# Patient Record
Sex: Male | Born: 1985 | Hispanic: Yes | Smoking: Former smoker
Health system: Southern US, Community
[De-identification: ages and names within clinical notes are randomized; demographics above are authoritative.]

## PROBLEM LIST (undated history)

## (undated) DIAGNOSIS — E119 Type 2 diabetes mellitus without complications: Secondary | ICD-10-CM

## (undated) HISTORY — PX: MOUTH SURGERY: SHX715

---

## 2012-04-02 ENCOUNTER — Emergency Department (HOSPITAL_COMMUNITY)
Admission: EM | Admit: 2012-04-02 | Discharge: 2012-04-03 | Disposition: A | Discharge status: Home / Self Care | Payer: Self-pay | Attending: Emergency Medicine | Admitting: Emergency Medicine

## 2012-04-02 DIAGNOSIS — R739 Hyperglycemia, unspecified: Secondary | ICD-10-CM

## 2012-04-02 DIAGNOSIS — K0889 Other specified disorders of teeth and supporting structures: Secondary | ICD-10-CM

## 2012-04-02 DIAGNOSIS — K089 Disorder of teeth and supporting structures, unspecified: Secondary | ICD-10-CM | POA: Insufficient documentation

## 2012-04-02 DIAGNOSIS — Z794 Long term (current) use of insulin: Secondary | ICD-10-CM | POA: Insufficient documentation

## 2012-04-02 DIAGNOSIS — R350 Frequency of micturition: Secondary | ICD-10-CM | POA: Insufficient documentation

## 2012-04-02 DIAGNOSIS — F17200 Nicotine dependence, unspecified, uncomplicated: Secondary | ICD-10-CM | POA: Insufficient documentation

## 2012-04-02 DIAGNOSIS — E1169 Type 2 diabetes mellitus with other specified complication: Secondary | ICD-10-CM | POA: Insufficient documentation

## 2012-04-02 HISTORY — DX: Type 2 diabetes mellitus without complications: E11.9

## 2012-04-03 ENCOUNTER — Encounter (HOSPITAL_COMMUNITY): Payer: Self-pay | Admitting: Emergency Medicine

## 2012-04-03 LAB — COMPREHENSIVE METABOLIC PANEL
ALT: 31 U/L (ref 0–53)
AST: 35 U/L (ref 0–37)
Albumin: 4.3 g/dL (ref 3.5–5.2)
Alkaline Phosphatase: 113 U/L (ref 39–117)
GFR calc Af Amer: >90 mL/min (ref >90)
Glucose, Bld: 505 mg/dL — ABNORMAL HIGH (ref 70–99)
Potassium: 4.3 mEq/L (ref 3.5–5.1)
Sodium: 133 mEq/L — ABNORMAL LOW (ref 135–145)
Total Protein: 7.6 g/dL (ref 6.0–8.3)

## 2012-04-03 LAB — CBC
Hemoglobin: 14.1 g/dL (ref 13.0–17.0)
MCHC: 35.5 g/dL (ref 30.0–36.0)

## 2012-04-03 LAB — URINALYSIS, ROUTINE W REFLEX MICROSCOPIC
Bilirubin Urine: NEGATIVE
Glucose, UA: >1000 mg/dL — AB
Hgb urine dipstick: NEGATIVE
Ketones, ur: 40 mg/dL — AB
pH: 6.5 (ref 5.0–8.0)

## 2012-04-03 LAB — GLUCOSE, CAPILLARY
Glucose-Capillary: 305 mg/dL — ABNORMAL HIGH (ref 70–99)
Glucose-Capillary: 481 mg/dL — ABNORMAL HIGH (ref 70–99)
Glucose-Capillary: 484 mg/dL — ABNORMAL HIGH (ref 70–99)

## 2012-04-03 MED ORDER — BUPIVACAINE HCL (PF) 0.5 % IJ SOLN
INTRAMUSCULAR | Status: AC
  Administered 2012-04-03: 50 mg
  Filled 2012-04-03: qty 30

## 2012-04-03 MED ORDER — PENICILLIN V POTASSIUM 500 MG PO TABS: 500.0000 mg | ORAL_TABLET | Freq: Four times a day (QID) | ORAL | Status: AC

## 2012-04-03 MED ORDER — IBUPROFEN 800 MG PO TABS: 800.0000 mg | ORAL_TABLET | Freq: Three times a day (TID) | ORAL | Status: DC

## 2012-04-03 MED ORDER — SODIUM CHLORIDE 0.9 % IV BOLUS (SEPSIS)
1000.0000 mL | Freq: Once | INTRAVENOUS | Status: AC
  Administered 2012-04-03: 1000 mL via INTRAVENOUS

## 2012-04-03 MED ORDER — INSULIN ASPART 100 UNIT/ML ~~LOC~~ SOLN
10.0000 [IU] | Freq: Once | SUBCUTANEOUS | Status: AC
  Administered 2012-04-03: 10 [IU] via SUBCUTANEOUS

## 2012-04-03 MED ORDER — HYDROCODONE-ACETAMINOPHEN 7.5-500 MG/15ML PO SOLN: 15.0000 mL | Freq: Four times a day (QID) | ORAL | Status: DC | PRN

## 2012-04-03 MED ORDER — METFORMIN HCL 500 MG PO TABS: 500.0000 mg | ORAL_TABLET | Freq: Every day | ORAL | Status: DC

## 2012-04-03 NOTE — ED Notes (Signed)
Pt states he has been having sxs of hyperglycemia for the past few days: dry mouth, light headedness increased urination

## 2012-04-03 NOTE — ED Provider Notes (Signed)
History     CSN: [id number]  Arrival date & time 04/02/12  2316   First MD Initiated Contact with Patient 04/03/12 0040      Chief Complaint  Patient presents with  . Hyperglycemia    (Consider location/radiation/quality/duration/timing/severity/associated sxs/prior treatment) HPI Hx per PT. is a diabetic and is out of metformin last month. He presents tonight with elevated blood sugar at home and increased urination. No fevers or chills. No nausea or vomiting. No abdominal pain. Symptoms moderate in severity. No known aggravating or alleviating factors. No local primary care physician. Is from [place]. Past Medical History  Diagnosis Date  . Diabetes mellitus without complication     History reviewed. No pertinent past surgical history.  Family History  Problem Relation Age of Onset  . Stroke Father   . CAD Other     History  Substance Use Topics  . Smoking status: Current Every Day Smoker    Types: Cigarettes  . Smokeless tobacco: Not on file  . Alcohol Use: Yes     Comment: social      Review of Systems  Constitutional: Negative for fever and chills.  HENT: Negative for neck pain and neck stiffness.   Eyes: Negative for pain.  Respiratory: Negative for shortness of breath.   Cardiovascular: Negative for chest pain.  Gastrointestinal: Negative for abdominal pain.  Genitourinary: Positive for frequency. Negative for dysuria and hematuria.  Musculoskeletal: Negative for back pain.  Skin: Negative for rash.  Neurological: Negative for headaches.  All other systems reviewed and are negative.    Allergies  Review of patient's allergies indicates no known allergies.  Home Medications   Current Outpatient Rx  Name  Route  Sig  Dispense  Refill  . INSULIN DETEMIR 100 UNIT/ML [place] SOLN   Subcutaneous   Inject 50 Units into the skin daily.         Marland Kitchen METFORMIN HCL 500 MG PO TABS   Oral   Take 500 mg by mouth daily with breakfast.           BP  123/70  Pulse 65  Temp 98.1 F (36.7 C) (Oral)  Resp 18  SpO2 100%  Physical Exam  Constitutional: He is oriented to person, place, and time. He appears well-developed and well-nourished.  HENT:  Head: Normocephalic and atraumatic.       Left lower first molar tenderness. No trismus. No gingival swelling. Uvula midline. Mildly dry mucous membranes  Eyes: Conjunctivae normal and EOM are normal. Pupils are equal, round, and reactive to light.  Neck: Trachea normal. Neck supple. No thyromegaly present.  Cardiovascular: Normal rate, regular rhythm, S1 normal, S2 normal and normal pulses.     No systolic murmur is present   No diastolic murmur is present  Pulses:      Radial pulses are 2+ on the right side, and 2+ on the left side.  Pulmonary/Chest: Effort normal and breath sounds normal. He has no wheezes. He has no rhonchi. He has no rales. He exhibits no tenderness.  Abdominal: Soft. Normal appearance and bowel sounds are normal. There is no tenderness. There is no CVA tenderness and negative Murphy's sign.  Musculoskeletal:       Moves all extremities x4 without clubbing cyanosis or edema  Neurological: He is alert and oriented to person, place, and time. He has normal strength. No cranial nerve deficit or sensory deficit. GCS eye subscore is 4. GCS verbal subscore is 5. GCS motor subscore is 6.  Skin: Skin is warm and dry. No rash noted. He is not diaphoretic.  Psychiatric: His speech is normal.       Cooperative and appropriate    ED Course  Dental Date/Time: 04/03/2012 2:36 AM Performed by: Sunnie Nielsen Authorized by: Sunnie Nielsen Consent: Verbal consent obtained. Risks and benefits: risks, benefits and alternatives were discussed Consent given by: patient Patient understanding: patient states understanding of the procedure being performed Patient consent: the patient's understanding of the procedure matches consent given Procedure consent: procedure consent matches procedure  scheduled Required items: required blood products, implants, devices, and special equipment available Patient identity confirmed: verbally with patient Time out: Immediately prior to procedure a "time out" was called to verify the correct patient, procedure, equipment, support staff and site/side marked as required. Preparation: Patient was prepped and draped in the usual sterile fashion. Local anesthesia used: yes Anesthesia: local infiltration Local anesthetic: bupivacaine 0.5% without epinephrine Anesthetic total: 1.8 ml Patient sedated: no Patient tolerance: Patient tolerated the procedure well with no immediate complications. Comments: Pain improved   (including critical care time)  Results for orders placed during the hospital encounter of 04/02/12  GLUCOSE, CAPILLARY      Component Value Range   Glucose-Capillary 484 (*) 70 - 99 mg/dL  CBC      Component Value Range   WBC 7.5  4.0 - 10.5 K/uL   RBC 4.80  4.22 - 5.81 MIL/uL   Hemoglobin 14.1  13.0 - 17.0 g/dL   HCT 40.9  81.1 - 91.4 %   MCV 82.7  78.0 - 100.0 fL   MCH 29.4  26.0 - 34.0 pg   MCHC 35.5  30.0 - 36.0 g/dL   RDW 78.2  95.6 - 21.3 %   Platelets 197  150 - 400 K/uL  COMPREHENSIVE METABOLIC PANEL      Component Value Range   Sodium 133 (*) 135 - 145 mEq/L   Potassium 4.3  3.5 - 5.1 mEq/L   Chloride 96  96 - 112 mEq/L   CO2 26  19 - 32 mEq/L   Glucose, Bld 505 (*) 70 - 99 mg/dL   BUN 14  6 - 23 mg/dL   Creatinine, Ser 0.86  0.50 - 1.35 mg/dL   Calcium 9.7  8.4 - 57.8 mg/dL   Total Protein 7.6  6.0 - 8.3 g/dL   Albumin 4.3  3.5 - 5.2 g/dL   AST 35  0 - 37 U/L   ALT 31  0 - 53 U/L   Alkaline Phosphatase 113  39 - 117 U/L   Total Bilirubin 0.5  0.3 - 1.2 mg/dL   GFR calc non Af Amer >90  >90 mL/min   GFR calc Af Amer >90  >90 mL/min  URINALYSIS, ROUTINE W REFLEX MICROSCOPIC      Component Value Range   Color, Urine YELLOW  YELLOW   APPearance CLEAR  CLEAR   Specific Gravity, Urine >1.046 (*) 1.005 -  1.030   pH 6.5  5.0 - 8.0   Glucose, UA >1000 (*) NEGATIVE mg/dL   Hgb urine dipstick NEGATIVE  NEGATIVE   Bilirubin Urine NEGATIVE  NEGATIVE   Ketones, ur 40 (*) NEGATIVE mg/dL   Protein, ur NEGATIVE  NEGATIVE mg/dL   Urobilinogen, UA 0.2  0.0 - 1.0 mg/dL   Nitrite NEGATIVE  NEGATIVE   Leukocytes, UA NEGATIVE  NEGATIVE  URINE MICROSCOPIC-ADD ON      Component Value Range   Squamous Epithelial / LPF RARE  RARE   WBC, UA 0-2  <3 WBC/hpf  GLUCOSE, CAPILLARY      Component Value Range   Glucose-Capillary 305 (*) 70 - 99 mg/dL   Comment 1 Documented in Chart     Comment 2 Notify RN     IVFs x 2L [place] Insulin  Recheck at 2:26 AM blood sugar improving, now has L lower dental pain and tenderness to first molar. Dental block performed. Antibiotics, prescription for metformin and referral to primary care physician.     MDM  Hyperglycemia in a diabetic with noncompliant with medications. Prescription for metformin provided with local primary care referrals. Precautions verbalizes understood.  Patient also with dental pain improved with dental block. Prescription for antibiotics and pain medications provided.DDS referral  Labs reviewed as above. IV fluids provided. Vital signs nursing notes reviewed.        Sunnie Nielsen, MD 04/03/12 [phone]

## 2012-04-03 NOTE — ED Notes (Signed)
Pt's CBG at this time = 484.

## 2012-04-21 ENCOUNTER — Encounter (HOSPITAL_COMMUNITY): Payer: Self-pay | Admitting: *Deleted

## 2012-04-21 ENCOUNTER — Emergency Department (HOSPITAL_COMMUNITY)
Admission: EM | Admit: 2012-04-21 | Discharge: 2012-04-22 | Disposition: A | Discharge status: Home / Self Care | Payer: Self-pay | Attending: Emergency Medicine | Admitting: Emergency Medicine

## 2012-04-21 DIAGNOSIS — R739 Hyperglycemia, unspecified: Secondary | ICD-10-CM

## 2012-04-21 DIAGNOSIS — R350 Frequency of micturition: Secondary | ICD-10-CM | POA: Insufficient documentation

## 2012-04-21 DIAGNOSIS — Z794 Long term (current) use of insulin: Secondary | ICD-10-CM | POA: Insufficient documentation

## 2012-04-21 DIAGNOSIS — F17200 Nicotine dependence, unspecified, uncomplicated: Secondary | ICD-10-CM | POA: Insufficient documentation

## 2012-04-21 DIAGNOSIS — F1210 Cannabis abuse, uncomplicated: Secondary | ICD-10-CM | POA: Insufficient documentation

## 2012-04-21 DIAGNOSIS — Z79899 Other long term (current) drug therapy: Secondary | ICD-10-CM | POA: Insufficient documentation

## 2012-04-21 DIAGNOSIS — E1169 Type 2 diabetes mellitus with other specified complication: Secondary | ICD-10-CM | POA: Insufficient documentation

## 2012-04-21 LAB — CBC WITH DIFFERENTIAL/PLATELET
Basophils Relative: 1 % (ref 0–1)
Eosinophils Absolute: 0 10*3/uL (ref 0.0–0.7)
Eosinophils Relative: 0 % (ref 0–5)
MCH: 30.6 pg (ref 26.0–34.0)
MCHC: 36.3 g/dL — ABNORMAL HIGH (ref 30.0–36.0)
Neutrophils Relative %: 59 % (ref 43–77)
Platelets: 165 10*3/uL (ref 150–400)

## 2012-04-21 LAB — URINALYSIS, ROUTINE W REFLEX MICROSCOPIC
Bilirubin Urine: NEGATIVE
Nitrite: NEGATIVE
Protein, ur: NEGATIVE mg/dL
Specific Gravity, Urine: 1.045 — ABNORMAL HIGH (ref 1.005–1.030)
Urobilinogen, UA: 0.2 mg/dL (ref 0.0–1.0)

## 2012-04-21 LAB — BASIC METABOLIC PANEL
BUN: 24 mg/dL — ABNORMAL HIGH (ref 6–23)
Calcium: 9.7 mg/dL (ref 8.4–10.5)
GFR calc Af Amer: >90 mL/min (ref >90)
GFR calc non Af Amer: >90 mL/min (ref >90)
Potassium: 4.2 mEq/L (ref 3.5–5.1)
Sodium: 128 mEq/L — ABNORMAL LOW (ref 135–145)

## 2012-04-21 LAB — GLUCOSE, CAPILLARY: Glucose-Capillary: 342 mg/dL — ABNORMAL HIGH (ref 70–99)

## 2012-04-21 MED ORDER — SODIUM CHLORIDE 0.9 % IV BOLUS (SEPSIS)
1000.0000 mL | Freq: Once | INTRAVENOUS | Status: AC
  Administered 2012-04-21: 1000 mL via INTRAVENOUS

## 2012-04-21 MED ORDER — INSULIN DETEMIR 100 UNIT/ML ~~LOC~~ SOLN
15.0000 [IU] | Freq: Once | SUBCUTANEOUS | Status: AC
  Administered 2012-04-21: 15 [IU] via SUBCUTANEOUS
  Filled 2012-04-21: qty 10

## 2012-04-21 NOTE — ED Notes (Signed)
Pt presents w/ c/o hyperglycemia. Pt has DM I and is out of normal insulin. Pt c/o recent significant weight loss. Pt c/o s/s consistent w/ past experience of developing complications from uncontrolled blood sugar.

## 2012-04-21 NOTE — ED Provider Notes (Signed)
History     CSN: [id number]  Arrival date & time 04/21/12  1946   First MD Initiated Contact with Patient 04/21/12 2012      Chief Complaint  Patient presents with  . Hyperglycemia   HPI  History provided by the patient. Patient is a 26 year old male with history of diabetes who presents with concerns for elevated blood sugar. Patient is originally from the [place] area he suddenly relocated to [place] has not established primary care followup. Patient reports having elevated blood sugars recently. He had an emergency room visit for which she had new prescriptions for his metformin given. Patient has been taking this and has normal dose of Levemir daily but states that he ran out of his last dose was May last night. Patient states blood sugar had reached 500 level. He used only 9 units of Levemir compared to his normal 15. Today blood sugar has continued to be elevated despite taking metformin. Patient also reports feeling slightly "off" today with no specific symptoms. He denies any fever, chills or sweats. Denies any nausea vomiting or diarrhea. Patient does state that he hopes to find a primary care provider tomorrow during the day.    Past Medical History  Diagnosis Date  . Diabetes mellitus without complication     History reviewed. No pertinent past surgical history.  Family History  Problem Relation Age of Onset  . Stroke Father   . CAD Other     History  Substance Use Topics  . Smoking status: Current Every Day Smoker    Types: Cigarettes  . Smokeless tobacco: Not on file  . Alcohol Use: Yes     Comment: social      Review of Systems  Constitutional: Negative for fever, chills and diaphoresis.  Respiratory: Negative for cough and shortness of breath.   Cardiovascular: Negative for chest pain.  Gastrointestinal: Negative for nausea, vomiting and diarrhea.  Genitourinary: Positive for frequency. Negative for dysuria.  All other systems reviewed and are  negative.    Allergies  Review of patient's allergies indicates no known allergies.  Home Medications   Current Outpatient Rx  Name  Route  Sig  Dispense  Refill  . HYDROCODONE-ACETAMINOPHEN 7.5-500 MG/15ML PO SOLN   Oral   Take 15 mLs by mouth every 6 (six) hours as needed for pain.   60 mL   0   . IBUPROFEN 800 MG PO TABS   Oral   Take 1 tablet (800 mg total) by mouth 3 (three) times daily.   21 tablet   0   . INSULIN DETEMIR 100 UNIT/ML [place] SOLN   Subcutaneous   Inject 15 Units into the skin daily.          Marland Kitchen METFORMIN HCL 500 MG PO TABS   Oral   Take 500 mg by mouth daily with breakfast.           BP 131/77  Pulse 103  Temp 98.3 F (36.8 C) (Oral)  Resp 20  SpO2 100%  Physical Exam  Nursing note and vitals reviewed. Constitutional: He is oriented to person, place, and time. He appears well-developed and well-nourished. No distress.  HENT:  Head: Normocephalic.  Cardiovascular: Normal rate and regular rhythm.   No murmur heard. Pulmonary/Chest: Effort normal and breath sounds normal. No respiratory distress. He has no wheezes. He has no rales.  Abdominal: Soft.  Neurological: He is alert and oriented to person, place, and time.  Skin: Skin is warm.  Psychiatric: He has a normal mood and affect. His behavior is normal.    ED Course  Procedures   Results for orders placed during the hospital encounter of 04/21/12  GLUCOSE, CAPILLARY      Component Value Range   Glucose-Capillary 342 (*) 70 - 99 mg/dL   Comment 1 Notify RN    CBC WITH DIFFERENTIAL      Component Value Range   WBC 9.6  4.0 - 10.5 K/uL   RBC 4.61  4.22 - 5.81 MIL/uL   Hemoglobin 14.1  13.0 - 17.0 g/dL   HCT 40.9 (*) 81.1 - 91.4 %   MCV 84.2  78.0 - 100.0 fL   MCH 30.6  26.0 - 34.0 pg   MCHC 36.3 (*) 30.0 - 36.0 g/dL   RDW 78.2  95.6 - 21.3 %   Platelets 165  150 - 400 K/uL   Neutrophils Relative 59  43 - 77 %   Neutro Abs 5.7  1.7 - 7.7 K/uL   Lymphocytes Relative 33  12 -  46 %   Lymphs Abs 3.2  0.7 - 4.0 K/uL   Monocytes Relative 7  3 - 12 %   Monocytes Absolute 0.7  0.1 - 1.0 K/uL   Eosinophils Relative 0  0 - 5 %   Eosinophils Absolute 0.0  0.0 - 0.7 K/uL   Basophils Relative 1  0 - 1 %   Basophils Absolute 0.1  0.0 - 0.1 K/uL  BASIC METABOLIC PANEL      Component Value Range   Sodium 128 (*) 135 - 145 mEq/L   Potassium 4.2  3.5 - 5.1 mEq/L   Chloride 89 (*) 96 - 112 mEq/L   CO2 23  19 - 32 mEq/L   Glucose, Bld 322 (*) 70 - 99 mg/dL   BUN 24 (*) 6 - 23 mg/dL   Creatinine, Ser 0.86  0.50 - 1.35 mg/dL   Calcium 9.7  8.4 - 57.8 mg/dL   GFR calc non Af Amer >90  >90 mL/min   GFR calc Af Amer >90  >90 mL/min  URINALYSIS, ROUTINE W REFLEX MICROSCOPIC      Component Value Range   Color, Urine YELLOW  YELLOW   APPearance CLEAR  CLEAR   Specific Gravity, Urine 1.045 (*) 1.005 - 1.030   pH 5.0  5.0 - 8.0   Glucose, UA >1000 (*) NEGATIVE mg/dL   Hgb urine dipstick NEGATIVE  NEGATIVE   Bilirubin Urine NEGATIVE  NEGATIVE   Ketones, ur >80 (*) NEGATIVE mg/dL   Protein, ur NEGATIVE  NEGATIVE mg/dL   Urobilinogen, UA 0.2  0.0 - 1.0 mg/dL   Nitrite NEGATIVE  NEGATIVE   Leukocytes, UA NEGATIVE  NEGATIVE  GLUCOSE, CAPILLARY      Component Value Range   Glucose-Capillary 267 (*) 70 - 99 mg/dL  URINE MICROSCOPIC-ADD ON      Component Value Range   WBC, UA 0-2  <3 WBC/hpf  GLUCOSE, CAPILLARY      Component Value Range   Glucose-Capillary 231 (*) 70 - 99 mg/dL   Comment 1 Documented in Chart     Comment 2 Notify RN    GLUCOSE, CAPILLARY      Component Value Range   Glucose-Capillary 210 (*) 70 - 99 mg/dL        1. Hyperglycemia       MDM  8:15 PM patient seen and evaluated. Patient sitting comfortably in no acute distress.  Sugars continue to improve. No  anion gap. Patient continues to look well clinically without signs concerning for DKA.  Sugar is now 210 patient continues to really well. Will give prescription for Levemir and instructed  patient to followup with PCP.      Angus Seller, [place] 04/22/12 [phone]

## 2012-04-22 LAB — GLUCOSE, CAPILLARY: Glucose-Capillary: 210 mg/dL — ABNORMAL HIGH (ref 70–99)

## 2012-04-22 MED ORDER — INSULIN DETEMIR 100 UNIT/ML ~~LOC~~ SOLN: 15.0000 [IU] | Freq: Every day | SUBCUTANEOUS | Status: DC

## 2012-04-24 NOTE — ED Provider Notes (Signed)
Medical screening examination/treatment/procedure(s) were performed by non-physician practitioner and as supervising physician I was immediately available for consultation/collaboration.   David H Yao, MD 04/24/12 0649 

## 2012-09-24 ENCOUNTER — Inpatient Hospital Stay (HOSPITAL_COMMUNITY)
Admission: EM | Admit: 2012-09-24 | Discharge: 2012-09-26 | DRG: 638 | Disposition: A | Discharge status: Home / Self Care | Payer: MEDICAID | Attending: Family Medicine | Admitting: Family Medicine

## 2012-09-24 ENCOUNTER — Encounter (HOSPITAL_COMMUNITY): Payer: Self-pay

## 2012-09-24 DIAGNOSIS — E119 Type 2 diabetes mellitus without complications: Secondary | ICD-10-CM

## 2012-09-24 DIAGNOSIS — Z91199 Patient's noncompliance with other medical treatment and regimen due to unspecified reason: Secondary | ICD-10-CM

## 2012-09-24 DIAGNOSIS — E1010 Type 1 diabetes mellitus with ketoacidosis without coma: Principal | ICD-10-CM | POA: Diagnosis present

## 2012-09-24 DIAGNOSIS — E876 Hypokalemia: Secondary | ICD-10-CM

## 2012-09-24 DIAGNOSIS — Z9119 Patient's noncompliance with other medical treatment and regimen: Secondary | ICD-10-CM

## 2012-09-24 DIAGNOSIS — R5383 Other fatigue: Secondary | ICD-10-CM | POA: Diagnosis present

## 2012-09-24 DIAGNOSIS — R5381 Other malaise: Secondary | ICD-10-CM | POA: Diagnosis present

## 2012-09-24 DIAGNOSIS — B370 Candidal stomatitis: Secondary | ICD-10-CM

## 2012-09-24 DIAGNOSIS — E1110 Type 2 diabetes mellitus with ketoacidosis without coma: Secondary | ICD-10-CM

## 2012-09-24 LAB — BASIC METABOLIC PANEL
BUN: 8 mg/dL (ref 6–23)
CO2: 19 mEq/L (ref 19–32)
Calcium: 8.8 mg/dL (ref 8.4–10.5)
Chloride: 104 mEq/L (ref 96–112)
Creatinine, Ser: 0.62 mg/dL (ref 0.50–1.35)
Creatinine, Ser: 0.61 mg/dL (ref 0.50–1.35)
GFR calc Af Amer: >90 mL/min (ref >90)
GFR calc Af Amer: >90 mL/min (ref >90)
GFR calc non Af Amer: >90 mL/min (ref >90)
GFR calc non Af Amer: >90 mL/min (ref >90)
Sodium: 138 mEq/L (ref 135–145)

## 2012-09-24 LAB — MAGNESIUM: Magnesium: 1.6 mg/dL (ref 1.5–2.5)

## 2012-09-24 LAB — CBC WITH DIFFERENTIAL/PLATELET
Basophils Absolute: 0 10*3/uL (ref 0.0–0.1)
Eosinophils Relative: 0 % (ref 0–5)
HCT: 42.7 % (ref 39.0–52.0)
Lymphocytes Relative: 19 % (ref 12–46)
Lymphs Abs: 1.5 10*3/uL (ref 0.7–4.0)
MCV: 85.2 fL (ref 78.0–100.0)
Neutro Abs: 5.6 10*3/uL (ref 1.7–7.7)
Platelets: 261 10*3/uL (ref 150–400)
RBC: 5.01 MIL/uL (ref 4.22–5.81)
WBC: 7.7 10*3/uL (ref 4.0–10.5)

## 2012-09-24 LAB — BLOOD GAS, ARTERIAL
FIO2: 0.21 %
Patient temperature: 98.6
pCO2 arterial: 18 mmHg — CL (ref 35.0–45.0)
pO2, Arterial: 123 mmHg — ABNORMAL HIGH (ref 80.0–100.0)

## 2012-09-24 LAB — URINE MICROSCOPIC-ADD ON

## 2012-09-24 LAB — COMPREHENSIVE METABOLIC PANEL
ALT: 12 U/L (ref 0–53)
AST: 13 U/L (ref 0–37)
Alkaline Phosphatase: 112 U/L (ref 39–117)
CO2: 7 mEq/L — CL (ref 19–32)
Calcium: 9.6 mg/dL (ref 8.4–10.5)
Chloride: 98 mEq/L (ref 96–112)
GFR calc Af Amer: >90 mL/min (ref >90)
GFR calc non Af Amer: >90 mL/min (ref >90)
Glucose, Bld: 339 mg/dL — ABNORMAL HIGH (ref 70–99)
Sodium: 133 mEq/L — ABNORMAL LOW (ref 135–145)
Total Bilirubin: 0.3 mg/dL (ref 0.3–1.2)

## 2012-09-24 LAB — GLUCOSE, CAPILLARY
Glucose-Capillary: 256 mg/dL — ABNORMAL HIGH (ref 70–99)
Glucose-Capillary: 227 mg/dL — ABNORMAL HIGH (ref 70–99)
Glucose-Capillary: 231 mg/dL — ABNORMAL HIGH (ref 70–99)
Glucose-Capillary: 245 mg/dL — ABNORMAL HIGH (ref 70–99)
Glucose-Capillary: 241 mg/dL — ABNORMAL HIGH (ref 70–99)
Glucose-Capillary: 161 mg/dL — ABNORMAL HIGH (ref 70–99)
Glucose-Capillary: 171 mg/dL — ABNORMAL HIGH (ref 70–99)
Glucose-Capillary: 188 mg/dL — ABNORMAL HIGH (ref 70–99)

## 2012-09-24 LAB — URINALYSIS, ROUTINE W REFLEX MICROSCOPIC
Bilirubin Urine: NEGATIVE
Glucose, UA: >1000 mg/dL — AB
Ketones, ur: >80 mg/dL — AB
Protein, ur: 30 mg/dL — AB
Urobilinogen, UA: 0.2 mg/dL (ref 0.0–1.0)

## 2012-09-24 LAB — TROPONIN I
Troponin I: <0.3 ng/mL (ref <0.30)
Troponin I: <0.3 ng/mL (ref <0.30)

## 2012-09-24 LAB — MRSA PCR SCREENING: MRSA by PCR: NEGATIVE

## 2012-09-24 MED ORDER — POTASSIUM CHLORIDE 10 MEQ/100ML IV SOLN
10.0000 meq | INTRAVENOUS | Status: AC
  Administered 2012-09-24 – 2012-09-25 (×4): 10 meq via INTRAVENOUS
  Filled 2012-09-24: qty 100
  Filled 2012-09-24: qty 500

## 2012-09-24 MED ORDER — ACETAMINOPHEN 325 MG PO TABS: 650.0000 mg | ORAL_TABLET | Freq: Four times a day (QID) | ORAL | Status: DC | PRN

## 2012-09-24 MED ORDER — INSULIN REGULAR BOLUS VIA INFUSION
0.0000 [IU] | Freq: Three times a day (TID) | INTRAVENOUS | Status: DC
  Administered 2012-09-24: 6.7 [IU] via INTRAVENOUS
  Filled 2012-09-24: qty 10

## 2012-09-24 MED ORDER — DEXTROSE-NACL 5-0.45 % IV SOLN
INTRAVENOUS | Status: DC
  Administered 2012-09-24 (×2): via INTRAVENOUS

## 2012-09-24 MED ORDER — SODIUM CHLORIDE 0.9 % IV SOLN
INTRAVENOUS | Status: DC
  Administered 2012-09-24: 5.1 [IU]/h via INTRAVENOUS
  Administered 2012-09-24: 7.2 [IU]/h via INTRAVENOUS
  Administered 2012-09-24: 7.3 [IU]/h via INTRAVENOUS
  Administered 2012-09-24: 2 [IU]/h via INTRAVENOUS
  Administered 2012-09-25: 01:00:00 via INTRAVENOUS
  Filled 2012-09-24 (×2): qty 1

## 2012-09-24 MED ORDER — SODIUM CHLORIDE 0.9 % IJ SOLN
3.0000 mL | Freq: Two times a day (BID) | INTRAMUSCULAR | Status: DC
  Administered 2012-09-24 – 2012-09-25 (×3): 3 mL via INTRAVENOUS

## 2012-09-24 MED ORDER — HEPARIN SODIUM (PORCINE) 5000 UNIT/ML IJ SOLN
5000.0000 [IU] | Freq: Three times a day (TID) | INTRAMUSCULAR | Status: DC
  Administered 2012-09-24 – 2012-09-25 (×3): 5000 [IU] via SUBCUTANEOUS
  Filled 2012-09-24 (×6): qty 1

## 2012-09-24 MED ORDER — SODIUM CHLORIDE 0.9 % IJ SOLN: 3.0000 mL | INTRAMUSCULAR | Status: DC | PRN

## 2012-09-24 MED ORDER — SODIUM CHLORIDE 0.9 % IV SOLN: 250.0000 mL | INTRAVENOUS | Status: DC | PRN

## 2012-09-24 MED ORDER — ACETAMINOPHEN 650 MG RE SUPP: 650.0000 mg | Freq: Four times a day (QID) | RECTAL | Status: DC | PRN

## 2012-09-24 MED ORDER — SODIUM CHLORIDE 0.9 % IV BOLUS (SEPSIS)
1000.0000 mL | Freq: Once | INTRAVENOUS | Status: AC
  Administered 2012-09-24: 1000 mL via INTRAVENOUS

## 2012-09-24 MED ORDER — ONDANSETRON HCL 4 MG PO TABS: 4.0000 mg | ORAL_TABLET | Freq: Four times a day (QID) | ORAL | Status: DC | PRN

## 2012-09-24 MED ORDER — NYSTATIN 100000 UNIT/ML MT SUSP
5.0000 mL | Freq: Four times a day (QID) | OROMUCOSAL | Status: DC
  Administered 2012-09-24 – 2012-09-26 (×7): 500000 [IU] via ORAL
  Filled 2012-09-24 (×11): qty 5

## 2012-09-24 MED ORDER — DEXTROSE 50 % IV SOLN: 25.0000 mL | INTRAVENOUS | Status: DC | PRN

## 2012-09-24 MED ORDER — OXYCODONE HCL 5 MG PO TABS: 5.0000 mg | ORAL_TABLET | ORAL | Status: DC | PRN

## 2012-09-24 MED ORDER — HYDROMORPHONE HCL PF 1 MG/ML IJ SOLN: 0.5000 mg | INTRAMUSCULAR | Status: DC | PRN

## 2012-09-24 MED ORDER — SODIUM CHLORIDE 0.9 % IV SOLN
INTRAVENOUS | Status: DC
  Administered 2012-09-24 (×2): via INTRAVENOUS

## 2012-09-24 MED ORDER — PNEUMOCOCCAL VAC POLYVALENT 25 MCG/0.5ML IJ INJ
0.5000 mL | INJECTION | INTRAMUSCULAR | Status: AC
  Administered 2012-09-25: 0.5 mL via INTRAMUSCULAR
  Filled 2012-09-24 (×2): qty 0.5

## 2012-09-24 MED ORDER — ONDANSETRON HCL 4 MG/2ML IJ SOLN: 4.0000 mg | Freq: Four times a day (QID) | INTRAMUSCULAR | Status: DC | PRN

## 2012-09-24 MED ORDER — SODIUM BICARBONATE 8.4 % IV SOLN
50.0000 meq | Freq: Once | INTRAVENOUS | Status: AC
  Administered 2012-09-24: 50 meq via INTRAVENOUS
  Filled 2012-09-24: qty 50

## 2012-09-24 NOTE — ED Notes (Signed)
MD made aware CO2:7

## 2012-09-24 NOTE — Progress Notes (Signed)
[facility] has seen patient and provided him with a OC application. ° °

## 2012-09-24 NOTE — ED Notes (Signed)
Patient has a hx of DM. Last checked sugar about a weak ago-540. Patient takes Levimir on a sliding scale. Last took insulin  2weeks ago. C/O dizziness and nauseous and HA. HA is a 4/10.

## 2012-09-24 NOTE — ED Provider Notes (Signed)
History     CSN: [id number]  Arrival date & time 09/24/12  0910   First MD Initiated Contact with Patient 09/24/12 0919      No chief complaint on file.   (Consider location/radiation/quality/duration/timing/severity/associated sxs/prior treatment) HPI Comments: Patient comes to the emergency department for evaluation of weakness and dizziness. Patient reports that his symptoms have been present for several days. He is an insulin-dependent diabetic. He has been out of his insulin for approximately one week. He also has run out of his Glucophage. Patient denies abdominal pain, nausea and vomiting. He has not had any diarrhea. There has been no fever. Patient denies upper respiratory infection symptoms. In addition to the dizziness he has a mild to moderate headache.   Past Medical History  Diagnosis Date  . Diabetes mellitus without complication     No past surgical history on file.  Family History  Problem Relation Age of Onset  . Stroke Father   . CAD Other     History  Substance Use Topics  . Smoking status: Current Every Day Smoker    Types: Cigarettes  . Smokeless tobacco: Not on file  . Alcohol Use: Yes     Comment: social      Review of Systems  Constitutional: Positive for fatigue. Negative for fever.  Respiratory: Negative for shortness of breath.   Cardiovascular: Negative for chest pain.  Neurological: Positive for dizziness.  All other systems reviewed and are negative.    Allergies  Review of patient's allergies indicates no known allergies.  Home Medications   Current Outpatient Rx  Name  Route  Sig  Dispense  Refill  . insulin detemir (LEVEMIR) 100 UNIT/ML injection   Subcutaneous   Inject 30 Units into the skin daily.         . metFORMIN (GLUCOPHAGE) 500 MG tablet   Oral   Take 500 mg by mouth daily with breakfast.           BP 128/77  Pulse 97  Temp(Src) 98.1 F (36.7 C) (Oral)  Resp 16  SpO2 100%  Physical Exam   Constitutional: He is oriented to person, place, and time. He appears well-developed and well-nourished. No distress.  HENT:  Head: Normocephalic and atraumatic.  Right Ear: Hearing normal.  Left Ear: Hearing normal.  Nose: Nose normal.  Mouth/Throat: Oropharynx is clear and moist and mucous membranes are normal.  Eyes: Conjunctivae and EOM are normal. Pupils are equal, round, and reactive to light.  Neck: Normal range of motion. Neck supple.  Cardiovascular: Regular rhythm, S1 normal and S2 normal.  Exam reveals no gallop and no friction rub.   No murmur heard. Pulmonary/Chest: Effort normal and breath sounds normal. No respiratory distress. He exhibits no tenderness.  Abdominal: Soft. Normal appearance and bowel sounds are normal. There is no hepatosplenomegaly. There is no tenderness. There is no rebound, no guarding, no tenderness at McBurney's point and negative Murphy's sign. No hernia.  Musculoskeletal: Normal range of motion.  Neurological: He is alert and oriented to person, place, and time. He has normal strength. No cranial nerve deficit or sensory deficit. Coordination normal. GCS eye subscore is 4. GCS verbal subscore is 5. GCS motor subscore is 6.  Skin: Skin is warm, dry and intact. No rash noted. No cyanosis.  Psychiatric: He has a normal mood and affect. His speech is normal and behavior is normal. Thought content normal.    ED Course  Procedures (including critical care time)  Labs Reviewed  CBC WITH DIFFERENTIAL - Abnormal; Notable for the following:    MCHC 36.3 (*)    All other components within normal limits  COMPREHENSIVE METABOLIC PANEL - Abnormal; Notable for the following:    Sodium 133 (*)    CO2 7 (*)    Glucose, Bld 339 (*)    All other components within normal limits  KETONES, QUALITATIVE - Abnormal; Notable for the following:    Acetone, Bld LARGE (*)    All other components within normal limits  URINALYSIS, ROUTINE W REFLEX MICROSCOPIC - Abnormal;  Notable for the following:    Specific Gravity, Urine 1.032 (*)    Glucose, UA >1000 (*)    Hgb urine dipstick TRACE (*)    Ketones, ur >80 (*)    Protein, ur 30 (*)    All other components within normal limits  BLOOD GAS, ARTERIAL - Abnormal; Notable for the following:    pH, Arterial 7.108 (*)    pCO2 arterial 18.0 (*)    pO2, Arterial 123.0 (*)    Bicarbonate 5.4 (*)    Acid-base deficit 23.7 (*)    All other components within normal limits  URINE MICROSCOPIC-ADD ON - Abnormal; Notable for the following:    Casts GRANULAR CAST (*)    All other components within normal limits  GLUCOSE, CAPILLARY - Abnormal; Notable for the following:    Glucose-Capillary 256 (*)    All other components within normal limits   No results found.   Diagnosis: Diabetic ketoacidosis    MDM  Patient presents to the ER for evaluation of generalized weakness and dizziness. Patient has diabetes, has been out of his medications for about a week. He has also been out of history, has not been able to sugar, last tested it one week ago it was 540. Patient appears weak, but has a nonfocal exam otherwise. Abdominal exam is benign. No signs of infection. Patient workup consistent with diabetic ketoacidosis with significant acidosis, pH 7.1. DKA secondary to medication noncompliance, does not appear to have a precipitating illness or infection. Patient will require hospitalization for asthma. He has been administered IV fluids here in the ER as well as IV insulin.        Gilda Crease, MD 09/24/12 1141

## 2012-09-24 NOTE — Progress Notes (Signed)
Utilization Review completed.  Ares Cardozo RN CM  

## 2012-09-24 NOTE — ED Notes (Signed)
Denies CP.

## 2012-09-24 NOTE — Progress Notes (Addendum)
Pt was seen by [facility] community liaison and given an application to assist with local self pay pcps Pt reports recent move from [place] to [place] Pt reports still having a pcp in [place] but none in [place] at this time

## 2012-09-24 NOTE — H&P (Signed)
PCP:   No primary provider on file.   Chief Complaint:  Generalized weakness, high blood glucose.   HPI: This is a 27 year old male, with known history of diabetes mellitus, on Metformin and Detemir insulin, and no other significant medical problems, who presents to the ED with progressive generalized weakness. According to patient, he was diagnosed with D<M about 1 year ago, while living in [place], where he had a PMD. He moved to [place] in 03/2012, and has not had a PMD since. He ran out of his medication about 2 weeks ago, and although he had refills that were still active, he could not obtain them, due to cost. About a week ago, he checked his CBG, and found his blood glucose to be as high as 540. Over the past 3-4 days, he has becnme progressively weak, with increased thirst and polyuria, and had been trying to drink a lot of fluids, to no avail. He has felt nauseated, but has had no vomiting, no abdominal pain, diarrhea, cough or chest pain. He came to the ED as he was feeling worse, and was found to have random blood glucose 339. ABG revealed PH 7.108, PCO2 18.0, Bicarb 5.4. He also had ketonuria.   Allergies:  No Known Allergies    Past Medical History  Diagnosis Date  . Diabetes mellitus without complication     History reviewed. No pertinent past surgical history.  Prior to Admission medications   Medication Sig Start Date End Date Taking? Authorizing Provider  insulin detemir (LEVEMIR) 100 UNIT/ML injection Inject 30 Units into the skin daily. 04/22/12  Yes Peter S Dammen, PA-C  metFORMIN (GLUCOPHAGE) 500 MG tablet Take 500 mg by mouth daily with breakfast.   Yes Historical Provider, MD    Social History: He smoked 0.00 packs per day. He has never used smokeless tobacco. He reports that  drinks alcohol. He reports that he uses illicit drugs (Marijuana).  Family History  Problem Relation Age of Onset  . Stroke Father   . CAD Other     Review of Systems:  As per HPI  and chief complaint. Patent has fatigue, diminished appetite, but denies weight loss, fever, chills. He has a headache, but no blurred vision, difficulty in speaking, dysphagia, chest pain, cough, shortness of breath, orthopnea, paroxysmal nocturnal dyspnea, diaphoresis, abdominal pain, vomiting, diarrhea, belching, heartburn, hematemesis, melena, dysuria, hematochezia, lower extremity swelling, pain, or redness. The rest of the systems review is negative.  Physical Exam:  General:  Patient does not appear to be in obvious acute distress. Alert, communicative, fully oriented, talking in complete sentences, not short of breath at rest. Has smell of Acetone on breath. HEENT:  No clinical pallor, no jaundice, no conjunctival injection or discharge. Buccal mucosa appears "dry" and oral thrush is noted.  NECK:  Supple, JVP not seen, no carotid bruits, no palpable lymphadenopathy, no palpable goiter. CHEST:  Clinically clear to auscultation, no wheezes, no crackles. HEART:  Sounds 1 and 2 heard, normal, regular, no murmurs. ABDOMEN:  Flat, soft, non-tender, no palpable organomegaly, no palpable masses, normal bowel sounds. GENITALIA:  Not examined. LOWER EXTREMITIES:  No pitting edema, palpable peripheral pulses. MUSCULOSKELETAL SYSTEM:  Unremarkable. CENTRAL NERVOUS SYSTEM:  No focal neurologic deficit on gross examination.  Labs on Admission:  Results for orders placed during the hospital encounter of 09/24/12 (from the past 48 hour(s))  CBC WITH DIFFERENTIAL     Status: Abnormal   Collection Time    09/24/12  9:45 AM  Result Value Range   WBC 7.7  4.0 - 10.5 K/uL   RBC 5.01  4.22 - 5.81 MIL/uL   Hemoglobin 15.5  13.0 - 17.0 g/dL   HCT 16.1  09.6 - 04.5 %   MCV 85.2  78.0 - 100.0 fL   MCH 30.9  26.0 - 34.0 pg   MCHC 36.3 (*) 30.0 - 36.0 g/dL   RDW 40.9  81.1 - 91.4 %   Platelets 261  150 - 400 K/uL   Neutrophils Relative % 72  43 - 77 %   Neutro Abs 5.6  1.7 - 7.7 K/uL   Lymphocytes  Relative 19  12 - 46 %   Lymphs Abs 1.5  0.7 - 4.0 K/uL   Monocytes Relative 8  3 - 12 %   Monocytes Absolute 0.6  0.1 - 1.0 K/uL   Eosinophils Relative 0  0 - 5 %   Eosinophils Absolute 0.0  0.0 - 0.7 K/uL   Basophils Relative 0  0 - 1 %   Basophils Absolute 0.0  0.0 - 0.1 K/uL  COMPREHENSIVE METABOLIC PANEL     Status: Abnormal   Collection Time    09/24/12  9:45 AM      Result Value Range   Sodium 133 (*) 135 - 145 mEq/L   Comment: REPEATED TO VERIFY   Potassium 3.7  3.5 - 5.1 mEq/L   Chloride 98  96 - 112 mEq/L   Comment: REPEATED TO VERIFY   CO2 7 (*) 19 - 32 mEq/L   Comment: REPEATED TO VERIFY     CRITICAL RESULT CALLED TO, READ BACK BY AND VERIFIED WITH:     T. JACKSON RN AT 1040 ON 05.27.14 BY SHUEA   Glucose, Bld 339 (*) 70 - 99 mg/dL   BUN 11  6 - 23 mg/dL   Creatinine, Ser 7.82  0.50 - 1.35 mg/dL   Calcium 9.6  8.4 - 95.6 mg/dL   Total Protein 8.3  6.0 - 8.3 g/dL   Albumin 4.1  3.5 - 5.2 g/dL   AST 13  0 - 37 U/L   ALT 12  0 - 53 U/L   Alkaline Phosphatase 112  39 - 117 U/L   Total Bilirubin 0.3  0.3 - 1.2 mg/dL   GFR calc non Af Amer >90  >90 mL/min   GFR calc Af Amer >90  >90 mL/min   Comment:            The eGFR has been calculated     using the CKD EPI equation.     This calculation has not been     validated in all clinical     situations.     eGFR's persistently     <90 mL/min signify     possible Chronic Kidney Disease.  KETONES, QUALITATIVE     Status: Abnormal   Collection Time    09/24/12  9:45 AM      Result Value Range   Acetone, Bld LARGE (*) NEGATIVE  URINALYSIS, ROUTINE W REFLEX MICROSCOPIC     Status: Abnormal   Collection Time    09/24/12 10:37 AM      Result Value Range   Color, Urine YELLOW  YELLOW   APPearance CLEAR  CLEAR   Specific Gravity, Urine 1.032 (*) 1.005 - 1.030   pH 5.0  5.0 - 8.0   Glucose, UA >1000 (*) NEGATIVE mg/dL   Hgb urine dipstick TRACE (*) NEGATIVE   Bilirubin Urine NEGATIVE  NEGATIVE   Ketones, ur >80 (*)  NEGATIVE mg/dL   Protein, ur 30 (*) NEGATIVE mg/dL   Urobilinogen, UA 0.2  0.0 - 1.0 mg/dL   Nitrite NEGATIVE  NEGATIVE   Leukocytes, UA NEGATIVE  NEGATIVE  URINE MICROSCOPIC-ADD ON     Status: Abnormal   Collection Time    09/24/12 10:37 AM      Result Value Range   Squamous Epithelial / LPF RARE  RARE   WBC, UA 0-2  <3 WBC/hpf   RBC / HPF 0-2  <3 RBC/hpf   Bacteria, UA RARE  RARE   Casts GRANULAR CAST (*) NEGATIVE  BLOOD GAS, ARTERIAL     Status: Abnormal   Collection Time    09/24/12 11:10 AM      Result Value Range   FIO2 0.21     Delivery systems ROOM AIR     pH, Arterial 7.108 (*) 7.350 - 7.450   Comment: CRITICAL RESULT CALLED TO, READ BACK BY AND VERIFIED WITH:     DR POLLINA,MD AT 1127 BY KENNAN DEPUE,RRT,RCP ON 09/24/12   pCO2 arterial 18.0 (*) 35.0 - 45.0 mmHg   Comment: CRITICAL RESULT CALLED TO, READ BACK BY AND VERIFIED WITH:     DR Orson Eva AT 1127 BY KENNEN DEPUE,RRT,RCP ON 09/24/12   pO2, Arterial 123.0 (*) 80.0 - 100.0 mmHg   Bicarbonate 5.4 (*) 20.0 - 24.0 mEq/L   TCO2 5.2  0 - 100 mmol/L   Acid-base deficit 23.7 (*) 0.0 - 2.0 mmol/L   O2 Saturation 97.2     Patient temperature 98.6     Collection site RIGHT RADIAL     Drawn by [phone]     Sample type ARTERIAL DRAW     Allens test (pass/fail) PASS  PASS  GLUCOSE, CAPILLARY     Status: Abnormal   Collection Time    09/24/12 11:16 AM      Result Value Range   Glucose-Capillary 256 (*) 70 - 99 mg/dL   Comment 1 Notify RN    GLUCOSE, CAPILLARY     Status: Abnormal   Collection Time    09/24/12 12:30 PM      Result Value Range   Glucose-Capillary 227 (*) 70 - 99 mg/dL    Radiological Exams on Admission: No results found.  Assessment/Plan Active Problems:    1. Diabetes mellitus/DKA (diabetic ketoacidoses): Patient is a known diabetic, and given body habitus, as well as age, appears to be a type 1 diabetic, who has presented in obvious DKA, with ABG showing PH 7.108, PCO2 18.0, Bicarb 5.4,  ketonuria as well as a calculated anion gap of 30, with random blood glucose of 339. We shall admit to SDU and manage with aggressive iv fluids, ivi insulin administration per Gluco-stabilizer protocol, which has already been commenced in the ED. A single dose of 1 amp Sodium Bicarbonate will be administered, given profound metabolic acidosis. We shall also check UDS. He has no clinically discernible evidence of infection.  2. Oral thrush: This was an incidental finding on physical examination. We shall manage with Nystatin oral suspension,and do HIV test.  3. Medication Non-Compliance: Patient appears unable to afford his medication, resulting in #1 above. Care manager will be consulted, to assist in establishing PMD and obtaining medication assistance.   Further management will depend on clinical course.   Comment: Patient is FULL CODE.    Time Spent on Admission: 45 mins.   Liahna Brickner,CHRISTOPHER 09/24/2012, 12:36 PM

## 2012-09-25 ENCOUNTER — Encounter (HOSPITAL_COMMUNITY): Payer: Self-pay | Admitting: *Deleted

## 2012-09-25 DIAGNOSIS — E876 Hypokalemia: Secondary | ICD-10-CM

## 2012-09-25 LAB — BASIC METABOLIC PANEL
BUN: 6 mg/dL (ref 6–23)
BUN: 6 mg/dL (ref 6–23)
CO2: 19 mEq/L (ref 19–32)
CO2: 27 mEq/L (ref 19–32)
Calcium: 8.9 mg/dL (ref 8.4–10.5)
Calcium: 8.7 mg/dL (ref 8.4–10.5)
Calcium: 8.7 mg/dL (ref 8.4–10.5)
Chloride: 104 mEq/L (ref 96–112)
GFR calc Af Amer: >90 mL/min (ref >90)
GFR calc Af Amer: >90 mL/min (ref >90)
GFR calc non Af Amer: >90 mL/min (ref >90)
GFR calc non Af Amer: >90 mL/min (ref >90)
GFR calc non Af Amer: >90 mL/min (ref >90)
GFR calc non Af Amer: >90 mL/min (ref >90)
Glucose, Bld: 138 mg/dL — ABNORMAL HIGH (ref 70–99)
Glucose, Bld: 391 mg/dL — ABNORMAL HIGH (ref 70–99)
Glucose, Bld: 274 mg/dL — ABNORMAL HIGH (ref 70–99)
Glucose, Bld: 309 mg/dL — ABNORMAL HIGH (ref 70–99)
Potassium: 2.6 mEq/L — CL (ref 3.5–5.1)
Potassium: 3.2 mEq/L — ABNORMAL LOW (ref 3.5–5.1)
Potassium: 3.7 mEq/L (ref 3.5–5.1)
Potassium: 3.5 mEq/L (ref 3.5–5.1)
Potassium: 3.1 mEq/L — ABNORMAL LOW (ref 3.5–5.1)
Sodium: 134 mEq/L — ABNORMAL LOW (ref 135–145)
Sodium: 136 mEq/L (ref 135–145)
Sodium: 132 mEq/L — ABNORMAL LOW (ref 135–145)
Sodium: 132 mEq/L — ABNORMAL LOW (ref 135–145)

## 2012-09-25 LAB — GLUCOSE, CAPILLARY
Glucose-Capillary: 106 mg/dL — ABNORMAL HIGH (ref 70–99)
Glucose-Capillary: 147 mg/dL — ABNORMAL HIGH (ref 70–99)
Glucose-Capillary: 192 mg/dL — ABNORMAL HIGH (ref 70–99)
Glucose-Capillary: 250 mg/dL — ABNORMAL HIGH (ref 70–99)
Glucose-Capillary: 254 mg/dL — ABNORMAL HIGH (ref 70–99)
Glucose-Capillary: 300 mg/dL — ABNORMAL HIGH (ref 70–99)
Glucose-Capillary: 324 mg/dL — ABNORMAL HIGH (ref 70–99)
Glucose-Capillary: 97 mg/dL (ref 70–99)

## 2012-09-25 LAB — CBC
HCT: 34.3 % — ABNORMAL LOW (ref 39.0–52.0)
Hemoglobin: 12.2 g/dL — ABNORMAL LOW (ref 13.0–17.0)
WBC: 7.4 10*3/uL (ref 4.0–10.5)

## 2012-09-25 MED ORDER — INSULIN GLARGINE 100 UNIT/ML ~~LOC~~ SOLN
10.0000 [IU] | Freq: Every day | SUBCUTANEOUS | Status: DC
  Administered 2012-09-25: 10 [IU] via SUBCUTANEOUS
  Filled 2012-09-25 (×2): qty 0.1

## 2012-09-25 MED ORDER — INSULIN ASPART 100 UNIT/ML ~~LOC~~ SOLN
9.0000 [IU] | Freq: Once | SUBCUTANEOUS | Status: AC
  Administered 2012-09-25: 9 [IU] via SUBCUTANEOUS

## 2012-09-25 MED ORDER — POTASSIUM CHLORIDE CRYS ER 20 MEQ PO TBCR
40.0000 meq | EXTENDED_RELEASE_TABLET | ORAL | Status: AC
  Administered 2012-09-25 (×2): 40 meq via ORAL
  Filled 2012-09-25 (×2): qty 2

## 2012-09-25 MED ORDER — INSULIN ASPART 100 UNIT/ML ~~LOC~~ SOLN
0.0000 [IU] | Freq: Three times a day (TID) | SUBCUTANEOUS | Status: DC
  Administered 2012-09-25: 7 [IU] via SUBCUTANEOUS
  Administered 2012-09-25 (×2): 3 [IU] via SUBCUTANEOUS
  Administered 2012-09-26: 2 [IU] via SUBCUTANEOUS

## 2012-09-25 MED ORDER — SODIUM CHLORIDE 0.9 % IV SOLN
INTRAVENOUS | Status: DC
  Administered 2012-09-25 (×2): via INTRAVENOUS

## 2012-09-25 MED ORDER — INSULIN ASPART 100 UNIT/ML ~~LOC~~ SOLN
0.0000 [IU] | Freq: Every day | SUBCUTANEOUS | Status: DC
  Administered 2012-09-25: 4 [IU] via SUBCUTANEOUS

## 2012-09-25 MED ORDER — POTASSIUM CHLORIDE CRYS ER 20 MEQ PO TBCR
40.0000 meq | EXTENDED_RELEASE_TABLET | Freq: Once | ORAL | Status: AC
  Administered 2012-09-25: 40 meq via ORAL
  Filled 2012-09-25: qty 2

## 2012-09-25 MED ORDER — LIVING WELL WITH DIABETES BOOK
Freq: Once | Status: DC
  Filled 2012-09-25 (×2): qty 1

## 2012-09-25 MED ORDER — INSULIN ASPART PROT & ASPART (70-30 MIX) 100 UNIT/ML ~~LOC~~ SUSP
20.0000 [IU] | Freq: Two times a day (BID) | SUBCUTANEOUS | Status: DC
  Administered 2012-09-25 – 2012-09-26 (×2): 20 [IU] via SUBCUTANEOUS
  Filled 2012-09-25: qty 10

## 2012-09-25 NOTE — Progress Notes (Signed)
Inpatient Diabetes Program Recommendations  AACE/ADA: New Consensus Statement on Inpatient Glycemic Control (2013)  Target Ranges:  Prepandial:   less than 140 mg/dL      Peak postprandial:   less than 180 mg/dL (1-2 hours)      Critically ill patients:  140 - 180 mg/dL   Reason for Visit: Patient admitted with DKA.  Diagnosed with diabetes last year in [place].  He has lost considerable amount of weight and admits to not taking insulin for the past 2 weeks due to running out.  He has tried to get Meidcaid but has had difficulty getting through process. He currently works at [employer] that does not [employer].  Discussed DKA and cause.  Also discussed action of insulins including basal versus bolus and Novolin 70/30 (must take with a meal).  He was taking Levemir 3 times a day previously.  Explained why taking only basal insulin is not effective.  He needs lots of education and seems motivated.  States that most of what he has learned has been through reading.  He has meter (generic/[facility]).  Will order outpatient diabetes education (hopefully patient will obtain orange card to assist with payment).   [facility]ed [facility] to obtain appointment for patient on Thursday June 5 at 4pm with Dr. Laural Benes.  Gave patient appointment date, time and address.  Discussed with Dr. Cena Benton.  Will switch patient to 70/30 insulin regimen due to cost (Novolin 70/30 is $24.88 per vial at [facility]).      Note: Will see patient on 09/26/12.

## 2012-09-25 NOTE — Progress Notes (Signed)
[facility] PROGRESS NOTE  Shawn Maldonado ZOX:[id number] DOB: 05-06-1985 DOA: 09/24/2012 PCP: No primary provider on file.  Assessment/Plan: 1. DKA - At this point has resolved. Patient is on SQ insulin - Will place order for case manager to assist with medications - space out BMP's to q 8 hours  2. Diabetes Mellitus - Diabetic diet - Titrate insulin regimen pending blood sugars - continue routine cbg checks  3. Hypokalemia - Replaced orally and has resolved  4. Oral thrush - Patient is on Nystatin. Continue this regimen and reassess   Code Status: Full Family Communication: Discussed with patient at bedside. No family at bedside Disposition Plan: Pending continued improvement in condition.  Consult diabetic coordinator. Will consider using more affordable medication like Novolog 70/30   Consultants:  Diabetic coordinator  Procedures:  none  Antibiotics:  None  HPI/Subjective: Pt has no new complaints.  States that he feels considerably better currently.  No acute issues reported overnight.  Objective: Filed Vitals:   09/25/12 0915 09/25/12 1100 09/25/12 1325 09/25/12 1425  BP: 126/70  122/70 118/86  Pulse: 86  82 104  Temp:    98.6 F (37 C)  TempSrc:    Oral  Resp: 13 16 20 19   Height:    5\' 10"  (1.778 m)  Weight:    56.2 kg (123 lb 14.4 oz)  SpO2: 100%  100% 100%    Intake/Output Summary (Last 24 hours) at 09/25/12 1429 Last data filed at 09/25/12 1300  Gross per 24 hour  Intake 2966.21 ml  Output   2701 ml  Net 265.21 ml   Filed Weights   09/24/12 1330 09/25/12 1425  Weight: 55.2 kg (121 lb 11.1 oz) 56.2 kg (123 lb 14.4 oz)    Exam:   General:  Pt in NAD, Alert and Awake  Cardiovascular: RRR, No MRG  Respiratory: CTA BL, no wheezes  Abdomen: soft, NT, ND  Musculoskeletal: no cyanosis or clubbing   Data Reviewed: Basic Metabolic Panel:  Recent Labs Lab 09/24/12 1636 09/24/12 1855 09/24/12 1856 09/25/12 0050 09/25/12 0535  09/25/12 0900 09/25/12 1245  NA 135  --  138 134* 136 131* 132*  K 2.7*  --  2.7* 2.6* 3.2* 3.7 3.5  CL 104  --  107 105 104 99 101  CO2 15*  --  19 19 21 19 22   GLUCOSE 221*  --  131* 123* 138* 391* 274*  BUN 8  --  7 7 6 6 7   CREATININE 0.62  --  0.61 0.59 0.68 0.57 0.53  CALCIUM 8.4  --  8.8 8.9 9.0 8.7 8.7  MG  --  1.6  --   --  1.7  --   --    Liver Function Tests:  Recent Labs Lab 09/24/12 0945  AST 13  ALT 12  ALKPHOS 112  BILITOT 0.3  PROT 8.3  ALBUMIN 4.1   No results found for this basename: LIPASE, AMYLASE,  in the last 168 hours No results found for this basename: AMMONIA,  in the last 168 hours CBC:  Recent Labs Lab 09/24/12 0945 09/25/12 0535  WBC 7.7 7.4  NEUTROABS 5.6  --   HGB 15.5 12.2*  HCT 42.7 34.3*  MCV 85.2 81.9  PLT 261 199   Cardiac Enzymes:  Recent Labs Lab 09/24/12 1352 09/24/12 1856 09/25/12 0050  TROPONINI <0.30 <0.30 <0.30   BNP (last 3 results) No results found for this basename: PROBNP,  in the last 8760 hours CBG:  Recent Labs Lab 09/25/12 0122 09/25/12 0218 09/25/12 0325 09/25/12 0719 09/25/12 1156  GLUCAP 106* 107* 97 206* 324*    Recent Results (from the past 240 hour(s))  MRSA PCR SCREENING     Status: None   Collection Time    09/24/12  1:20 PM      Result Value Range Status   MRSA by PCR NEGATIVE  NEGATIVE Final   Comment:            The GeneXpert MRSA Assay (FDA     approved for NASAL specimens     only), is one component of a     comprehensive MRSA colonization     surveillance program. It is not     intended to diagnose MRSA     infection nor to guide or     monitor treatment for     MRSA infections.     Studies: No results found.  Scheduled Meds: . insulin aspart  0-5 Units Subcutaneous QHS  . insulin aspart  0-9 Units Subcutaneous TID WC  . insulin glargine  10 Units Subcutaneous QHS  . nystatin  5 mL Oral QID  . sodium chloride  3 mL Intravenous Q12H   Continuous Infusions: .  sodium chloride 100 mL/hr at 09/25/12 1116  . insulin (NOVOLIN-R) infusion Stopped (09/25/12 0400)    Active Problems:   Diabetes mellitus   DKA (diabetic ketoacidoses)   Oral thrush    Time spent: > 35 minutes    [name]  [facility] Pager [phone]. If 7PM-7AM, please contact night-coverage at [email or web address], password [facility] 09/25/2012, 2:29 PM  LOS: 1 day

## 2012-09-26 DIAGNOSIS — E1110 Type 2 diabetes mellitus with ketoacidosis without coma: Secondary | ICD-10-CM

## 2012-09-26 DIAGNOSIS — B370 Candidal stomatitis: Secondary | ICD-10-CM

## 2012-09-26 DIAGNOSIS — E876 Hypokalemia: Secondary | ICD-10-CM

## 2012-09-26 DIAGNOSIS — E119 Type 2 diabetes mellitus without complications: Secondary | ICD-10-CM

## 2012-09-26 LAB — BASIC METABOLIC PANEL
BUN: 8 mg/dL (ref 6–23)
Chloride: 104 mEq/L (ref 96–112)
Creatinine, Ser: 0.68 mg/dL (ref 0.50–1.35)
GFR calc Af Amer: >90 mL/min (ref >90)
GFR calc non Af Amer: >90 mL/min (ref >90)

## 2012-09-26 LAB — GLUCOSE, CAPILLARY

## 2012-09-26 MED ORDER — INSULIN ASPART PROT & ASPART (70-30 MIX) 100 UNIT/ML ~~LOC~~ SUSP: 20.0000 [IU] | Freq: Two times a day (BID) | SUBCUTANEOUS | Status: DC

## 2012-09-26 MED ORDER — POTASSIUM CHLORIDE CRYS ER 20 MEQ PO TBCR
40.0000 meq | EXTENDED_RELEASE_TABLET | Freq: Once | ORAL | Status: AC
  Administered 2012-09-26: 40 meq via ORAL
  Filled 2012-09-26: qty 2

## 2012-09-26 NOTE — Discharge Summary (Signed)
Physician Discharge Summary  Ansen Sayegh AOZ:[id number] DOB: 26-Nov-1985 DOA: 09/24/2012  PCP: No primary provider on file.  Admit date: 09/24/2012 Discharge date: 09/26/2012  Time spent: > 35 minutes  Recommendations for Outpatient Follow-up:  1) Please check patient's blood sugars (will ask patient to log his blood sugars) and adjust anti hypoglycemic agent accordingly 2) continue to encourage Diabetic diet and reinforce good practices for compliance. 3) Please be sure to follow up on patient's potassium levels.  Discharge Diagnoses:  Active Problems:   Diabetes mellitus   DKA (diabetic ketoacidoses)   Oral thrush   Hypokalemia   Discharge Condition: Stable  Diet recommendation: Diabetic diet  Filed Weights   09/24/12 1330 09/25/12 1425  Weight: 55.2 kg (121 lb 11.1 oz) 56.2 kg (123 lb 14.4 oz)    History of present illness:  27 y/o with recent diagnosis of diabetes ~ 1 year ago who presented to the hospital in DKA. Patient reported having difficulty affording his anti hypoglycemic agents.  Hospital Course:   1. DKA - At this point has resolved. Patient is on SQ insulin   2. Diabetes Mellitus  - Diabetic diet  - I will recommend patient check his blood sugars at least 2 times per day fasting and qhs. - After discussion with diabetic coordinator we have coordinated a plan for patient to be able to obtain further care in the future.  He will be discharged on Novolog 70/30 which is more affordable and diabetic coordinator has provided information for the [facility] to obtain appointment for patient on Thursday June 5 at 4pm with Dr. Laural Benes. Patient was given appointment date, time and address  3. Hypokalemia  - Replaced orally prior to discharge  4. Oral thrush  - Patient is on Nystatin. Continue this regimen and reassess  Procedures:  Initially was on insulin drip  Consultations:  Diabetic coordinator  Discharge Exam: Filed Vitals:    09/25/12 1325 09/25/12 1425 09/25/12 1937 09/26/12 0529  BP: 122/70 118/86 111/61 107/52  Pulse: 82 104 92 70  Temp:  98.6 F (37 C) 98.5 F (36.9 C) 97.4 F (36.3 C)  TempSrc:  Oral Oral Oral  Resp: 20 19 18 18   Height:  5\' 10"  (1.778 m)    Weight:  56.2 kg (123 lb 14.4 oz)    SpO2: 100% 100% 100% 100%    General: Pt in NAD, Alert and Awake Cardiovascular: RRR, No MRG Respiratory: CTA BL, no wheezes  Discharge Instructions  Discharge Orders   Future Appointments Provider Department Dept Phone   10/03/2012 4:00 PM [facility] Covering Provider [facility] Joan Flores [phone]   Future Orders Complete By Expires     Ambulatory referral to Nutrition and Diabetic Education  As directed     Scheduling Instructions:      Type 1 diabetes-Needs 1:1 with CDE.  In process of applying for orange card    Call MD for:  persistant dizziness or light-headedness  As directed     Call MD for:  temperature >100.4  As directed     Diet - low sodium heart healthy  As directed     Discharge instructions  As directed     Comments:      You are to follow up with a primary care physician in 1-2 weeks or sooner should any new concerns arise.  Also you are to to check your blood sugars at the least fasting and qhs    Increase activity slowly  As directed         Medication List    STOP taking these medications       insulin detemir 100 UNIT/ML injection  Commonly known as:  LEVEMIR     metFORMIN 500 MG tablet  Commonly known as:  GLUCOPHAGE      TAKE these medications       insulin aspart protamine- aspart (70-30) 100 UNIT/ML injection  Commonly known as:  NOVOLOG 70/30  Inject 0.2 mLs (20 Units total) into the skin 2 (two) times daily with a meal.       No Known Allergies    The results of significant diagnostics from this hospitalization (including imaging, microbiology, ancillary and laboratory) are listed below for reference.    Significant Diagnostic  Studies: No results found.  Microbiology: Recent Results (from the past 240 hour(s))  MRSA PCR SCREENING     Status: None   Collection Time    09/24/12  1:20 PM      Result Value Range Status   MRSA by PCR NEGATIVE  NEGATIVE Final   Comment:            The GeneXpert MRSA Assay (FDA     approved for NASAL specimens     only), is one component of a     comprehensive MRSA colonization     surveillance program. It is not     intended to diagnose MRSA     infection nor to guide or     monitor treatment for     MRSA infections.     Labs: Basic Metabolic Panel:  Recent Labs Lab 09/24/12 1636 09/24/12 1855  09/25/12 0535 09/25/12 0900 09/25/12 1245 09/25/12 2100 09/26/12 0420  NA 135  --   < > 136 131* 132* 132* 139  K 2.7*  --   < > 3.2* 3.7 3.5 3.1* 3.0*  CL 104  --   < > 104 99 101 98 104  CO2 15*  --   < > 21 19 22 27 30   GLUCOSE 221*  --   < > 138* 391* 274* 309* 92  BUN 8  --   < > 6 6 7 9 8   CREATININE 0.62  --   < > 0.68 0.57 0.53 0.57 0.68  CALCIUM 8.4  --   < > 9.0 8.7 8.7 8.9 9.0  MG  --  1.6  --  1.7  --   --   --   --   < > = values in this interval not displayed. Liver Function Tests:  Recent Labs Lab 09/24/12 0945  AST 13  ALT 12  ALKPHOS 112  BILITOT 0.3  PROT 8.3  ALBUMIN 4.1   No results found for this basename: LIPASE, AMYLASE,  in the last 168 hours No results found for this basename: AMMONIA,  in the last 168 hours CBC:  Recent Labs Lab 09/24/12 0945 09/25/12 0535  WBC 7.7 7.4  NEUTROABS 5.6  --   HGB 15.5 12.2*  HCT 42.7 34.3*  MCV 85.2 81.9  PLT 261 199   Cardiac Enzymes:  Recent Labs Lab 09/24/12 1352 09/24/12 1856 09/25/12 0050  TROPONINI <0.30 <0.30 <0.30   BNP: BNP (last 3 results) No results found for this basename: PROBNP,  in the last 8760 hours CBG:  Recent Labs Lab 09/25/12 1402 09/25/12 1422 09/25/12 1636 09/25/12 2106 09/26/12 0734  GLUCAP 254* 250* 192* 300* 163*       Signed:  Dianara Smullen,  Mayank Teuscher  [facility] 09/26/2012, 8:26 AM

## 2012-09-26 NOTE — Care Management Note (Signed)
    Page 1 of 1   09/26/2012     3:29:55 PM   CARE MANAGEMENT NOTE 09/26/2012  Patient:  Shawn Maldonado, Shawn Maldonado   Account Number:  [id number]  Date Initiated:  09/26/2012  Documentation initiated by:  Lanier Clam  Subjective/Objective Assessment:   ADMITTED W/DKA.     Action/Plan:   FROM HOME   Anticipated DC Date:  09/26/2012   Anticipated DC Plan:  HOME/SELF CARE      DC Planning Services  CM consult      Choice offered to / List presented to:             Status of service:  Completed, signed off Medicare Important Message given?   (If response is "NO", the following Medicare IM given date fields will be blank) Date Medicare IM given:   Date Additional Medicare IM given:    Discharge Disposition:  HOME/SELF CARE  Per UR Regulation:  Reviewed for med. necessity/level of care/duration of stay  If discussed at Long Length of Stay Meetings, dates discussed:    Comments:  09/26/12 Jocelin Schuelke RN,BSN NCM [phone] NOTED ED CM NOTES-GIVEN PCP LOCAL RESOURCES.APPT MADE FOR THE ADULT CLINIC,PATIENT ABLE TO AFFORD THE SCRIPTS WRITTEN @ D/C.NO FURTHER D/C NEEDS OR ORDERS.

## 2012-10-03 ENCOUNTER — Inpatient Hospital Stay: Payer: Self-pay

## 2012-10-30 ENCOUNTER — Ambulatory Visit: Payer: Self-pay | Admitting: *Deleted

## 2015-04-30 ENCOUNTER — Encounter (HOSPITAL_COMMUNITY): Payer: Self-pay

## 2015-04-30 ENCOUNTER — Inpatient Hospital Stay (HOSPITAL_COMMUNITY)
Admission: EM | Admit: 2015-04-30 | Discharge: 2015-05-02 | DRG: 639 | Disposition: A | Discharge status: Home / Self Care | Payer: Medicaid Other | Attending: Internal Medicine | Admitting: Internal Medicine

## 2015-04-30 DIAGNOSIS — Z87891 Personal history of nicotine dependence: Secondary | ICD-10-CM

## 2015-04-30 DIAGNOSIS — E108 Type 1 diabetes mellitus with unspecified complications: Secondary | ICD-10-CM | POA: Diagnosis not present

## 2015-04-30 DIAGNOSIS — Z9119 Patient's noncompliance with other medical treatment and regimen: Secondary | ICD-10-CM

## 2015-04-30 DIAGNOSIS — E876 Hypokalemia: Secondary | ICD-10-CM | POA: Diagnosis present

## 2015-04-30 DIAGNOSIS — Z8249 Family history of ischemic heart disease and other diseases of the circulatory system: Secondary | ICD-10-CM | POA: Diagnosis not present

## 2015-04-30 DIAGNOSIS — J069 Acute upper respiratory infection, unspecified: Secondary | ICD-10-CM | POA: Diagnosis present

## 2015-04-30 DIAGNOSIS — R000 Tachycardia, unspecified: Secondary | ICD-10-CM | POA: Diagnosis not present

## 2015-04-30 DIAGNOSIS — E1110 Type 2 diabetes mellitus with ketoacidosis without coma: Secondary | ICD-10-CM | POA: Diagnosis present

## 2015-04-30 DIAGNOSIS — Z794 Long term (current) use of insulin: Secondary | ICD-10-CM | POA: Diagnosis not present

## 2015-04-30 DIAGNOSIS — E0810 Diabetes mellitus due to underlying condition with ketoacidosis without coma: Secondary | ICD-10-CM | POA: Diagnosis not present

## 2015-04-30 DIAGNOSIS — E1010 Type 1 diabetes mellitus with ketoacidosis without coma: Secondary | ICD-10-CM | POA: Diagnosis not present

## 2015-04-30 DIAGNOSIS — E1310 Other specified diabetes mellitus with ketoacidosis without coma: Secondary | ICD-10-CM | POA: Diagnosis present

## 2015-04-30 DIAGNOSIS — E119 Type 2 diabetes mellitus without complications: Secondary | ICD-10-CM

## 2015-04-30 LAB — BASIC METABOLIC PANEL
ANION GAP: 13 (ref 5–15)
ANION GAP: 11 (ref 5–15)
BUN: 13 mg/dL (ref 6–20)
BUN: 8 mg/dL (ref 6–20)
BUN: 7 mg/dL (ref 6–20)
CHLORIDE: 111 mmol/L (ref 101–111)
CHLORIDE: 109 mmol/L (ref 101–111)
CO2: <7 mmol/L — ABNORMAL LOW (ref 22–32)
CO2: 13 mmol/L — AB (ref 22–32)
CO2: 15 mmol/L — ABNORMAL LOW (ref 22–32)
Calcium: 9.3 mg/dL (ref 8.9–10.3)
Calcium: 9.1 mg/dL (ref 8.9–10.3)
Calcium: 9 mg/dL (ref 8.9–10.3)
Chloride: 102 mmol/L (ref 101–111)
Creatinine, Ser: 1.13 mg/dL (ref 0.61–1.24)
Creatinine, Ser: 0.96 mg/dL (ref 0.61–1.24)
Creatinine, Ser: 0.76 mg/dL (ref 0.61–1.24)
GFR calc Af Amer: >60 mL/min (ref >60)
GFR calc non Af Amer: >60 mL/min (ref >60)
GFR calc non Af Amer: >60 mL/min (ref >60)
GFR calc non Af Amer: >60 mL/min (ref >60)
Glucose, Bld: 476 mg/dL — ABNORMAL HIGH (ref 65–99)
Glucose, Bld: 145 mg/dL — ABNORMAL HIGH (ref 65–99)
Glucose, Bld: 203 mg/dL — ABNORMAL HIGH (ref 65–99)
POTASSIUM: 3.8 mmol/L (ref 3.5–5.1)
Potassium: 5.6 mmol/L — ABNORMAL HIGH (ref 3.5–5.1)
Potassium: 4.5 mmol/L (ref 3.5–5.1)
SODIUM: 137 mmol/L (ref 135–145)
SODIUM: 135 mmol/L (ref 135–145)
Sodium: 135 mmol/L (ref 135–145)

## 2015-04-30 LAB — GLUCOSE, CAPILLARY
GLUCOSE-CAPILLARY: 224 mg/dL — AB (ref 65–99)
GLUCOSE-CAPILLARY: 153 mg/dL — AB (ref 65–99)
GLUCOSE-CAPILLARY: 192 mg/dL — AB (ref 65–99)
Glucose-Capillary: 126 mg/dL — ABNORMAL HIGH (ref 65–99)
Glucose-Capillary: 153 mg/dL — ABNORMAL HIGH (ref 65–99)
Glucose-Capillary: 156 mg/dL — ABNORMAL HIGH (ref 65–99)
Glucose-Capillary: 173 mg/dL — ABNORMAL HIGH (ref 65–99)
Glucose-Capillary: 173 mg/dL — ABNORMAL HIGH (ref 65–99)

## 2015-04-30 LAB — CBG MONITORING, ED
Glucose-Capillary: 409 mg/dL — ABNORMAL HIGH (ref 65–99)
Glucose-Capillary: 385 mg/dL — ABNORMAL HIGH (ref 65–99)
Glucose-Capillary: 284 mg/dL — ABNORMAL HIGH (ref 65–99)

## 2015-04-30 LAB — URINALYSIS, ROUTINE W REFLEX MICROSCOPIC
Bilirubin Urine: NEGATIVE
Glucose, UA: >1000 mg/dL — AB
Hgb urine dipstick: NEGATIVE
Ketones, ur: >80 mg/dL — AB
Leukocytes, UA: NEGATIVE
Nitrite: NEGATIVE
Protein, ur: 30 mg/dL — AB
Specific Gravity, Urine: 1.026 (ref 1.005–1.030)
pH: 5 (ref 5.0–8.0)

## 2015-04-30 LAB — CBC
HCT: 43.4 % (ref 39.0–52.0)
Hemoglobin: 15 g/dL (ref 13.0–17.0)
MCH: 31.6 pg (ref 26.0–34.0)
MCHC: 34.6 g/dL (ref 30.0–36.0)
MCV: 91.6 fL (ref 78.0–100.0)
Platelets: 289 10*3/uL (ref 150–400)
RBC: 4.74 MIL/uL (ref 4.22–5.81)
RDW: 12.6 % (ref 11.5–15.5)
WBC: 13.1 10*3/uL — ABNORMAL HIGH (ref 4.0–10.5)

## 2015-04-30 LAB — URINE MICROSCOPIC-ADD ON
Bacteria, UA: NONE SEEN
RBC / HPF: NONE SEEN RBC/hpf (ref 0–5)

## 2015-04-30 LAB — MRSA PCR SCREENING: MRSA by PCR: INVALID — AB

## 2015-04-30 LAB — MAGNESIUM: MAGNESIUM: 2.1 mg/dL (ref 1.7–2.4)

## 2015-04-30 LAB — PHOSPHORUS: PHOSPHORUS: 5.7 mg/dL — AB (ref 2.5–4.6)

## 2015-04-30 MED ORDER — ONDANSETRON HCL 4 MG/2ML IJ SOLN
4.0000 mg | Freq: Once | INTRAMUSCULAR | Status: AC
  Administered 2015-04-30: 4 mg via INTRAVENOUS
  Filled 2015-04-30: qty 2

## 2015-04-30 MED ORDER — SODIUM CHLORIDE 0.9 % IV SOLN: INTRAVENOUS | Status: DC

## 2015-04-30 MED ORDER — OXYMETAZOLINE HCL 0.05 % NA SOLN
1.0000 | Freq: Two times a day (BID) | NASAL | Status: DC
Start: 2015-04-30 — End: 2015-04-30

## 2015-04-30 MED ORDER — SODIUM CHLORIDE 0.9 % IV SOLN
INTRAVENOUS | Status: DC
  Administered 2015-04-30: 3.3 [IU]/h via INTRAVENOUS
  Filled 2015-04-30: qty 2.5

## 2015-04-30 MED ORDER — INSULIN ASPART 100 UNIT/ML ~~LOC~~ SOLN
0.0000 [IU] | Freq: Every day | SUBCUTANEOUS | Status: DC
  Administered 2015-05-01: 2 [IU] via SUBCUTANEOUS

## 2015-04-30 MED ORDER — INSULIN ASPART 100 UNIT/ML ~~LOC~~ SOLN
0.0000 [IU] | Freq: Three times a day (TID) | SUBCUTANEOUS | Status: DC
  Administered 2015-05-01 (×2): 3 [IU] via SUBCUTANEOUS
  Administered 2015-05-01: 1 [IU] via SUBCUTANEOUS
  Administered 2015-05-02: 3 [IU] via SUBCUTANEOUS

## 2015-04-30 MED ORDER — ONDANSETRON HCL 4 MG/2ML IJ SOLN: 4.0000 mg | Freq: Four times a day (QID) | INTRAMUSCULAR | Status: DC | PRN

## 2015-04-30 MED ORDER — ALUM & MAG HYDROXIDE-SIMETH 200-200-20 MG/5ML PO SUSP: 30.0000 mL | ORAL | Status: DC | PRN

## 2015-04-30 MED ORDER — SODIUM CHLORIDE 0.9 % IV BOLUS (SEPSIS)
1000.0000 mL | Freq: Once | INTRAVENOUS | Status: AC
  Administered 2015-04-30: 1000 mL via INTRAVENOUS

## 2015-04-30 MED ORDER — ENOXAPARIN SODIUM 40 MG/0.4ML ~~LOC~~ SOLN
40.0000 mg | SUBCUTANEOUS | Status: DC
  Administered 2015-04-30 – 2015-05-01 (×2): 40 mg via SUBCUTANEOUS
  Filled 2015-04-30 (×3): qty 0.4

## 2015-04-30 MED ORDER — INSULIN REGULAR HUMAN 100 UNIT/ML IJ SOLN
INTRAMUSCULAR | Status: DC
  Filled 2015-04-30: qty 2.5

## 2015-04-30 MED ORDER — INSULIN DETEMIR 100 UNIT/ML ~~LOC~~ SOLN
25.0000 [IU] | Freq: Every day | SUBCUTANEOUS | Status: DC
  Administered 2015-04-30 – 2015-05-01 (×2): 25 [IU] via SUBCUTANEOUS
  Filled 2015-04-30 (×4): qty 0.25

## 2015-04-30 MED ORDER — SODIUM CHLORIDE 0.9 % IV BOLUS (SEPSIS)
2000.0000 mL | Freq: Once | INTRAVENOUS | Status: AC
  Administered 2015-04-30: 2000 mL via INTRAVENOUS

## 2015-04-30 MED ORDER — ACETAMINOPHEN 325 MG PO TABS
650.0000 mg | ORAL_TABLET | ORAL | Status: DC | PRN
  Administered 2015-05-01: 650 mg via ORAL
  Filled 2015-04-30: qty 2

## 2015-04-30 MED ORDER — SODIUM CHLORIDE 0.9 % IV SOLN
INTRAVENOUS | Status: DC
  Administered 2015-04-30: 14:00:00 via INTRAVENOUS

## 2015-04-30 MED ORDER — DEXTROSE-NACL 5-0.45 % IV SOLN
INTRAVENOUS | Status: DC
  Administered 2015-04-30: 100 mL via INTRAVENOUS
  Administered 2015-05-01: 02:00:00 via INTRAVENOUS

## 2015-04-30 MED ORDER — INSULIN ASPART 100 UNIT/ML ~~LOC~~ SOLN
4.0000 [IU] | Freq: Three times a day (TID) | SUBCUTANEOUS | Status: DC
  Administered 2015-05-01 – 2015-05-02 (×6): 4 [IU] via SUBCUTANEOUS

## 2015-04-30 MED ORDER — OXYMETAZOLINE HCL 0.05 % NA SOLN
1.0000 | Freq: Two times a day (BID) | NASAL | Status: DC
  Administered 2015-04-30 – 2015-05-02 (×4): 1 via NASAL
  Filled 2015-04-30 (×2): qty 15

## 2015-04-30 NOTE — ED Notes (Signed)
Bed: [id number] Expected date:  Expected time:  Means of arrival:  Comments: EMS- 29yo M, hyperglycemia/Hx of DM1

## 2015-04-30 NOTE — Progress Notes (Signed)
Medicaid [place] access indicates pcp as  [facility]  [address] [place], [place] [id number] [phone] EPIC updated

## 2015-04-30 NOTE — ED Notes (Signed)
ICU/Stepdown timer started.

## 2015-04-30 NOTE — H&P (Addendum)
History and Physical:    Shawn Maldonado   WGN:[id number] DOB: 01/19/1986 DOA: 04/30/2015  Referring MD/provider: Dr. Raeford Razor PCP: [facility] No current PCP  Chief Complaint: Uncontrolled blood glucoses with nausea and vomiting.  History of Present Illness:   Shawn Maldonado is an 29 y.o. male with a PMH of type 1 diabetes diagnosed in his 33s, managed with a combination of Levemir and NovoLog, who states he ran out of his Levemir approximately 2 months ago. He has been managing his diabetes with NovoLog using increased doses. Patient reports that he developed sinus congestion 24 hours ago. He took NyQuil with no relief and then awoke at 4 AM with nausea and vomiting. He became very thirsty, felt worse and "felt out of place "with a racing heart. He says he has not taken any insulin in the past 24 hours because he left it at work. Upon initial evaluation in the ED, the patient was found to be in DKA with a bicarbonate of less than 7 and an anion gap of greater than 20.  ROS:   Review of Systems  Constitutional: Positive for fever, chills and malaise/fatigue.  HENT: Positive for congestion and sore throat. Negative for ear pain.   Eyes: Negative for blurred vision.  Respiratory: Positive for shortness of breath. Negative for cough and sputum production.   Cardiovascular: Positive for palpitations. Negative for chest pain.  Gastrointestinal: Positive for nausea and vomiting. Negative for heartburn, abdominal pain and diarrhea.  Genitourinary: Negative.   Musculoskeletal: Negative.   Skin: Negative.   Neurological: Positive for weakness and headaches.  Endo/Heme/Allergies: Negative.   Psychiatric/Behavioral: Negative.      Past Medical History:   Past Medical History  Diagnosis Date  . Diabetes mellitus without complication [facility])     Past Surgical History:   Past Surgical History  Procedure Laterality Date  . Mouth surgery      Social History:   Social  History   Social History  . Marital Status: Single    Spouse Name: N/A  . Number of Children: 2  . Years of Education: N/A   Occupational History  . Works in a Science writer.    Social History Main Topics  . Smoking status: Former Smoker    Types: Cigarettes  . Smokeless tobacco: Never Used  . Alcohol Use: Yes     Comment: social  . Drug Use: Yes    Special: Marijuana  . Sexual Activity: No   Other Topics Concern  . Not on file   Social History Narrative   Lives with girlfriend.      Family history:   Family History  Problem Relation Age of Onset  . Stroke Father   . CAD Other     Allergies   Review of patient's allergies indicates no known allergies.  Current Medications:   Prior to Admission medications   Medication Sig Start Date End Date Taking? Authorizing Provider  insulin aspart protamine- aspart (NOVOLOG 70/30) (70-30) 100 UNIT/ML injection Inject 0.2 mLs (20 Units total) into the skin 2 (two) times daily with a meal. 09/26/12  Yes [name], MD  Pseudoeph-Doxylamine-DM-APAP (NYQUIL PO) Take 2 tablets by mouth at bedtime as needed (sinus pressure).   Yes Historical Provider, MD  Pseudoephedrine-APAP-DM (DAYQUIL PO) Take 2 tablets by mouth daily as needed (sinus pressure).   Yes Historical Provider, MD  Pseudoephedrine-DM-GG-APAP (THERAFLU MAX-D COLD & FLU) [id number] MG PACK Take 1 each by mouth daily as needed (sinus pressure).  Yes Historical Provider, MD    Physical Exam:   Filed Vitals:   04/30/15 1236 04/30/15 1300 04/30/15 1330 04/30/15 1400  BP: 132/67 125/69 125/69 140/78  Pulse: 125 123 123 119  Temp: 98.2 F (36.8 C)     TempSrc: Oral     Resp: 17 19 19 21   SpO2: 100% 100% 100% 100%     Physical Exam: Blood pressure 140/78, pulse 119, temperature 98.2 F (36.8 C), temperature source Oral, resp. rate 21, SpO2 100 %. Gen: No acute distress. Head: Normocephalic, atraumatic. Eyes: PERRL, EOMI, sclerae nonicteric. Mouth:  Oropharynx clear with dry mucous membranes. Neck: Supple, no thyromegaly, no lymphadenopathy, no jugular venous distention. Chest: Lungs clear to auscultation bilaterally. CV: Heart sounds are tachycardic, regular. No murmurs, rubs, or gallops. Abdomen: Soft, nontender, nondistended with normal active bowel sounds. Extremities: Extremities are without clubbing, edema, or cyanosis. Skin: Warm and dry. Neuro: Alert and oriented times 3, nonfocal. Psych: Mood and affect normal.   Data Review:    Labs: Basic Metabolic Panel:  Recent Labs Lab 04/30/15 1108  NA 135  K 5.6*  CL 102  CO2 <7*  GLUCOSE 476*  BUN 13  CREATININE 1.13  CALCIUM 9.3   CBC:  Recent Labs Lab 04/30/15 1108  WBC 13.1*  HGB 15.0  HCT 43.4  MCV 91.6  PLT 289   CBG:  Recent Labs Lab 04/30/15 1009 04/30/15 1249 04/30/15 1354  GLUCAP 409* 385* 284*    Radiographic Studies: No results found.  EKG: None performed.   Assessment/Plan:   Principal Problem:   DKA (diabetic ketoacidoses) (HCC) in a type I diabetic - Status post IV fluid bolus in the ER consisting of normal saline x2 liters, continue to vigorously hydrate with normal saline. - Start on insulin drip per glucose stabilizer protocol with every hour CBG checks and every 4 hour BMET checks until stable. - Supplement potassium if needed. Currently no supplementation required. - Add dextrose to IV fluids when serum glucose less than 250. - Transition to basal/bolus insulin when the ketoacidosis has resolved. Continue IV insulin infusion for one to two hours after initiating the SQ insulin, to avoid recurrent hyperglycemia. - DM coordinator consultation requested.  Active problems:    Upper respiratory infection - Treat symptomatically with Afrin for now.    DVT prophylaxis - Lovenox ordered.  Code Status / Family Communication / Disposition Plan:   Code Status: Full. Family Communication: Bonnielee HaffValerie Nunez (girlfriend) [phone]  is his emergency contact. Disposition Plan: Home when stable.  Attestation regarding necessity of inpatient status:   The appropriate admission status for this patient is INPATIENT. Inpatient status is judged to be reasonable and necessary in order to provide the required intensity of service to ensure the patient's safety. The patient's presenting symptoms, physical exam findings, and initial radiographic and laboratory data in the context of their chronic comorbidities is felt to place them at high risk for further clinical deterioration. Furthermore, it is not anticipated that the patient will be medically stable for discharge from the hospital within 2 midnights of admission. The following factors support the admission status of inpatient.   -The patient's presenting symptoms include nausea, vomiting, malaise. - The worrisome physical exam findings include tachycardia. - The initial radiographic and laboratory data are worrisome because of metabolic acidosis, hyperglycemia. - The chronic co-morbidities include type I DKA. - Patient requires inpatient status due to high intensity of service, high risk for further deterioration and high frequency of surveillance required. - I  certify that at the point of admission it is my clinical judgment that the patient will require inpatient hospital care spanning beyond 2 midnights from the point of admission.   Time spent: One hour.  Shaquille Murdy [facility] Pager [phone] Cell: [phone]   If 7PM-7AM, please contact night-coverage [email or web address] Password [email or web address] 04/30/2015, 2:46 PM

## 2015-04-30 NOTE — Progress Notes (Signed)
Inpatient Diabetes Program Recommendations  AACE/ADA: New Consensus Statement on Inpatient Glycemic Control (2015)  Target Ranges:  Prepandial:   less than 140 mg/dL      Peak postprandial:   less than 180 mg/dL (1-2 hours)      Critically ill patients:  140 - 180 mg/dL   Results for Shawn Maldonado, Shawn Maldonado (MRN [id number]) as of 04/30/2015 15:52  Ref. Range 04/30/2015 11:08  Sodium Latest Ref Range: 135-145 mmol/L 135  Potassium Latest Ref Range: 3.5-5.1 mmol/L 5.6 (H)  Chloride Latest Ref Range: 101-111 mmol/L 102  CO2 Latest Ref Range: 22-32 mmol/L <7 (L)  BUN Latest Ref Range: 6-20 mg/dL 13  Creatinine Latest Ref Range: 0.61-1.24 mg/dL 1.13  Calcium Latest Ref Range: 8.9-10.3 mg/dL 9.3  EGFR (Non-African Amer.) Latest Ref Range: >60 mL/min >60  EGFR (African American) Latest Ref Range: >60 mL/min >60  Glucose Latest Ref Range: 65-99 mg/dL 476 (H)  Anion gap Latest Ref Range: 5-15  NOT CALCULATED    "29 y.o. male with a PMH of type 1 diabetes diagnosed in his 76s, managed with a combination of Levemir and NovoLog, who states he ran out of his Levemir approximately 2 months ago. He has been managing his diabetes with NovoLog using increased doses. Patient reports that he developed sinus congestion 24 hours ago. He took NyQuil with no relief and then awoke at 4 AM with nausea and vomiting. He became very thirsty, felt worse and "felt out of place "with a racing heart. He says he has not taken any insulin in the past 24 hours because he left it at work. Upon initial evaluation in the ED, the patient was found to be in DKA with a bicarbonate of less than 7 and an anion gap of greater than 20."  Admit with: DKA  History: Type 1 DM  Home DM Meds: Levemir (ran out 2 months ago)       Novolog  Current Insulin Orders: IV Insulin drip started at 1pm    -Discussed with patient all the treatments we are giving him for DKA.  Explained what DKA is, how we treat, labs, and transition plan.  -Patient  told me he ran out of Levemir and has just been taking Novolog.  Patient attempted to explain to me how he has been taking his Novolog, however, I did not fully understand how he has been taking it.  It sounds as if patient has been taking large doses of Novolog at different times throughout the day.  -Explained to patient that he needs basal insulin (Levemir) and that b/c he has Type 1 DM that he cannot manage his blood sugars properly without Levemir.  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  Not sure if patient understood all that I was saying.  Patient was fixated on getting his significant other to send him a picture of his Medicaid card so he could show me.  -Explained to patient we will likely discharge him home with Rxs for Levemir and Novolog and that he will need to follow up with his PCP or Endocrinologist soon after d/c.  Not sure who patient has been seeing.  Patient would not give me an exact name of his PCP or ENDO.  Does have CBG meter at home, but not sure how often he has been checking.      MD- Please consider checking A1c while patient in hospital to assess current glucose control at home.  Expect A1c results to be high.  When patient  ready to transition off IV insulin drip once labs have improved, acidosis reversed, Recommend the following based on weight-based insulin dosing:  1. Start Levemir 25 units daily- give 1st dose 1-2 hours before IV insulin drip stopped (this dose based on total daily dose of 45 units based on calculation of 56 kg X 0.8 units/kg total daily dosing- 50% basal, 50% bolus)  2. Start Novolog Sensitive SSI TID AC + HS  3. Start Novolog 4 units tidwc (meal coverage)    --Will follow patient during hospitalization--  Wyn Quaker RN, MSN, CDE Diabetes Coordinator Inpatient Glycemic Control Team Team Pager: [phone] (8a-5p)

## 2015-04-30 NOTE — Progress Notes (Signed)
Utilization review completed.  

## 2015-04-30 NOTE — ED Notes (Signed)
Per EMS, Pt, from girlfriend's home, c/o hyperglycemia and emesis starting this morning.  Pt reports that he has not had his insulin today.  Sts [place]  Hx of DM1.  CBG  430 en route.  200ML NS given en route.

## 2015-04-30 NOTE — Progress Notes (Signed)
Entered in d/c instructions [facility] Schedule an appointment as soon as possible for a visit on 04/30/2015 This is your assigned Medicaid [place] access doctor If you prefer another contact DSS [phone] DSS assigned your doctor *You may receive a bill if you go to any family Dr not assigned to you [address] [place] [address] [phone]  Medicaid [place] Access Covered Patient USE THIS WEBSITE TO ASSIST WITH UNDERSTANDING YOUR COVERAGE, [place] Transportation to Dr appts if you are have full Medicaid: [phone], [phone] or [phone] Transportation Supervisor [phone] As a Medicaid client you MUST contact DSS/SSI each time you change address, move to another [place] county or another state to keep your address updated  [place]: [phone] [address] [place], [place] [address] [email or web address]

## 2015-04-30 NOTE — ED Notes (Signed)
Insulin rate per Glucostabilizer.

## 2015-04-30 NOTE — ED Provider Notes (Signed)
CSN: [id number]     Arrival date & time 04/30/15  1001 History   First MD Initiated Contact with Patient 04/30/15 1023     Chief Complaint  Patient presents with  . Hyperglycemia  . Emesis     (Consider location/radiation/quality/duration/timing/severity/associated sxs/prior Treatment) HPI    29 year old male with hyperglycemia and nausea/vomiting. Hx of diabetes on insulin. Patient reports he gets his medications from a clinic in [facility]. He was previously taking Levemir and NovoLog as a sliding scale up until about 2 months ago when he ran out of the Levemir. Since that time he has been taking higher doses of the NovoLog and having a more difficut time in controlling his blood sugar. Visiting girlfriend and didn't bring his medications. Hasn't had any insulin in the past day. Increasing thirst. Nausea. No vomiting. No specific pain complaints, just feels generally achy. No fever.  Past Medical History  Diagnosis Date  . Diabetes mellitus without complication (HCC)    History reviewed. No pertinent past surgical history. Family History  Problem Relation Age of Onset  . Stroke Father   . CAD Other    Social History  Substance Use Topics  . Smoking status: Former Smoker    Types: Cigarettes  . Smokeless tobacco: Never Used  . Alcohol Use: Yes     Comment: social    Review of Systems  All systems reviewed and negative, other than as noted in HPI.   Allergies  Review of patient's allergies indicates no known allergies.  Home Medications   Prior to Admission medications   Medication Sig Start Date End Date Taking? Authorizing Provider  insulin aspart protamine- aspart (NOVOLOG 70/30) (70-30) 100 UNIT/ML injection Inject 0.2 mLs (20 Units total) into the skin 2 (two) times daily with a meal. 09/26/12  Yes [name], MD  Pseudoeph-Doxylamine-DM-APAP (NYQUIL PO) Take 2 tablets by mouth at bedtime as needed (sinus pressure).   Yes Historical Provider, MD   Pseudoephedrine-APAP-DM (DAYQUIL PO) Take 2 tablets by mouth daily as needed (sinus pressure).   Yes Historical Provider, MD  Pseudoephedrine-DM-GG-APAP (THERAFLU MAX-D COLD & FLU) [phone] MG PACK Take 1 each by mouth daily as needed (sinus pressure).   Yes Historical Provider, MD   BP 132/67 mmHg  Pulse 125  Temp(Src) 98.2 F (36.8 C) (Oral)  Resp 17  SpO2 100% Physical Exam  Constitutional: He appears well-developed and well-nourished. No distress.  HENT:  Head: Normocephalic and atraumatic.  Dry mucus membranes  Eyes: Conjunctivae are normal. Right eye exhibits no discharge. Left eye exhibits no discharge.  Neck: Neck supple.  Cardiovascular: Regular rhythm and normal heart sounds.  Exam reveals no gallop and no friction rub.   No murmur heard. tachycardic  Pulmonary/Chest:  [place]ul respirations  Abdominal: Soft. He exhibits no distension. There is no tenderness.  Musculoskeletal: He exhibits no edema or tenderness.  Neurological: He is alert.  Skin: Skin is warm and dry.  Psychiatric: He has a normal mood and affect. His behavior is normal. Thought content normal.  Nursing note and vitals reviewed.   ED Course  Procedures (including critical care time)  CRITICAL CARE Performed by: Raeford Razor Total critical care time: 35 minutes Critical care time was exclusive of separately billable procedures and treating other patients. Critical care was necessary to treat or prevent imminent or life-threatening deterioration. Critical care was time spent personally by me on the following activities: development of treatment plan with patient and/or surrogate as well as nursing, discussions with consultants, evaluation  of patient's response to treatment, examination of patient, obtaining history from patient or surrogate, ordering and performing treatments and interventions, ordering and review of laboratory studies, ordering and review of radiographic studies, pulse oximetry  and re-evaluation of patient's condition.  Labs Review Labs Reviewed  BASIC METABOLIC PANEL - Abnormal; Notable for the following:    Potassium 5.6 (*)    CO2 <7 (*)    Glucose, Bld 476 (*)    All other components within normal limits  CBC - Abnormal; Notable for the following:    WBC 13.1 (*)    All other components within normal limits  URINALYSIS, ROUTINE W REFLEX MICROSCOPIC (NOT AT [facility]) - Abnormal; Notable for the following:    Glucose, UA >1000 (*)    Ketones, ur >80 (*)    Protein, ur 30 (*)    All other components within normal limits  URINE MICROSCOPIC-ADD ON - Abnormal; Notable for the following:    Squamous Epithelial / LPF 0-5 (*)    Casts GRANULAR CAST (*)    All other components within normal limits  CBG MONITORING, ED - Abnormal; Notable for the following:    Glucose-Capillary 409 (*)    All other components within normal limits  CBG MONITORING, ED - Abnormal; Notable for the following:    Glucose-Capillary 385 (*)    All other components within normal limits    Imaging Review No results found. I have personally reviewed and evaluated these images and lab results as part of my medical decision-making.   EKG Interpretation None      MDM   Final diagnoses:  Diabetic ketoacidosis without coma associated with other specified diabetes mellitus (HCC)   29yM with DKA. Secondary to noncompliance and likely suboptimal insulin regimen. No obvious percipitant otherwise. Started with boluses of IV fluids. Insulin drip. Mild hyperkalemia and supplementation deferred at this time.  He is alert and oriented and answers questions appropriately. I do not feel he currently needs critical care consultation. Needs admission for ongoing treatment.     Raeford RazorStephen Shunte Senseney, MD 05/02/15 [phone]

## 2015-05-01 DIAGNOSIS — Z794 Long term (current) use of insulin: Secondary | ICD-10-CM

## 2015-05-01 DIAGNOSIS — E0810 Diabetes mellitus due to underlying condition with ketoacidosis without coma: Secondary | ICD-10-CM

## 2015-05-01 DIAGNOSIS — R000 Tachycardia, unspecified: Secondary | ICD-10-CM

## 2015-05-01 DIAGNOSIS — J069 Acute upper respiratory infection, unspecified: Secondary | ICD-10-CM

## 2015-05-01 DIAGNOSIS — E119 Type 2 diabetes mellitus without complications: Secondary | ICD-10-CM

## 2015-05-01 LAB — TSH: TSH: 0.874 u[IU]/mL (ref 0.350–4.500)

## 2015-05-01 LAB — BASIC METABOLIC PANEL
Anion gap: 9 (ref 5–15)
Anion gap: 8 (ref 5–15)
BUN: 6 mg/dL (ref 6–20)
BUN: 7 mg/dL (ref 6–20)
CALCIUM: 9 mg/dL (ref 8.9–10.3)
CALCIUM: 9.1 mg/dL (ref 8.9–10.3)
CO2: 16 mmol/L — AB (ref 22–32)
CO2: 17 mmol/L — AB (ref 22–32)
Chloride: 108 mmol/L (ref 101–111)
Chloride: 109 mmol/L (ref 101–111)
Creatinine, Ser: 0.66 mg/dL (ref 0.61–1.24)
Creatinine, Ser: 0.58 mg/dL — ABNORMAL LOW (ref 0.61–1.24)
GFR calc Af Amer: >60 mL/min (ref >60)
GFR calc Af Amer: >60 mL/min (ref >60)
GLUCOSE: 187 mg/dL — AB (ref 65–99)
GLUCOSE: 142 mg/dL — AB (ref 65–99)
Potassium: 3.6 mmol/L (ref 3.5–5.1)
Potassium: 3.5 mmol/L (ref 3.5–5.1)
Sodium: 133 mmol/L — ABNORMAL LOW (ref 135–145)
Sodium: 134 mmol/L — ABNORMAL LOW (ref 135–145)

## 2015-05-01 LAB — CBC
HCT: 36.3 % — ABNORMAL LOW (ref 39.0–52.0)
HEMOGLOBIN: 12.6 g/dL — AB (ref 13.0–17.0)
MCH: 30.8 pg (ref 26.0–34.0)
MCHC: 34.7 g/dL (ref 30.0–36.0)
MCV: 88.8 fL (ref 78.0–100.0)
PLATELETS: 223 10*3/uL (ref 150–400)
RBC: 4.09 MIL/uL — AB (ref 4.22–5.81)
RDW: 12.9 % (ref 11.5–15.5)
WBC: 6.8 10*3/uL (ref 4.0–10.5)

## 2015-05-01 LAB — GLUCOSE, CAPILLARY
GLUCOSE-CAPILLARY: 164 mg/dL — AB (ref 65–99)
GLUCOSE-CAPILLARY: 131 mg/dL — AB (ref 65–99)
Glucose-Capillary: 228 mg/dL — ABNORMAL HIGH (ref 65–99)
Glucose-Capillary: 228 mg/dL — ABNORMAL HIGH (ref 65–99)
Glucose-Capillary: 224 mg/dL — ABNORMAL HIGH (ref 65–99)

## 2015-05-01 LAB — INFLUENZA PANEL BY PCR (TYPE A & B)
H1N1 flu by pcr: NOT DETECTED
INFLBPCR: NEGATIVE
Influenza A By PCR: NEGATIVE

## 2015-05-01 MED ORDER — SODIUM CHLORIDE 0.9 % IV SOLN
INTRAVENOUS | Status: DC
  Administered 2015-05-01 – 2015-05-02 (×2): via INTRAVENOUS

## 2015-05-01 MED ORDER — FLUTICASONE PROPIONATE 50 MCG/ACT NA SUSP
2.0000 | Freq: Every day | NASAL | Status: DC
  Administered 2015-05-01 – 2015-05-02 (×2): 2 via NASAL
  Filled 2015-05-01 (×2): qty 16

## 2015-05-01 NOTE — Progress Notes (Signed)
PROGRESS NOTE  Shawn Maldonado ZOX:[id number] DOB: March 18, 1986 DOA: 04/30/2015 PCP: [facility]  HPI/Recap of past 24 hours: Feeling better, still some stuffy nose, no cough, no sore throat, no n/v, slight tachycardia  Assessment/Plan: Principal Problem:   DKA (diabetic ketoacidoses) (HCC) Active Problems:   Diabetes mellitus (HCC)   Acute upper respiratory infection  DKA (diabetic ketoacidoses) (HCC) in a type I diabetic - Status post IV fluid bolus in the ER consisting of normal saline x2 liters, continue to vigorously hydrate with normal saline. - Start on insulin drip per glucose stabilizer protocol with every hour CBG checks and every 4 hour BMET checks until stable. - Supplement potassium if needed. Currently no supplementation required. - Add dextrose to IV fluids when serum glucose less than 250. - Transition to basal/bolus insulin when the ketoacidosis has resolved. Continue IV insulin infusion for one to two hours after initiating the SQ insulin, to avoid recurrent hyperglycemia. - DM coordinator consultation requested. 12/31: off insulin drip, transitioned to subQ insulin, better continue ivf, transfer to floor.  Active problems:   Upper respiratory infection - Treat symptomatically with Afrin for now. -flu swab negative  Code Status: Full. Family Communication: Shawn Maldonado (girlfriend) [phone] is his emergency contact. Disposition Plan: Home, likely 12/31    Consultants:  none  Procedures:  none  Antibiotics:  none   Objective: BP 117/72 mmHg  Pulse 92  Temp(Src) 98.4 F (36.9 C) (Oral)  Resp 18  Ht  (1.753 m)  Wt 120 lb (54.432 kg)  BMI 17.71 kg/m2  SpO2 100%  Intake/Output Summary (Last 24 hours) at 05/01/15 1839 Last data filed at 05/01/15 1600  Gross per 24 hour  Intake 2554.27 ml  Output   1900 ml  Net 654.27 ml   Filed Weights   05/01/15 1835  Weight: 120 lb (54.432 kg)    Exam:   General:   NAD  Cardiovascular: remain slightly sinus tachycardia, but much improved   Respiratory: CTABL  Abdomen: Soft/ND/NT, positive BS  Musculoskeletal: No Edema  Neuro: aaox3  Data Reviewed: Basic Metabolic Panel:  Recent Labs Lab 04/30/15 1108 04/30/15 1911 04/30/15 2231 05/01/15 0232 05/01/15 0640  NA 135 137 135 133* 134*  K 5.6* 4.5 3.8 3.6 3.5  CL 102 111 109 108 109  CO2 <7* 13* 15* 16* 17*  GLUCOSE 476* 145* 203* 187* 142*  BUN CREATININE 1.13 0.96 0.76 0.66 0.58*  CALCIUM 9.3 9.1 9.0 9.0 9.1  MG 2.1  --   --   --   --   PHOS 5.7*  --   --   --   --    Liver Function Tests: No results for input(s): AST, ALT, ALKPHOS, BILITOT, PROT, ALBUMIN in the last 168 hours. No results for input(s): LIPASE, AMYLASE in the last 168 hours. No results for input(s): AMMONIA in the last 168 hours. CBC:  Recent Labs Lab 04/30/15 1108 05/01/15 1024  WBC 13.1* 6.8  HGB 15.0 12.6*  HCT 43.4 36.3*  MCV 91.6 88.8  PLT 289 223   Cardiac Enzymes:   No results for input(s): CKTOTAL, CKMB, CKMBINDEX, TROPONINI in the last 168 hours. BNP (last 3 results) No results for input(s): BNP in the last 8760 hours.  ProBNP (last 3 results) No results for input(s): PROBNP in the last 8760 hours.  CBG:  Recent Labs Lab 04/30/15 2253 04/30/15 2356 05/01/15 0816 05/01/15 1202 05/01/15 1645  GLUCAP 164* 173* 131* 228* 228*  Recent Results (from the past 240 hour(s))  MRSA PCR Screening     Status: Abnormal   Collection Time: 04/30/15  2:42 PM  Result Value Ref Range Status   MRSA by PCR INVALID RESULTS, SPECIMEN SENT FOR CULTURE (A) NEGATIVE Final    Comment:        The GeneXpert MRSA Assay (FDA approved for NASAL specimens only), is one component of a comprehensive MRSA colonization surveillance program. It is not intended to diagnose MRSA infection nor to guide or monitor treatment for MRSA infections.      Studies: No results found.  Scheduled  Meds: . enoxaparin (LOVENOX) injection  40 mg Subcutaneous Q24H  . fluticasone  2 spray Each Nare Daily  . insulin aspart  0-5 Units Subcutaneous QHS  . insulin aspart  0-9 Units Subcutaneous TID WC  . insulin aspart  4 Units Subcutaneous TID WC  . insulin detemir  25 Units Subcutaneous QHS  . oxymetazoline  1 spray Each Nare BID    Continuous Infusions: . sodium chloride 75 mL/hr at 05/01/15 0215     Time spent: 25mins  [name] MD, PhD  [facility] Pager [phone]. If 7PM-7AM, please contact night-coverage at [email or web address], password [email or web address] 05/01/2015, 6:39 PM  LOS: 1 day

## 2015-05-02 DIAGNOSIS — E876 Hypokalemia: Secondary | ICD-10-CM

## 2015-05-02 LAB — CBC
HEMATOCRIT: 33.8 % — AB (ref 39.0–52.0)
HEMOGLOBIN: 11.9 g/dL — AB (ref 13.0–17.0)
MCH: 31.5 pg (ref 26.0–34.0)
MCHC: 35.2 g/dL (ref 30.0–36.0)
MCV: 89.4 fL (ref 78.0–100.0)
Platelets: 207 10*3/uL (ref 150–400)
RBC: 3.78 MIL/uL — AB (ref 4.22–5.81)
RDW: 12.9 % (ref 11.5–15.5)
WBC: 6.4 10*3/uL (ref 4.0–10.5)

## 2015-05-02 LAB — COMPREHENSIVE METABOLIC PANEL
ALBUMIN: 3.3 g/dL — AB (ref 3.5–5.0)
ALK PHOS: 71 U/L (ref 38–126)
ALT: 11 U/L — AB (ref 17–63)
AST: 16 U/L (ref 15–41)
Anion gap: 8 (ref 5–15)
BILIRUBIN TOTAL: 0.8 mg/dL (ref 0.3–1.2)
BUN: 6 mg/dL (ref 6–20)
CALCIUM: 8.7 mg/dL — AB (ref 8.9–10.3)
CO2: 23 mmol/L (ref 22–32)
CREATININE: 0.57 mg/dL — AB (ref 0.61–1.24)
Chloride: 108 mmol/L (ref 101–111)
GFR calc Af Amer: >60 mL/min (ref >60)
GLUCOSE: 127 mg/dL — AB (ref 65–99)
Potassium: 2.7 mmol/L — CL (ref 3.5–5.1)
Sodium: 139 mmol/L (ref 135–145)
TOTAL PROTEIN: 6.5 g/dL (ref 6.5–8.1)

## 2015-05-02 LAB — BASIC METABOLIC PANEL
Anion gap: 8 (ref 5–15)
BUN: 6 mg/dL (ref 6–20)
CALCIUM: 8.4 mg/dL — AB (ref 8.9–10.3)
CHLORIDE: 105 mmol/L (ref 101–111)
CO2: 27 mmol/L (ref 22–32)
CREATININE: 0.42 mg/dL — AB (ref 0.61–1.24)
GFR calc non Af Amer: >60 mL/min (ref >60)
Glucose, Bld: 114 mg/dL — ABNORMAL HIGH (ref 65–99)
Potassium: 3.4 mmol/L — ABNORMAL LOW (ref 3.5–5.1)
SODIUM: 140 mmol/L (ref 135–145)

## 2015-05-02 LAB — MRSA CULTURE

## 2015-05-02 LAB — MAGNESIUM: MAGNESIUM: 1.5 mg/dL — AB (ref 1.7–2.4)

## 2015-05-02 LAB — GLUCOSE, CAPILLARY
GLUCOSE-CAPILLARY: 210 mg/dL — AB (ref 65–99)
GLUCOSE-CAPILLARY: 117 mg/dL — AB (ref 65–99)
Glucose-Capillary: 98 mg/dL (ref 65–99)

## 2015-05-02 MED ORDER — INSULIN ASPART 100 UNIT/ML ~~LOC~~ SOLN: 4.0000 [IU] | Freq: Three times a day (TID) | SUBCUTANEOUS | Status: DC

## 2015-05-02 MED ORDER — POTASSIUM CHLORIDE 10 MEQ/100ML IV SOLN
10.0000 meq | INTRAVENOUS | Status: AC
  Administered 2015-05-02 (×4): 10 meq via INTRAVENOUS
  Filled 2015-05-02 (×4): qty 100

## 2015-05-02 MED ORDER — MAGNESIUM SULFATE 2 GM/50ML IV SOLN
2.0000 g | Freq: Once | INTRAVENOUS | Status: AC
  Administered 2015-05-02: 2 g via INTRAVENOUS
  Filled 2015-05-02: qty 50

## 2015-05-02 MED ORDER — CIPROFLOXACIN HCL 500 MG PO TABS: 500.0000 mg | ORAL_TABLET | Freq: Two times a day (BID) | ORAL | Status: DC

## 2015-05-02 MED ORDER — POTASSIUM CHLORIDE CRYS ER 20 MEQ PO TBCR
40.0000 meq | EXTENDED_RELEASE_TABLET | Freq: Once | ORAL | Status: AC
  Administered 2015-05-02: 40 meq via ORAL
  Filled 2015-05-02: qty 2

## 2015-05-02 MED ORDER — INSULIN DETEMIR 100 UNIT/ML ~~LOC~~ SOLN: 15.0000 [IU] | Freq: Two times a day (BID) | SUBCUTANEOUS | Status: DC

## 2015-05-02 NOTE — Progress Notes (Signed)
CRITICAL VALUE ALERT  Critical value received:  6:18 AM   Date of notification:  05/02/2015   Time of notification:  6:18 AM   Critical value read back:Yes.    Nurse who received alert:  Verdene Rioachel Corcoran, RN  MD notified (1st page):  L.[email or web address]  Time of first page:  6:19 AM   MD notified (2nd page):  Time of second page:  Responding MD:  L.Harduk  Time MD responded:  6:23 AM

## 2015-05-02 NOTE — Discharge Summary (Signed)
Discharge Summary  Shawn Maldonado ZOX:[id number] DOB: [birth year]  PCP: [facility]  Admit date: 04/30/2015 Discharge date: 05/02/2015  Time spent: >29mins  Recommendations for Outpatient Follow-up:  1. F/u with PMD within a week for hospital discharge follow up, repeat cbc/bmp at follow up, pmd to continue monitor blood sugar control and meds  Compliance.  Discharge Diagnoses:  Active Hospital Problems   Diagnosis Date Noted  . DKA (diabetic ketoacidoses) (HCC) 09/24/2012  . Acute upper respiratory infection 04/30/2015  . Diabetes mellitus (HCC) 09/24/2012    Resolved Hospital Problems   Diagnosis Date Noted Date Resolved  No resolved problems to display.    Discharge Condition: stable  Diet recommendation: heart healthy/carb modified  Filed Weights   05/01/15 1835  Weight: 120 lb (54.432 kg)    History of present illness:  Shawn Maldonado is an 30 y.o. male with a PMH of type 1 diabetes diagnosed in his 67s, managed with a combination of Levemir and NovoLog, who states he ran out of his Levemir approximately 2 months ago. He has been managing his diabetes with NovoLog using increased doses. Patient reports that he developed sinus congestion 24 hours ago. He took NyQuil with no relief and then awoke at 4 AM with nausea and vomiting. He became very thirsty, felt worse and "felt out of place "with a racing heart. He says he has not taken any insulin in the past 24 hours because he left it at work. Upon initial evaluation in the ED, the patient was found to be in DKA with a bicarbonate of less than 7 and an anion gap of greater than 20.  Hospital Course:  Principal Problem:   DKA (diabetic ketoacidoses) (HCC) Active Problems:   Diabetes mellitus (HCC)   Acute upper respiratory infection  DKA (diabetic ketoacidoses) (HCC) in a type I diabetic - Status post IV fluid bolus in the ER consisting of normal saline x2 liters, continue to vigorously hydrate with normal saline. -  Start on insulin drip per glucose stabilizer protocol with every hour CBG checks and every 4 hour BMET checks until stable. - Supplement potassium if needed. Currently no supplementation required. - Add dextrose to IV fluids when serum glucose less than 250. - Transition to basal/bolus insulin when the ketoacidosis has resolved. Continue IV insulin infusion for one to two hours after initiating the SQ insulin, to avoid recurrent hyperglycemia. - DM coordinator consultation requested. 12/31: off insulin drip, transitioned to subQ insulin, better continue ivf, transfer to floor.  Insulin dependent type II diabetes: a1c pending, patient has not been compliant with meds, reported run out all insulin for the last two months, he reported he takes levemir 15unit bid and novolog tid, prescription with refills provided.     Upper respiratory infection - Treat symptomatically with Afrin for now. -flu swab negative  Penile foreskin infection: cipro provided.   Hypokalemia/hypomagnesemia: k/mag replaced, repeat bmp at 4pm, better, can be discharged home  Medication and medical follow up noncompliance, education provided, follow up information provided.  Code Status: Full. Family Communication: Bonnielee Haff (girlfriend) in room, [phone] is his emergency contact. Disposition Plan: Home12/31    Consultants:  none  Procedures:  none  Antibiotics:  none  Discharge Exam: BP 122/64 mmHg  Pulse 85  Temp(Src) 98.2 F (36.8 C) (Oral)  Resp 18  Ht 5\' 9"  (1.753 m)  Wt 120 lb (54.432 kg)  BMI 17.71 kg/m2  SpO2 100%   General: NAD  Cardiovascular: sinus tachycardia resolved, no NSR  Respiratory:  CTABL  Abdomen: Soft/ND/NT, positive BS  Musculoskeletal: No Edema  Neuro: aaox3   Discharge Instructions You were cared for by a hospitalist during your hospital stay. If you have any questions about your discharge medications or the care you received while you were in the  hospital after you are discharged, you can call the unit and asked to speak with the hospitalist on call if the hospitalist that took care of you is not available. Once you are discharged, your primary care physician will handle any further medical issues. Please note that NO REFILLS for any discharge medications will be authorized once you are discharged, as it is imperative that you return to your primary care physician (or establish a relationship with a primary care physician if you do not have one) for your aftercare needs so that they can reassess your need for medications and monitor your lab values.     Medication List    TAKE these medications        ciprofloxacin 500 MG tablet  Commonly known as:  CIPRO  Take 1 tablet (500 mg total) by mouth 2 (two) times daily.     insulin aspart 100 UNIT/ML injection  Commonly known as:  novoLOG  Inject 4 Units into the skin 3 (three) times daily with meals.     insulin detemir 100 UNIT/ML injection  Commonly known as:  LEVEMIR  Inject 0.15 mLs (15 Units total) into the skin 2 (two) times daily.      ASK your doctor about these medications        DAYQUIL PO  Take 2 tablets by mouth daily as needed (sinus pressure).     insulin aspart protamine- aspart (70-30) 100 UNIT/ML injection  Commonly known as:  NOVOLOG MIX 70/30  Inject 0.2 mLs (20 Units total) into the skin 2 (two) times daily with a meal.     NYQUIL PO  Take 2 tablets by mouth at bedtime as needed (sinus pressure).     THERAFLU MAX-D COLD & FLU [id number] MG Pack  Generic drug:  Pseudoephedrine-DM-GG-APAP  Take 1 each by mouth daily as needed (sinus pressure).       No Known Allergies     Follow-up Information    Follow up with [facility]. Schedule an appointment as soon as possible for a visit on 04/30/2015.   Specialty:  Pediatrics   Why:  This is your assigned Medicaid [place] access doctor If you prefer another contact DSS [phone] DSS assigned  your doctor *You may receive a bill if you go to any family Dr not assigned to you   Contact information:   [address] [place] [address] [phone]       Follow up with Medicaid [place] Access Covered Patient .   Why:  USE THIS WEBSITE TO ASSIST WITH UNDERSTANDING YOUR COVERAGE, [place] Transportation to Dr appts if you are have full Medicaid: [phone], [phone] or [phone] Transportation Supervisor [phone]    Contact information:   As a Medicaid client you MUST contact DSS/SSI each time you change address, move to another [place] county or another state to keep your address updated  [place]: [phone] [address] [place], [place] [address] [email or web address]        The results of significant diagnostics from this hospitalization (including imaging, microbiology, ancillary and laboratory) are listed below for reference.    Significant Diagnostic Studies: No results found.  Microbiology: Recent Results (from the past 240  hour(s))  MRSA PCR Screening     Status: Abnormal   Collection Time: 04/30/15  2:42 PM  Result Value Ref Range Status   MRSA by PCR INVALID RESULTS, SPECIMEN SENT FOR CULTURE (A) NEGATIVE Final    Comment:        The GeneXpert MRSA Assay (FDA approved for NASAL specimens only), is one component of a comprehensive MRSA colonization surveillance program. It is not intended to diagnose MRSA infection nor to guide or monitor treatment for MRSA infections.      Labs: Basic Metabolic Panel:  Recent Labs Lab 04/30/15 1108 04/30/15 1911 04/30/15 2231 05/01/15 0232 05/01/15 0640 05/02/15 0511  NA 135 137 135 133* 134* 139  K 5.6* 4.5 3.8 3.6 3.5 2.7*  CL 102 111 109 108 109 108  CO2 <7* 13* 15* 16* 17* 23  GLUCOSE 476* 145* 203* 187* 142* 127*  BUN 13 8 7 6 7 6   CREATININE 1.13 0.96 0.76 0.66 0.58* 0.57*  CALCIUM 9.3 9.1 9.0 9.0 9.1 8.7*  MG 2.1  --   --   --   --  1.5*  PHOS 5.7*  --   --    --   --   --    Liver Function Tests:  Recent Labs Lab 05/02/15 0511  AST 16  ALT 11*  ALKPHOS 71  BILITOT 0.8  PROT 6.5  ALBUMIN 3.3*   No results for input(s): LIPASE, AMYLASE in the last 168 hours. No results for input(s): AMMONIA in the last 168 hours. CBC:  Recent Labs Lab 04/30/15 1108 05/01/15 1024 05/02/15 0511  WBC 13.1* 6.8 6.4  HGB 15.0 12.6* 11.9*  HCT 43.4 36.3* 33.8*  MCV 91.6 88.8 89.4  PLT 289 223 207   Cardiac Enzymes: No results for input(s): CKTOTAL, CKMB, CKMBINDEX, TROPONINI in the last 168 hours. BNP: BNP (last 3 results) No results for input(s): BNP in the last 8760 hours.  ProBNP (last 3 results) No results for input(s): PROBNP in the last 8760 hours.  CBG:  Recent Labs Lab 05/01/15 0816 05/01/15 1202 05/01/15 1645 05/01/15 2123 05/02/15 0747  GLUCAP 131* 228* 228* 224* 98       Signed:  Analiz Tvedt MD, PhD  [facility] 05/02/2015, 12:04 PM

## 2015-05-04 LAB — HEMOGLOBIN A1C
Hgb A1c MFr Bld: 14 % — ABNORMAL HIGH (ref 4.8–5.6)
Mean Plasma Glucose: 355 mg/dL

## 2015-05-04 LAB — RESPIRATORY VIRUS PANEL
Adenovirus: NEGATIVE
INFLUENZA A: NEGATIVE
INFLUENZA B 1: NEGATIVE
METAPNEUMOVIRUS: NEGATIVE
PARAINFLUENZA 1 A: NEGATIVE
Parainfluenza 2: NEGATIVE
Parainfluenza 3: NEGATIVE
RESPIRATORY SYNCYTIAL VIRUS A: NEGATIVE
Respiratory Syncytial Virus B: NEGATIVE
Rhinovirus: NEGATIVE

## 2015-06-21 ENCOUNTER — Emergency Department (HOSPITAL_COMMUNITY)
Admission: EM | Admit: 2015-06-21 | Discharge: 2015-06-21 | Disposition: A | Discharge status: Home / Self Care | Payer: Medicaid Other | Attending: Emergency Medicine | Admitting: Emergency Medicine

## 2015-06-21 ENCOUNTER — Encounter (HOSPITAL_COMMUNITY): Payer: Self-pay | Admitting: Emergency Medicine

## 2015-06-21 DIAGNOSIS — Z760 Encounter for issue of repeat prescription: Secondary | ICD-10-CM | POA: Insufficient documentation

## 2015-06-21 DIAGNOSIS — Z87891 Personal history of nicotine dependence: Secondary | ICD-10-CM | POA: Diagnosis not present

## 2015-06-21 DIAGNOSIS — E119 Type 2 diabetes mellitus without complications: Secondary | ICD-10-CM | POA: Insufficient documentation

## 2015-06-21 DIAGNOSIS — Z794 Long term (current) use of insulin: Secondary | ICD-10-CM | POA: Diagnosis not present

## 2015-06-21 DIAGNOSIS — G478 Other sleep disorders: Secondary | ICD-10-CM | POA: Diagnosis not present

## 2015-06-21 DIAGNOSIS — Z792 Long term (current) use of antibiotics: Secondary | ICD-10-CM | POA: Diagnosis not present

## 2015-06-21 DIAGNOSIS — G629 Polyneuropathy, unspecified: Secondary | ICD-10-CM | POA: Diagnosis not present

## 2015-06-21 DIAGNOSIS — Z79899 Other long term (current) drug therapy: Secondary | ICD-10-CM | POA: Insufficient documentation

## 2015-06-21 LAB — CBG MONITORING, ED: Glucose-Capillary: 90 mg/dL (ref 65–99)

## 2015-06-21 MED ORDER — PREGABALIN 25 MG PO CAPS: 25.0000 mg | ORAL_CAPSULE | Freq: Three times a day (TID) | ORAL | Status: DC

## 2015-06-21 NOTE — Progress Notes (Addendum)
EDCM received phone call from patient reporting he does not have any Lyrica tablets.  Patient was just seen in the ED today and discharged with Lyrica  60 tablets.  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 Will not fill his prescription because it is too soon to fill.  EDCM spoke to [facility] the pharmacist at [facility] who reports patient has just filled a prescription for Lyrica  on 02/06.  She is unable to provide patient with a few tablets to last him until Thursday when he sees his doctor.  Judeth Cornfield reports if it is written for a different dosage, she would be able to fill it.  Judeth Cornfield reports patient stated his pcp instructed him to increase his dosage if he was still having pain.  Pharmacist states the patient reports he has been taking  three times a day.  EDCM asked the pharmacist to tell the patient to contact his pcp at [facility] tomorrow to ask them to call Medicaid to perform an overide for this prescription and then call it into the pharmacy, or prescribe him something else and call into the pharmacy.  Judeth Cornfield the pharmacist reports she will inform the patient to do this.  No further EDCM needs at this time.

## 2015-06-21 NOTE — ED Provider Notes (Signed)
CSN: [id number]     Arrival date & time 06/21/15  1534 History  By signing my name below, I, Shawn Maldonado, attest that this documentation has been prepared under the direction and in the presence of Barrett Henle, [place]. Electronically Signed: Placido Maldonado, ED Scribe. 06/21/2015. 5:27 PM.    Chief Complaint  Patient presents with  . Peripheral Neuropathy  . Medication Refill   The history is provided by the patient. No language interpreter was used.    HPI Comments: Shawn Maldonado is a 30 y.o. male with a PMHx of DM and neuropathy who presents to the Emergency Department complaining of worsening, moderate, bilateral, burning/stabbing, leg pain consistent with prior exacerbations of neuropathy onset 2 days ago. Pt was prescribed Lyrica by internal medicine 1 month ago and could not get a follow up appointment for 3 days noting that he ran out of his medication 2 days ago. He was taking 25 mg, 3x per day. He notes associated difficulty sleeping due to pain. He confirms monitoring his glucose levels at home. Pt denies any swelling, warmth or any other new or associated symptoms at this time. Patient reports he has an appointment scheduled with his PCP in 3 days (06/24/15).  PCP: [facility] Avanell Shackleton GNP-BC)  Past Medical History  Diagnosis Date  . Diabetes mellitus without complication [facility])    Past Surgical History  Procedure Laterality Date  . Mouth surgery     Family History  Problem Relation Age of Onset  . Stroke Father   . CAD Other    Social History  Substance Use Topics  . Smoking status: Former Smoker    Types: Cigarettes  . Smokeless tobacco: Never Used  . Alcohol Use: Yes     Comment: social    Review of Systems  Constitutional: Negative for fever.  Musculoskeletal: Positive for myalgias.  Skin: Negative for color change and wound.  Neurological: Positive for numbness. Negative for weakness.  Psychiatric/Behavioral: Positive for sleep  disturbance.    Allergies  Review of patient's allergies indicates no known allergies.  Home Medications   Prior to Admission medications   Medication Sig Start Date End Date Taking? Authorizing Provider  ciprofloxacin (CIPRO) 500 MG tablet Take 1 tablet (500 mg total) by mouth 2 (two) times daily. 05/02/15   Albertine Grates, MD  insulin aspart (NOVOLOG) 100 UNIT/ML injection Inject 4 Units into the skin 3 (three) times daily with meals. 05/02/15   Albertine Grates, MD  insulin detemir (LEVEMIR) 100 UNIT/ML injection Inject 0.15 mLs (15 Units total) into the skin 2 (two) times daily. 05/02/15   Albertine Grates, MD  pregabalin (LYRICA) 25 MG capsule Take 1 capsule (25 mg total) by mouth 3 (three) times daily. 06/21/15   Satira Sark Zakery Normington, PA-C   BP 131/85 mmHg  Pulse 103  Temp(Src) 98.4 F (36.9 C) (Oral)  Resp 18  SpO2 100%    Physical Exam  Constitutional: He is oriented to person, place, and time. He appears well-developed and well-nourished.  HENT:  Head: Normocephalic and atraumatic.  Eyes: Conjunctivae and EOM are normal. Right eye exhibits no discharge. Left eye exhibits no discharge. No scleral icterus.  Neck: Normal range of motion. Neck supple.  Cardiovascular: Normal rate, regular rhythm, normal heart sounds and intact distal pulses.   Pulmonary/Chest: Effort normal and breath sounds normal. No respiratory distress. He has no wheezes. He has no rales. He exhibits no tenderness.  Abdominal: Soft.  Musculoskeletal: Normal range of motion.  FROM  of bilateral LEs; 5/5 strength; sensation grossly intact; 2+ DP pulses; cap refill <2 seconds; pt able to stand and ambulate without assistance; no ataxia noted; no swelling, erythema, warmth or wounds/lesions noted  Neurological: He is alert and oriented to person, place, and time.  Skin: Skin is warm and dry.  Psychiatric: He has a normal mood and affect.  Nursing note and vitals reviewed.   ED Course  Procedures  DIAGNOSTIC STUDIES: Oxygen  Saturation is 100% on RA, normal by my interpretation.    COORDINATION OF CARE: 5:06 PM Discussed next steps with pt. He verbalized understanding and is agreeable with the plan.   Labs Review Labs Reviewed  CBG MONITORING, ED    Imaging Review No results found. I have personally reviewed and evaluated these lab results as part of my medical decision-making.    MDM   Final diagnoses:  Medication refill    Patient presents for refill of his Lyrica. History of diabetes and peripheral neuropathy. He notes he ran out of his medication 2 days ago and has began having worsening neuropathy. Patient's next scheduled appointment with his PCP is in 3 days. VSS. Exam unremarkable, no neuro deficits, bilateral lower extremities neurovascularly intact. Plan to discharge patient with 1 month supply of Lyrica medication. Advised patient that he still needs to follow-up with his PCP at his scheduled appointment this week.  Evaluation does not show pathology requring ongoing emergent intervention or admission. Pt is hemodynamically stable and mentating appropriately. Discussed findings/results and plan with patient/guardian, who agrees with plan. All questions answered. Return precautions discussed and outpatient follow up given.    I personally performed the services described in this documentation, which was scribed in my presence. The recorded information has been reviewed and is accurate.    Satira Sark [place], [place] 06/21/15 1828  Bethann Berkshire, MD 06/21/15 [phone]

## 2015-06-21 NOTE — ED Notes (Signed)
CBG 90 in triage 

## 2015-06-21 NOTE — Discharge Instructions (Signed)
Continue taking your medication as prescribed. Follow-up with your primary care provider at your scheduled appointment on Thursday (06/24/15). Please return to the Emergency Department if symptoms worsen or new onset of fever, redness, swelling, numbness, tingling, weakness.

## 2015-06-21 NOTE — ED Notes (Signed)
Pt complaint of worsening bilateral lower leg neuropathy related to our of lyrica; PCP unable to see pt today.

## 2015-08-13 ENCOUNTER — Inpatient Hospital Stay (HOSPITAL_COMMUNITY)
Admission: EM | Admit: 2015-08-13 | Discharge: 2015-08-19 | DRG: 638 | Disposition: A | Discharge status: Home / Self Care | Payer: Medicaid Other | Attending: Internal Medicine | Admitting: Internal Medicine

## 2015-08-13 ENCOUNTER — Encounter (HOSPITAL_COMMUNITY): Payer: Self-pay

## 2015-08-13 DIAGNOSIS — G629 Polyneuropathy, unspecified: Secondary | ICD-10-CM

## 2015-08-13 DIAGNOSIS — R112 Nausea with vomiting, unspecified: Secondary | ICD-10-CM | POA: Diagnosis present

## 2015-08-13 DIAGNOSIS — K529 Noninfective gastroenteritis and colitis, unspecified: Secondary | ICD-10-CM | POA: Diagnosis present

## 2015-08-13 DIAGNOSIS — F1193 Opioid use, unspecified with withdrawal: Secondary | ICD-10-CM | POA: Insufficient documentation

## 2015-08-13 DIAGNOSIS — K3184 Gastroparesis: Secondary | ICD-10-CM | POA: Diagnosis present

## 2015-08-13 DIAGNOSIS — R17 Unspecified jaundice: Secondary | ICD-10-CM | POA: Diagnosis present

## 2015-08-13 DIAGNOSIS — E1010 Type 1 diabetes mellitus with ketoacidosis without coma: Principal | ICD-10-CM | POA: Insufficient documentation

## 2015-08-13 DIAGNOSIS — F1123 Opioid dependence with withdrawal: Secondary | ICD-10-CM | POA: Diagnosis present

## 2015-08-13 DIAGNOSIS — E1043 Type 1 diabetes mellitus with diabetic autonomic (poly)neuropathy: Secondary | ICD-10-CM | POA: Diagnosis present

## 2015-08-13 DIAGNOSIS — Z794 Long term (current) use of insulin: Secondary | ICD-10-CM

## 2015-08-13 DIAGNOSIS — E1042 Type 1 diabetes mellitus with diabetic polyneuropathy: Secondary | ICD-10-CM | POA: Diagnosis present

## 2015-08-13 DIAGNOSIS — R1110 Vomiting, unspecified: Secondary | ICD-10-CM | POA: Insufficient documentation

## 2015-08-13 DIAGNOSIS — E1110 Type 2 diabetes mellitus with ketoacidosis without coma: Secondary | ICD-10-CM | POA: Diagnosis present

## 2015-08-13 DIAGNOSIS — E861 Hypovolemia: Secondary | ICD-10-CM | POA: Diagnosis present

## 2015-08-13 DIAGNOSIS — E109 Type 1 diabetes mellitus without complications: Secondary | ICD-10-CM | POA: Diagnosis present

## 2015-08-13 DIAGNOSIS — Z87891 Personal history of nicotine dependence: Secondary | ICD-10-CM

## 2015-08-13 LAB — CBC
HCT: 42.4 % (ref 39.0–52.0)
Hemoglobin: 15.3 g/dL (ref 13.0–17.0)
MCH: 29.7 pg (ref 26.0–34.0)
MCHC: 36.1 g/dL — ABNORMAL HIGH (ref 30.0–36.0)
MCV: 82.2 fL (ref 78.0–100.0)
PLATELETS: 256 10*3/uL (ref 150–400)
RBC: 5.16 MIL/uL (ref 4.22–5.81)
RDW: 11.7 % (ref 11.5–15.5)
WBC: 6.4 10*3/uL (ref 4.0–10.5)

## 2015-08-13 LAB — BLOOD GAS, VENOUS
Acid-base deficit: 8 mmol/L — ABNORMAL HIGH (ref 0.0–2.0)
BICARBONATE: 17.3 meq/L — AB (ref 20.0–24.0)
Drawn by: 409391
O2 Content: 2 L/min
O2 Saturation: 97.3 %
Patient temperature: 98.6
TCO2: 15.4 mmol/L (ref 0–100)
pCO2, Ven: 36.3 mmHg — ABNORMAL LOW (ref 45.0–50.0)
pH, Ven: 7.3 (ref 7.250–7.300)
pO2, Ven: 113 mmHg — ABNORMAL HIGH (ref 31.0–45.0)

## 2015-08-13 LAB — URINALYSIS, ROUTINE W REFLEX MICROSCOPIC
Bilirubin Urine: NEGATIVE
HGB URINE DIPSTICK: NEGATIVE
Ketones, ur: >80 mg/dL — AB
Leukocytes, UA: NEGATIVE
Nitrite: NEGATIVE
PROTEIN: NEGATIVE mg/dL
Specific Gravity, Urine: 1.04 — ABNORMAL HIGH (ref 1.005–1.030)
pH: 5 (ref 5.0–8.0)

## 2015-08-13 LAB — BASIC METABOLIC PANEL
Anion gap: 19 — ABNORMAL HIGH (ref 5–15)
BUN: 15 mg/dL (ref 6–20)
CALCIUM: 10 mg/dL (ref 8.9–10.3)
CHLORIDE: 103 mmol/L (ref 101–111)
CO2: 19 mmol/L — AB (ref 22–32)
CREATININE: 0.88 mg/dL (ref 0.61–1.24)
GFR calc Af Amer: >60 mL/min (ref >60)
GFR calc non Af Amer: >60 mL/min (ref >60)
GLUCOSE: 372 mg/dL — AB (ref 65–99)
Potassium: 4.3 mmol/L (ref 3.5–5.1)
Sodium: 141 mmol/L (ref 135–145)

## 2015-08-13 LAB — LIPASE, BLOOD: Lipase: 17 U/L (ref 11–51)

## 2015-08-13 LAB — CBG MONITORING, ED: GLUCOSE-CAPILLARY: 330 mg/dL — AB (ref 65–99)

## 2015-08-13 LAB — URINE MICROSCOPIC-ADD ON
BACTERIA UA: NONE SEEN
RBC / HPF: NONE SEEN RBC/hpf (ref 0–5)
WBC UA: NONE SEEN WBC/hpf (ref 0–5)

## 2015-08-13 MED ORDER — HYDROMORPHONE HCL 1 MG/ML IJ SOLN
1.0000 mg | Freq: Once | INTRAMUSCULAR | Status: AC
  Administered 2015-08-13: 1 mg via INTRAVENOUS
  Filled 2015-08-13: qty 1

## 2015-08-13 MED ORDER — ONDANSETRON 4 MG PO TBDP
4.0000 mg | ORAL_TABLET | Freq: Once | ORAL | Status: AC | PRN
  Administered 2015-08-13: 4 mg via ORAL
  Filled 2015-08-13: qty 1

## 2015-08-13 MED ORDER — SODIUM CHLORIDE 0.9 % IV BOLUS (SEPSIS)
1000.0000 mL | Freq: Once | INTRAVENOUS | Status: AC
  Administered 2015-08-13: 1000 mL via INTRAVENOUS

## 2015-08-13 MED ORDER — ONDANSETRON HCL 4 MG/2ML IJ SOLN
4.0000 mg | Freq: Once | INTRAMUSCULAR | Status: AC
  Administered 2015-08-13: 4 mg via INTRAVENOUS
  Filled 2015-08-13: qty 2

## 2015-08-13 NOTE — ED Provider Notes (Addendum)
CSN: [id number]     Arrival date & time 08/13/15  2040 History   First MD Initiated Contact with Patient 08/13/15 2134     Chief Complaint  Patient presents with  . Hyperglycemia     (Consider location/radiation/quality/duration/timing/severity/associated sxs/prior Treatment) Patient is a 30 y.o. male presenting with hyperglycemia. The history is provided by the patient.  Hyperglycemia Blood sugar level PTA:  372 Severity:  Moderate Onset quality:  Gradual Duration:  1 day Timing:  Constant Progression:  Worsening Chronicity:  New Diabetes status:  Controlled with insulin Current diabetic therapy:  Insulin Time since last antidiabetic medication: today. Context: not change in medication and not noncompliance   Context comment:  States had been on tramadol for the last 30 days and then ran out 2 days ago and can get it filled on monday Associated symptoms: abdominal pain, dehydration, nausea and vomiting   Associated symptoms comment:  Diarrhea  Risk factors: hx of DKA     Past Medical History  Diagnosis Date  . Diabetes mellitus without complication [facility])    Past Surgical History  Procedure Laterality Date  . Mouth surgery     Family History  Problem Relation Age of Onset  . Stroke Father   . CAD Other    Social History  Substance Use Topics  . Smoking status: Former Smoker    Types: Cigarettes  . Smokeless tobacco: Never Used  . Alcohol Use: Yes     Comment: social    Review of Systems  Gastrointestinal: Positive for nausea, vomiting and abdominal pain.  All other systems reviewed and are negative.     Allergies  Review of patient's allergies indicates no known allergies.  Home Medications   Prior to Admission medications   Medication Sig Start Date End Date Taking? Authorizing Provider  DULoxetine (CYMBALTA) 60 MG capsule Take 60 mg by mouth daily.   Yes Historical Provider, MD  insulin aspart (NOVOLOG) 100 UNIT/ML injection Inject 4 Units into the  skin 3 (three) times daily with meals. 05/02/15  Yes Albertine Grates, MD  insulin detemir (LEVEMIR) 100 UNIT/ML injection Inject 0.15 mLs (15 Units total) into the skin 2 (two) times daily. Patient taking differently: Inject 15-20 Units into the skin 2 (two) times daily.  05/02/15  Yes Albertine Grates, MD  metFORMIN (GLUCOPHAGE-XR) 500 MG 24 hr tablet Take 500 mg by mouth daily with breakfast.   Yes Historical Provider, MD  pregabalin (LYRICA) 25 MG capsule Take 1 capsule (25 mg total) by mouth 3 (three) times daily. 06/21/15  Yes Barrett Henle, PA-C  traMADol (ULTRAM) 50 MG tablet Take 50 mg by mouth 3 (three) times daily.    Yes Historical Provider, MD  ciprofloxacin (CIPRO) 500 MG tablet Take 1 tablet (500 mg total) by mouth 2 (two) times daily. Patient not taking: Reported on 08/13/2015 05/02/15   Albertine Grates, MD   BP 120/76 mmHg  Pulse 118  Resp 19  SpO2 100% Physical Exam  Constitutional: He is oriented to person, place, and time. He appears well-developed and well-nourished. No distress.  Smells of ketones  HENT:  Head: Normocephalic and atraumatic.  Mouth/Throat: Oropharynx is clear and moist. Mucous membranes are dry.  Eyes: Conjunctivae and EOM are normal. Pupils are equal, round, and reactive to light.  Neck: Normal range of motion. Neck supple.  Cardiovascular: Regular rhythm and intact distal pulses.  Tachycardia present.   No murmur heard. Pulmonary/Chest: Effort normal and breath sounds normal. No respiratory distress. He has no  wheezes. He has no rales.  Abdominal: Soft. He exhibits no distension. There is tenderness in the epigastric area. There is no rebound and no guarding.  Musculoskeletal: Normal range of motion. He exhibits no edema or tenderness.  Neurological: He is alert and oriented to person, place, and time.  Skin: Skin is warm and dry. No rash noted. No erythema.  Psychiatric: He has a normal mood and affect. His behavior is normal.  Nursing note and vitals reviewed.   ED  Course  Procedures (including critical care time) Labs Review Labs Reviewed  BASIC METABOLIC PANEL - Abnormal; Notable for the following:    CO2 19 (*)    Glucose, Bld 372 (*)    Anion gap 19 (*)    All other components within normal limits  CBC - Abnormal; Notable for the following:    MCHC 36.1 (*)    All other components within normal limits  URINALYSIS, ROUTINE W REFLEX MICROSCOPIC (NOT AT [facility]) - Abnormal; Notable for the following:    Specific Gravity, Urine 1.040 (*)    Glucose, UA >1000 (*)    Ketones, ur >80 (*)    All other components within normal limits  BLOOD GAS, VENOUS - Abnormal; Notable for the following:    pCO2, Ven 36.3 (*)    pO2, Ven 113.0 (*)    Bicarbonate 17.3 (*)    Acid-base deficit 8.0 (*)    All other components within normal limits  URINE MICROSCOPIC-ADD ON - Abnormal; Notable for the following:    Squamous Epithelial / LPF 0-5 (*)    All other components within normal limits  CBG MONITORING, ED - Abnormal; Notable for the following:    Glucose-Capillary 330 (*)    All other components within normal limits  LIPASE, BLOOD    Imaging Review No results found. I have personally reviewed and evaluated these images and lab results as part of my medical decision-making.   EKG Interpretation None      MDM   Final diagnoses:  Diabetic ketoacidosis without coma associated with type 1 diabetes mellitus (HCC)  Opiate withdrawal (HCC)    Patient is a 30 year old male with type 1 diabetes who presents today with nausea, vomiting, abdominal pain that started today. Patient's blood sugars been running high. He is unable to tolerate any fluids. Here patient has diffuse abdominal tenderness but no focal symptoms concerning for appendicitis, diverticulitis or cholecystitis. Patient's symptoms may be caused by narcotic withdrawal. He has been on tramadol for the last 30 days and ran out 2 days ago and has a prescription that he will be filling on Monday.  Patient is in DKA today with an anion gap of 19 with a compensated metabolic acidosis on VBG.   Rest of labs without significant findings. Patient started on IV fluids. Pain and nausea medication. Start on insulin drip.  Pt feelng much better after meds.  Will admit for DKA  CRITICAL CARE Performed by: Gwyneth SproutPLUNKETT,[place]an [place] Total critical care time: 30 minutes Critical care time was exclusive of separately billable procedures and treating other patients. Critical care was necessary to treat or prevent imminent or life-threatening deterioration. Critical care was time spent personally by me on the following activities: development of treatment plan with patient and/or surrogate as well as nursing, discussions with consultants, evaluation of patient's response to treatment, examination of patient, obtaining history from patient or surrogate, ordering and performing treatments and interventions, ordering and review of laboratory studies, ordering and review of radiographic studies, pulse oximetry  and re-evaluation of patient's condition.  Gwyneth Sprout, MD 08/14/15 1610  Gwyneth Sprout, MD 08/14/15 9604  Gwyneth Sprout, MD 08/14/15 5409

## 2015-08-13 NOTE — ED Notes (Signed)
Pt c/o n/v, abd pain, diarrhea, and hyperglycemia starting this AM. Pt Type 1 Diabetic. Pt [facility]. Pt A+OX4, rating abd pain 7/10.

## 2015-08-14 ENCOUNTER — Encounter (HOSPITAL_COMMUNITY): Payer: Self-pay | Admitting: Family Medicine

## 2015-08-14 ENCOUNTER — Observation Stay (HOSPITAL_COMMUNITY): Payer: Medicaid Other

## 2015-08-14 DIAGNOSIS — E1010 Type 1 diabetes mellitus with ketoacidosis without coma: Secondary | ICD-10-CM | POA: Diagnosis not present

## 2015-08-14 DIAGNOSIS — G629 Polyneuropathy, unspecified: Secondary | ICD-10-CM

## 2015-08-14 DIAGNOSIS — G63 Polyneuropathy in diseases classified elsewhere: Secondary | ICD-10-CM

## 2015-08-14 DIAGNOSIS — K529 Noninfective gastroenteritis and colitis, unspecified: Secondary | ICD-10-CM | POA: Diagnosis present

## 2015-08-14 DIAGNOSIS — R112 Nausea with vomiting, unspecified: Secondary | ICD-10-CM | POA: Diagnosis present

## 2015-08-14 DIAGNOSIS — Z794 Long term (current) use of insulin: Secondary | ICD-10-CM | POA: Diagnosis not present

## 2015-08-14 DIAGNOSIS — G609 Hereditary and idiopathic neuropathy, unspecified: Secondary | ICD-10-CM | POA: Diagnosis not present

## 2015-08-14 DIAGNOSIS — E1042 Type 1 diabetes mellitus with diabetic polyneuropathy: Secondary | ICD-10-CM | POA: Diagnosis not present

## 2015-08-14 DIAGNOSIS — F1193 Opioid use, unspecified with withdrawal: Secondary | ICD-10-CM | POA: Insufficient documentation

## 2015-08-14 DIAGNOSIS — F1123 Opioid dependence with withdrawal: Secondary | ICD-10-CM | POA: Diagnosis not present

## 2015-08-14 DIAGNOSIS — Z87891 Personal history of nicotine dependence: Secondary | ICD-10-CM | POA: Diagnosis not present

## 2015-08-14 DIAGNOSIS — R17 Unspecified jaundice: Secondary | ICD-10-CM | POA: Diagnosis present

## 2015-08-14 DIAGNOSIS — E1043 Type 1 diabetes mellitus with diabetic autonomic (poly)neuropathy: Secondary | ICD-10-CM | POA: Diagnosis present

## 2015-08-14 DIAGNOSIS — K3184 Gastroparesis: Secondary | ICD-10-CM | POA: Diagnosis not present

## 2015-08-14 DIAGNOSIS — E861 Hypovolemia: Secondary | ICD-10-CM | POA: Diagnosis present

## 2015-08-14 DIAGNOSIS — R1110 Vomiting, unspecified: Secondary | ICD-10-CM | POA: Diagnosis not present

## 2015-08-14 DIAGNOSIS — E109 Type 1 diabetes mellitus without complications: Secondary | ICD-10-CM | POA: Diagnosis present

## 2015-08-14 LAB — BASIC METABOLIC PANEL
Anion gap: 16 — ABNORMAL HIGH (ref 5–15)
Anion gap: 10 (ref 5–15)
BUN: 15 mg/dL (ref 6–20)
BUN: 13 mg/dL (ref 6–20)
CHLORIDE: 107 mmol/L (ref 101–111)
CHLORIDE: 110 mmol/L (ref 101–111)
CO2: 16 mmol/L — AB (ref 22–32)
CO2: 21 mmol/L — AB (ref 22–32)
CREATININE: 0.89 mg/dL (ref 0.61–1.24)
CREATININE: 0.65 mg/dL (ref 0.61–1.24)
Calcium: 9.2 mg/dL (ref 8.9–10.3)
Calcium: 9.2 mg/dL (ref 8.9–10.3)
GFR calc Af Amer: >60 mL/min (ref >60)
GFR calc Af Amer: >60 mL/min (ref >60)
GFR calc non Af Amer: >60 mL/min (ref >60)
GFR calc non Af Amer: >60 mL/min (ref >60)
GLUCOSE: 386 mg/dL — AB (ref 65–99)
Glucose, Bld: 152 mg/dL — ABNORMAL HIGH (ref 65–99)
Potassium: 4.7 mmol/L (ref 3.5–5.1)
Potassium: 4.1 mmol/L (ref 3.5–5.1)
Sodium: 139 mmol/L (ref 135–145)
Sodium: 141 mmol/L (ref 135–145)

## 2015-08-14 LAB — URINALYSIS, ROUTINE W REFLEX MICROSCOPIC
BILIRUBIN URINE: NEGATIVE
Glucose, UA: 500 mg/dL — AB
Hgb urine dipstick: NEGATIVE
Ketones, ur: >80 mg/dL — AB
Leukocytes, UA: NEGATIVE
NITRITE: NEGATIVE
PH: 5.5 (ref 5.0–8.0)
Protein, ur: NEGATIVE mg/dL
SPECIFIC GRAVITY, URINE: 1.03 (ref 1.005–1.030)

## 2015-08-14 LAB — RAPID URINE DRUG SCREEN, HOSP PERFORMED
AMPHETAMINES: NOT DETECTED
Barbiturates: NOT DETECTED
Benzodiazepines: NOT DETECTED
COCAINE: NOT DETECTED
OPIATES: NOT DETECTED
TETRAHYDROCANNABINOL: NOT DETECTED

## 2015-08-14 LAB — CBG MONITORING, ED
Glucose-Capillary: 339 mg/dL — ABNORMAL HIGH (ref 65–99)
Glucose-Capillary: 347 mg/dL — ABNORMAL HIGH (ref 65–99)

## 2015-08-14 LAB — GLUCOSE, CAPILLARY
GLUCOSE-CAPILLARY: 125 mg/dL — AB (ref 65–99)
GLUCOSE-CAPILLARY: 140 mg/dL — AB (ref 65–99)
GLUCOSE-CAPILLARY: 184 mg/dL — AB (ref 65–99)
GLUCOSE-CAPILLARY: 221 mg/dL — AB (ref 65–99)
GLUCOSE-CAPILLARY: 195 mg/dL — AB (ref 65–99)
Glucose-Capillary: 318 mg/dL — ABNORMAL HIGH (ref 65–99)
Glucose-Capillary: 251 mg/dL — ABNORMAL HIGH (ref 65–99)
Glucose-Capillary: 162 mg/dL — ABNORMAL HIGH (ref 65–99)
Glucose-Capillary: 133 mg/dL — ABNORMAL HIGH (ref 65–99)
Glucose-Capillary: 205 mg/dL — ABNORMAL HIGH (ref 65–99)
Glucose-Capillary: 207 mg/dL — ABNORMAL HIGH (ref 65–99)
Glucose-Capillary: 205 mg/dL — ABNORMAL HIGH (ref 65–99)

## 2015-08-14 LAB — MRSA PCR SCREENING: MRSA by PCR: NEGATIVE

## 2015-08-14 LAB — MAGNESIUM: MAGNESIUM: 1.8 mg/dL (ref 1.7–2.4)

## 2015-08-14 LAB — PHOSPHORUS: PHOSPHORUS: 2.9 mg/dL (ref 2.5–4.6)

## 2015-08-14 MED ORDER — PREGABALIN 25 MG PO CAPS
25.0000 mg | ORAL_CAPSULE | Freq: Three times a day (TID) | ORAL | Status: DC
  Administered 2015-08-14 – 2015-08-19 (×14): 25 mg via ORAL
  Filled 2015-08-14 (×14): qty 1

## 2015-08-14 MED ORDER — SODIUM CHLORIDE 0.9 % IV SOLN
INTRAVENOUS | Status: DC
  Administered 2015-08-14 – 2015-08-16 (×4): via INTRAVENOUS
  Filled 2015-08-14 (×9): qty 1000

## 2015-08-14 MED ORDER — SODIUM CHLORIDE 0.9 % IV BOLUS (SEPSIS): 1000.0000 mL | Freq: Once | INTRAVENOUS | Status: DC

## 2015-08-14 MED ORDER — INSULIN ASPART 100 UNIT/ML ~~LOC~~ SOLN: 0.0000 [IU] | Freq: Every day | SUBCUTANEOUS | Status: DC

## 2015-08-14 MED ORDER — PROCHLORPERAZINE EDISYLATE 5 MG/ML IJ SOLN
10.0000 mg | Freq: Four times a day (QID) | INTRAMUSCULAR | Status: DC | PRN
  Administered 2015-08-14 – 2015-08-18 (×6): 10 mg via INTRAVENOUS
  Filled 2015-08-14 (×6): qty 2

## 2015-08-14 MED ORDER — POTASSIUM CHLORIDE 10 MEQ/100ML IV SOLN
10.0000 meq | INTRAVENOUS | Status: AC
  Administered 2015-08-14 (×2): 10 meq via INTRAVENOUS
  Filled 2015-08-14 (×2): qty 100

## 2015-08-14 MED ORDER — HYDROMORPHONE HCL 1 MG/ML IJ SOLN
1.0000 mg | INTRAMUSCULAR | Status: DC | PRN
  Administered 2015-08-14: 1 mg via INTRAVENOUS
  Filled 2015-08-14: qty 1

## 2015-08-14 MED ORDER — TRAMADOL HCL 50 MG PO TABS
50.0000 mg | ORAL_TABLET | Freq: Three times a day (TID) | ORAL | Status: DC
Start: 2015-08-14 — End: 2015-08-19
  Administered 2015-08-16 – 2015-08-19 (×10): 50 mg via ORAL
  Filled 2015-08-14 (×11): qty 1

## 2015-08-14 MED ORDER — METOCLOPRAMIDE HCL 5 MG/ML IJ SOLN
5.0000 mg | Freq: Four times a day (QID) | INTRAMUSCULAR | Status: DC
  Administered 2015-08-14 – 2015-08-19 (×19): 5 mg via INTRAVENOUS
  Filled 2015-08-14: qty 1
  Filled 2015-08-14: qty 2
  Filled 2015-08-14 (×2): qty 1
  Filled 2015-08-14: qty 2
  Filled 2015-08-14: qty 1
  Filled 2015-08-14: qty 2
  Filled 2015-08-14: qty 1
  Filled 2015-08-14: qty 2
  Filled 2015-08-14: qty 1
  Filled 2015-08-14 (×2): qty 2
  Filled 2015-08-14: qty 1
  Filled 2015-08-14 (×3): qty 2
  Filled 2015-08-14 (×6): qty 1
  Filled 2015-08-14 (×2): qty 2
  Filled 2015-08-14 (×7): qty 1

## 2015-08-14 MED ORDER — INSULIN REGULAR HUMAN 100 UNIT/ML IJ SOLN
INTRAMUSCULAR | Status: DC
  Filled 2015-08-14: qty 2.5

## 2015-08-14 MED ORDER — INSULIN ASPART 100 UNIT/ML ~~LOC~~ SOLN
0.0000 [IU] | Freq: Three times a day (TID) | SUBCUTANEOUS | Status: DC
  Administered 2015-08-14: 2 [IU] via SUBCUTANEOUS

## 2015-08-14 MED ORDER — INSULIN ASPART 100 UNIT/ML ~~LOC~~ SOLN
0.0000 [IU] | SUBCUTANEOUS | Status: DC
  Administered 2015-08-14: 2 [IU] via SUBCUTANEOUS
  Administered 2015-08-14: 3 [IU] via SUBCUTANEOUS
  Administered 2015-08-15 (×2): 2 [IU] via SUBCUTANEOUS
  Administered 2015-08-15 (×2): 1 [IU] via SUBCUTANEOUS
  Administered 2015-08-15: 2 [IU] via SUBCUTANEOUS

## 2015-08-14 MED ORDER — SODIUM CHLORIDE 0.9 % IV SOLN
INTRAVENOUS | Status: DC
  Administered 2015-08-14: 2.8 [IU]/h via INTRAVENOUS
  Filled 2015-08-14: qty 2.5

## 2015-08-14 MED ORDER — DEXTROSE-NACL 5-0.45 % IV SOLN: INTRAVENOUS | Status: DC

## 2015-08-14 MED ORDER — PROCHLORPERAZINE EDISYLATE 5 MG/ML IJ SOLN
5.0000 mg | Freq: Once | INTRAMUSCULAR | Status: AC
  Administered 2015-08-14: 5 mg via INTRAVENOUS
  Filled 2015-08-14: qty 2

## 2015-08-14 MED ORDER — INSULIN DETEMIR 100 UNIT/ML ~~LOC~~ SOLN
15.0000 [IU] | Freq: Every day | SUBCUTANEOUS | Status: DC
  Administered 2015-08-14 – 2015-08-15 (×2): 15 [IU] via SUBCUTANEOUS
  Filled 2015-08-14 (×2): qty 0.15

## 2015-08-14 MED ORDER — METOCLOPRAMIDE HCL 5 MG/ML IJ SOLN
5.0000 mg | Freq: Three times a day (TID) | INTRAMUSCULAR | Status: DC
  Administered 2015-08-14: 5 mg via INTRAVENOUS
  Filled 2015-08-14: qty 2

## 2015-08-14 MED ORDER — CETYLPYRIDINIUM CHLORIDE 0.05 % MT LIQD
7.0000 mL | Freq: Two times a day (BID) | OROMUCOSAL | Status: DC
  Administered 2015-08-14 – 2015-08-19 (×10): 7 mL via OROMUCOSAL

## 2015-08-14 MED ORDER — DEXTROSE-NACL 5-0.45 % IV SOLN
INTRAVENOUS | Status: DC
  Administered 2015-08-14: 05:00:00 via INTRAVENOUS

## 2015-08-14 MED ORDER — SODIUM CHLORIDE 0.9 % IV SOLN
INTRAVENOUS | Status: DC
  Administered 2015-08-14: 03:00:00 via INTRAVENOUS

## 2015-08-14 MED ORDER — INSULIN ASPART 100 UNIT/ML ~~LOC~~ SOLN: 0.0000 [IU] | Freq: Three times a day (TID) | SUBCUTANEOUS | Status: DC

## 2015-08-14 MED ORDER — SODIUM CHLORIDE 0.9 % IV SOLN
INTRAVENOUS | Status: AC
  Administered 2015-08-14: 02:00:00 via INTRAVENOUS

## 2015-08-14 MED ORDER — DULOXETINE HCL 60 MG PO CPEP
60.0000 mg | ORAL_CAPSULE | Freq: Every day | ORAL | Status: DC
  Administered 2015-08-14 – 2015-08-19 (×6): 60 mg via ORAL
  Filled 2015-08-14 (×3): qty 1
  Filled 2015-08-14: qty 2
  Filled 2015-08-14 (×3): qty 1

## 2015-08-14 MED ORDER — ONDANSETRON HCL 4 MG/2ML IJ SOLN
4.0000 mg | Freq: Four times a day (QID) | INTRAMUSCULAR | Status: DC | PRN
  Administered 2015-08-14 – 2015-08-17 (×5): 4 mg via INTRAVENOUS
  Filled 2015-08-14 (×5): qty 2

## 2015-08-14 MED ORDER — ENOXAPARIN SODIUM 40 MG/0.4ML ~~LOC~~ SOLN
40.0000 mg | SUBCUTANEOUS | Status: DC
  Administered 2015-08-14 – 2015-08-18 (×5): 40 mg via SUBCUTANEOUS
  Filled 2015-08-14 (×7): qty 0.4

## 2015-08-14 NOTE — Progress Notes (Signed)
PROGRESS NOTE  Shawn BisonDario Maldonado NGE:[id number] DOB: [birth year] DOA: 08/13/2015 PCP: [facility] Brief History 30-year-old male with a history of diabetes mellitus type 1 and peripheral neuropathy presented with one-day history of nausea, vomiting, abdominal pain with elevated glucose. The patient states that he had run out of his Lyrica and tramadol for his neuropathy. He felt like he was experiencing some withdrawal with symptoms of vomiting and diarrhea. He states that he still took his insulin despite having the nausea and vomiting.  He endorses compliance with his insulin since his previous admission for DKA in December 2016. He denies any fevers, chills, recent URI type symptoms, dysuria, hematuria, headache, neck pain.Upon presentation, the patient was noted to have serum glucose of 372 with anion gap of 19 and ketonuria. The patient was started on intravenous insulin and fluid resuscitation. On 08/14/2015, the patient was transitioned to subcutaneous insulin Assessment/Plan: DKA, type 1 DM -recheck hemoglobin A1c -Question whether this was precipitated by the patient's opiate withdrawal type symptoms -08/14/2015--transition to subcutaneous insulin -Continue intravenous fluids as the patient remains clinically hypovolemic -Advance diet -05/01/2015 hemoglobin A1c 14.0  Peripheral neuropathy -continue Cymbalta, Lyrica and tramadol    Family Communication:   No family at beside Disposition Plan:   Home 4/16 if stable     Procedures/Studies:  No results found.      Subjective: Patient denies fevers, chills, headache, chest pain, dyspnea, nausea, vomiting, diarrhea, abdominal pain, dysuria, hematuria   Objective: Filed Vitals:   08/14/15 0300 08/14/15 0400 08/14/15 0500 08/14/15 0600  BP: 139/77 122/57 129/50 130/59  Pulse: 127 127 121 112  Temp:      TempSrc:      Resp: 25 24 23 15   Height:    5\' 5"  (1.651 m)  Weight:    49.4 kg (108 lb 14.5 oz)  SpO2:  98% 99% 98% 100%    Intake/Output Summary (Last 24 hours) at 08/14/15 0816 Last data filed at 08/14/15 0600  Gross per 24 hour  Intake 472.92 ml  Output    700 ml  Net -227.08 ml   Weight change:  Exam:   General:  Pt is alert, follows commands appropriately, not in acute distress  HEENT: No icterus, No thrush, No neck mass, [place]/AT  Cardiovascular: RRR, S1/S2, no rubs, no gallops  Respiratory: CTA bilaterally, no wheezing, no crackles, no rhonchi  Abdomen: Soft/+BS, non tender, non distended, no guarding  Extremities: No edema, No lymphangitis, No petechiae, No rashes, no synovitis  Data Reviewed: Basic Metabolic Panel:  Recent Labs Lab 08/13/15 2120 08/14/15 0151 08/14/15 0537  NA 141 139 141  K 4.3 4.7 4.1  CL 103 107 110  CO2 19* 16* 21*  GLUCOSE 372* 386* 152*  BUN 15 15 13   CREATININE 0.88 0.89 0.65  CALCIUM 10.0 9.2 9.2   Liver Function Tests: No results for input(s): AST, ALT, ALKPHOS, BILITOT, PROT, ALBUMIN in the last 168 hours.  Recent Labs Lab 08/13/15 2120  LIPASE 17   No results for input(s): AMMONIA in the last 168 hours. CBC:  Recent Labs Lab 08/13/15 2120  WBC 6.4  HGB 15.3  HCT 42.4  MCV 82.2  PLT 256   Cardiac Enzymes: No results for input(s): CKTOTAL, CKMB, CKMBINDEX, TROPONINI in the last 168 hours. BNP: Invalid input(s): POCBNP CBG:  Recent Labs Lab 08/14/15 0139 08/14/15 0250 08/14/15 0344 08/14/15 0647 08/14/15 0753  GLUCAP 339* 318* 251* 125* 140*    Recent  Results (from the past 240 hour(s))  MRSA PCR Screening     Status: None   Collection Time: 08/14/15  2:16 AM  Result Value Ref Range Status   MRSA by PCR NEGATIVE NEGATIVE Final    Comment:        The GeneXpert MRSA Assay (FDA approved for NASAL specimens only), is one component of a comprehensive MRSA colonization surveillance program. It is not intended to diagnose MRSA infection nor to guide or monitor treatment for MRSA infections.       Scheduled Meds: . antiseptic oral rinse  7 mL Mouth Rinse BID  . DULoxetine  60 mg Oral Daily  . enoxaparin (LOVENOX) injection  40 mg Subcutaneous Q24H  . insulin aspart  0-5 Units Subcutaneous QHS  . insulin aspart  0-9 Units Subcutaneous TID WC  . insulin detemir  15 Units Subcutaneous Daily  . pregabalin  25 mg Oral TID  . traMADol  50 mg Oral TID   Continuous Infusions: . sodium chloride 0.9 % 1,000 mL with potassium chloride 20 mEq infusion       Shawn Seppala, DO  [facility] Pager [phone]  If 7PM-7AM, please contact night-coverage [email or web address] Password [email or web address] 08/14/2015, 8:16 AM   LOS: 0 days

## 2015-08-14 NOTE — Progress Notes (Addendum)
Note referral.  Patient was recently discharged in January of 2017 due to DKA.  Note patient transitioning off insulin drip today.  No needs noted at this time.  Discussed with RN by phone. He states that patient did not skip insulin however they are wondering if this is from tramadol withdrawal?  He will need follow-up with PCP.  A1C pending. Placed order for outpatient diabetes education per protocol/co-sign required.   Thanks, Beryl MeagerJenny Kelso Bibby, RN, BC-ADM Inpatient Diabetes Coordinator Pager [phone] (8a-5p)

## 2015-08-14 NOTE — H&P (Signed)
[facility] History and Physical  Shawn Maldonado NFA:[id number] DOB: 05/07/1985 DOA: 08/13/2015  Referring physician: ED physician PCP: [facility]  Specialists: None listed  Chief Complaint:  Nausea, vomiting, abdominal pain, elevated blood sugar  HPI: Shawn Maldonado is a 30 y.o. male with PMH of type 1 diabetes mellitus with peripheral neuropathy who presents to the ED with 1 day of nausea, vomiting, abdominal pain, and elevated glucose levels. Patient was diagnosed with type 1 diabetes at the age of 30 and has been managed with Levemir, NovoLog, and metformin at home. He reports severe peripheral neuropathy for which she takes Lyrica and tramadol. He had been using tramadol 3 times daily but recently ran out and had been unable to fill his prescription for the past 2 days. Patient went to bed in his usual state last night and woke this morning with some mild abdominal pain and dry mouth. As the day progressed, the abdominal pain became more severe and he developed nausea with nonbloody vomiting. He describes his pain as diffuse, crampy, constant, and with no alleviating or exacerbating factors. He denies fevers or chills. Patient also denies chest pain, palpitations, or cough. There is been no rhinorrhea, sore throat, or sick contacts. He denies missing any doses of his medications other than tramadol.  In ED, patient was found to be afebrile, saturating well on room air, tachycardic to 1:30, but with vitals otherwise stable. Chemistry panels notable for a glucose of 372, serum bicarbonate 19, and anion gap of 19. CBC is unremarkable. Urine is obtained for analysis and reveals >1k glucose, >80 ketones, and elevated specific gravity. Patient was bolused with 2 L normal saline and started on insulin infusion. Patient reported some symptomatic improvement with the IV fluids and his heart rate began to come down. Patient will be admitted for ongoing evaluation and management of mild DKA  suspected secondary to narcotic withdrawal.  Where does patient live?   At home    Can patient participate in ADLs?  Yes        Review of Systems:   General: no fevers, chills, sweats, or weight change. Loss of appetite, fatigue HEENT: no blurry vision, hearing changes or sore throat Pulm: no dyspnea, cough, or wheeze CV: no chest pain or palpitations Abd: no constipation. N/V, diarrhea, generalized abdominal cramping GU: no dysuria, hematuria, increased urinary frequency, or urgency  Ext: no leg edema Neuro: no focal weakness, numbness, or tingling, no vision change or hearing loss Skin: no rash, no wounds MSK: No muscle spasm, no deformity, no red, hot, or swollen joint Heme: No easy bruising or bleeding Travel history: No recent long distant travel    Allergy: No Known Allergies  Past Medical History  Diagnosis Date  . Diabetes mellitus without complication [facility](HCC)     Past Surgical History  Procedure Laterality Date  . Mouth surgery      Social History:  reports that he has quit smoking. His smoking use included Cigarettes. He has never used smokeless tobacco. He reports that he drinks alcohol. He reports that he uses illicit drugs (Marijuana).  Family History:  Family History  Problem Relation Age of Onset  . Stroke Father   . CAD Other      Prior to Admission medications   Medication Sig Start Date End Date Taking? Authorizing Provider  DULoxetine (CYMBALTA) 60 MG capsule Take 60 mg by mouth daily.   Yes Historical Provider, MD  insulin aspart (NOVOLOG) 100 UNIT/ML injection Inject 4 Units into the skin  3 (three) times daily with meals. 05/02/15  Yes Albertine Grates, MD  insulin detemir (LEVEMIR) 100 UNIT/ML injection Inject 0.15 mLs (15 Units total) into the skin 2 (two) times daily. Patient taking differently: Inject 15-20 Units into the skin 2 (two) times daily.  05/02/15  Yes Albertine Grates, MD  metFORMIN (GLUCOPHAGE-XR) 500 MG 24 hr tablet Take 500 mg by mouth daily with  breakfast.   Yes Historical Provider, MD  pregabalin (LYRICA) 25 MG capsule Take 1 capsule (25 mg total) by mouth 3 (three) times daily. 06/21/15  Yes Barrett Henle, PA-C  traMADol (ULTRAM) 50 MG tablet Take 50 mg by mouth 3 (three) times daily.    Yes Historical Provider, MD  ciprofloxacin (CIPRO) 500 MG tablet Take 1 tablet (500 mg total) by mouth 2 (two) times daily. Patient not taking: Reported on 08/13/2015 05/02/15   Albertine Grates, MD    Physical Exam: Filed Vitals:   08/13/15 2057 08/13/15 2156 08/14/15 0037  BP: 127/79 120/76 138/79  Pulse: 130 118 122  Temp:   98.8 F (37.1 C)  TempSrc: Oral  Oral  Resp: SpO2: 100% 100% 100%   General: Not in acute distress, but obvious discomfort HEENT:       Eyes: PERRL, EOMI, no scleral icterus or conjunctival pallor.       ENT: No discharge from the ears or nose, no pharyngeal ulcers, dry oral mucosa.        Neck: No JVD, no bruit, no appreciable mass Heme: No cervical adenopathy, no pallor Cardiac: Rate ~120 and regular, hyperdynamic precordium, No murmurs, No gallops or rubs. Pulm: Good air movement bilaterally. No rales, wheezing, rhonchi or rubs. Abd: Soft, nondistended, nontender, no rebound pain or gaurding, BS present. Ext: No LE edema bilaterally. 2+DP/PT pulse bilaterally. Musculoskeletal: No gross deformity, no red, hot, swollen joints   Skin: No rashes or wounds on exposed surfaces  Neuro: Alert, oriented X3, cranial nerves II-XII grossly intact. No focal findings Psych: Patient is not overtly psychotic, appropriate mood and affect.  Labs on Admission:  Basic Metabolic Panel:  Recent Labs Lab 08/13/15 2120  NA 141  K 4.3  CL 103  CO2 19*  GLUCOSE 372*  BUN 15  CREATININE 0.88  CALCIUM 10.0   Liver Function Tests: No results for input(s): AST, ALT, ALKPHOS, BILITOT, PROT, ALBUMIN in the last 168 hours.  Recent Labs Lab 08/13/15 2120  LIPASE 17   No results for input(s): AMMONIA in the last 168  hours. CBC:  Recent Labs Lab 08/13/15 2120  WBC 6.4  HGB 15.3  HCT 42.4  MCV 82.2  PLT 256   Cardiac Enzymes: No results for input(s): CKTOTAL, CKMB, CKMBINDEX, TROPONINI in the last 168 hours.  BNP (last 3 results) No results for input(s): BNP in the last 8760 hours.  ProBNP (last 3 results) No results for input(s): PROBNP in the last 8760 hours.  CBG:  Recent Labs Lab 08/13/15 2057 08/14/15 0031  GLUCAP 330* 347*    Radiological Exams on Admission: No results found.  EKG:  Not done in ED, will obtain as appropriate   Assessment/Plan  1. Type I DM with mild DKA  - Presents with serum glucose 372, serum bicarb 19, AG 19, ketonuria  - Uncertain precipitant; pt denies missed doses; no evidence of serious bacterial infection or MI - Acute gastroenteritis is possible but suspect the GI sxs are an effect (rather than cause) of DKA - UDS requested  - Bolused  with 2 L NS in ED, continued on NS at 125 cc/hr  - Insulin infusion started, to be titrated per protocol  - NPO until gap closed and glucose <250; will transition to sq at that time  - Managed at home with Levemir 15-20 units BID and Novolog 4 units with meals  - A1c was 14% in December 2016 reflecting a lack of glycemic control  - Update A1c, pending  - Carb-modified diet when appropriate   2. Peripheral neuropathy  - Attributed to uncontrolled DM  - Pt reportedly disabled d/t this  - Managed with Lyrica and tramadol at home, will continue at current dose     DVT ppx:   SQ Lovenox    Code Status: Full code Family Communication:  Yes, patient's significant other at bed side Disposition Plan: Admit to inpatient   Date of Service 08/14/2015    Briscoe Deutscher, MD [facility] Pager [phone]  If 7PM-7AM, please contact night-coverage [email or web address] Password [email or web address] 08/14/2015, 1:38 AM

## 2015-08-15 DIAGNOSIS — E1010 Type 1 diabetes mellitus with ketoacidosis without coma: Secondary | ICD-10-CM | POA: Insufficient documentation

## 2015-08-15 DIAGNOSIS — R1110 Vomiting, unspecified: Secondary | ICD-10-CM | POA: Insufficient documentation

## 2015-08-15 LAB — GLUCOSE, CAPILLARY
GLUCOSE-CAPILLARY: 180 mg/dL — AB (ref 65–99)
GLUCOSE-CAPILLARY: 65 mg/dL (ref 65–99)
GLUCOSE-CAPILLARY: 68 mg/dL (ref 65–99)
GLUCOSE-CAPILLARY: 82 mg/dL (ref 65–99)
GLUCOSE-CAPILLARY: 95 mg/dL (ref 65–99)
Glucose-Capillary: 129 mg/dL — ABNORMAL HIGH (ref 65–99)
Glucose-Capillary: 143 mg/dL — ABNORMAL HIGH (ref 65–99)
Glucose-Capillary: 159 mg/dL — ABNORMAL HIGH (ref 65–99)
Glucose-Capillary: 183 mg/dL — ABNORMAL HIGH (ref 65–99)

## 2015-08-15 LAB — COMPREHENSIVE METABOLIC PANEL
ALBUMIN: 3.8 g/dL (ref 3.5–5.0)
ALK PHOS: 51 U/L (ref 38–126)
ALT: 22 U/L (ref 17–63)
ANION GAP: 10 (ref 5–15)
AST: 18 U/L (ref 15–41)
BILIRUBIN TOTAL: 1.7 mg/dL — AB (ref 0.3–1.2)
BUN: 10 mg/dL (ref 6–20)
CALCIUM: 8.9 mg/dL (ref 8.9–10.3)
CO2: 24 mmol/L (ref 22–32)
CREATININE: 0.57 mg/dL — AB (ref 0.61–1.24)
Chloride: 107 mmol/L (ref 101–111)
GFR calc Af Amer: >60 mL/min (ref >60)
GFR calc non Af Amer: >60 mL/min (ref >60)
GLUCOSE: 162 mg/dL — AB (ref 65–99)
Potassium: 3.7 mmol/L (ref 3.5–5.1)
Sodium: 141 mmol/L (ref 135–145)
TOTAL PROTEIN: 6.2 g/dL — AB (ref 6.5–8.1)

## 2015-08-15 MED ORDER — INSULIN DETEMIR 100 UNIT/ML ~~LOC~~ SOLN
5.0000 [IU] | Freq: Once | SUBCUTANEOUS | Status: AC
  Administered 2015-08-15: 5 [IU] via SUBCUTANEOUS
  Filled 2015-08-15: qty 0.05

## 2015-08-15 MED ORDER — INSULIN DETEMIR 100 UNIT/ML ~~LOC~~ SOLN
20.0000 [IU] | Freq: Every day | SUBCUTANEOUS | Status: DC
  Filled 2015-08-15: qty 0.2

## 2015-08-15 MED ORDER — PANTOPRAZOLE SODIUM 40 MG IV SOLR
40.0000 mg | Freq: Every day | INTRAVENOUS | Status: DC
  Administered 2015-08-15 – 2015-08-18 (×4): 40 mg via INTRAVENOUS
  Filled 2015-08-15 (×5): qty 40

## 2015-08-15 MED ORDER — BOOST / RESOURCE BREEZE PO LIQD
1.0000 | Freq: Three times a day (TID) | ORAL | Status: DC
  Administered 2015-08-15: 1 via ORAL

## 2015-08-15 NOTE — Progress Notes (Signed)
PROGRESS NOTE  Shawn BisonDario Genao WUJ:[id number] DOB: 02-02-1986 DOA: 08/13/2015 PCP: [facility]  Brief History 30-year-old male with a history of diabetes mellitus type 1 and peripheral neuropathy presented with one-day history of nausea, vomiting, abdominal pain with elevated glucose. The patient states that he had run out of his Lyrica and tramadol for his neuropathy. He felt like he was experiencing some withdrawal with symptoms of vomiting and diarrhea. He states that he still took his insulin despite having the nausea and vomiting. He endorses compliance with his insulin since his previous admission for DKA in December 2016. He denies any fevers, chills, recent URI type symptoms, dysuria, hematuria, headache, neck pain.Upon presentation, the patient was noted to have serum glucose of 372 with anion gap of 19 and ketonuria. The patient was started on intravenous insulin and fluid resuscitation. On 08/14/2015, the patient was transitioned to subcutaneous insulin Assessment/Plan: DKA, type 1 DM -recheck hemoglobin A1c--pending -Question whether this was precipitated by the patient's opiate withdrawal type symptoms -08/14/2015--transition to subcutaneous insulin -Continue intravenous fluids as the patient remains clinically hypovolemic -remain on clears -05/01/2015 hemoglobin A1c 14.0 -Increase Levemir to 20 units daily  Intractable N/V -likely gastroparesis vs gastritis -vomiting better but still nauseous -remain on clears today -added reglan 5 mg IV q 6 -add PPI -antiemetics -08/14/2015--AXR--nonspecific bowel gas pattern; no free air, no obstruction   Peripheral neuropathy -continue Cymbalta, Lyrica and tramadol     Procedures/Studies: Dg Abd 2 Views  08/14/2015  CLINICAL DATA:  Acute onset of intractable vomiting. Generalized abdominal pain. Initial encounter. EXAM: ABDOMEN - 2 VIEW COMPARISON:  None. FINDINGS: The visualized bowel gas pattern is unremarkable.  Scattered air and stool filled loops of colon are seen; no abnormal dilatation of small bowel loops is seen to suggest small bowel obstruction. No free intra-abdominal air is identified on the provided decubitus view. The visualized osseous structures are within normal limits; the sacroiliac joints are unremarkable in appearance. The visualized lung bases are essentially clear. IMPRESSION: Unremarkable bowel gas pattern; no free intra-abdominal air seen. Small amount of stool noted in the colon. Electronically Signed   By: Roanna RaiderJeffery  Chang M.D.   On: 08/14/2015 18:15         Subjective: Patient says the vomiting is improved but still nauseous. Denies any fevers, chills, Hispanic, shortness breath, abdominal pain, dysuria, hematuria. No rashes.  Objective: Filed Vitals:   08/14/15 2036 08/15/15 0135 08/15/15 0514 08/15/15 1043  BP: 124/62 125/73 122/56 129/66  Pulse: 121 123 93 102  Temp: 98.3 F (36.8 C) 98.7 F (37.1 C) 99 F (37.2 C) 98.3 F (36.8 C)  TempSrc: Oral Oral Oral Oral  Resp: 20 20 18 18   Height:      Weight:      SpO2: 99% 100% 99% 99%    Intake/Output Summary (Last 24 hours) at 08/15/15 1343 Last data filed at 08/15/15 1044  Gross per 24 hour  Intake   2150 ml  Output      0 ml  Net   2150 ml   Weight change:  Exam:   General:  Pt is alert, follows commands appropriately, not in acute distress  HEENT: No icterus, No thrush, No neck mass, /AT  Cardiovascular: RRR, S1/S2, no rubs, no gallops  Respiratory: CTA bilaterally, no wheezing, no crackles, no rhonchi  Abdomen: Soft/+BS, non tender, non distended, no guarding  Extremities: No edema, No lymphangitis, No petechiae, No rashes, no synovitis  Data  Reviewed: Basic Metabolic Panel:  Recent Labs Lab 08/13/15 2120 08/14/15 0151 08/14/15 0537 08/14/15 0605 08/15/15 0508  NA 141 139 141  --  141  K 4.3 4.7 4.1  --  3.7  CL 103 107 110  --  107  CO2 19* 16* 21*  --  24  GLUCOSE 372* 386* 152*   --  162*  BUN --  10  CREATININE 0.88 0.89 0.65  --  0.57*  CALCIUM 10.0 9.2 9.2  --  8.9  MG  --   --   --  1.8  --   PHOS  --   --   --  2.9  --    Liver Function Tests:  Recent Labs Lab 08/15/15 0508  AST 18  ALT 22  ALKPHOS 51  BILITOT 1.7*  PROT 6.2*  ALBUMIN 3.8    Recent Labs Lab 08/13/15 2120  LIPASE 17   No results for input(s): AMMONIA in the last 168 hours. CBC:  Recent Labs Lab 08/13/15 2120  WBC 6.4  HGB 15.3  HCT 42.4  MCV 82.2  PLT 256   Cardiac Enzymes: No results for input(s): CKTOTAL, CKMB, CKMBINDEX, TROPONINI in the last 168 hours. BNP: Invalid input(s): POCBNP CBG:  Recent Labs Lab 08/14/15 1931 08/15/15 0005 08/15/15 0358 08/15/15 0715 08/15/15 1234  GLUCAP 205* 143* 129* 180* 183*    Recent Results (from the past 240 hour(s))  MRSA PCR Screening     Status: None   Collection Time: 08/14/15  2:16 AM  Result Value Ref Range Status   MRSA by PCR NEGATIVE NEGATIVE Final    Comment:        The GeneXpert MRSA Assay (FDA approved for NASAL specimens only), is one component of a comprehensive MRSA colonization surveillance program. It is not intended to diagnose MRSA infection nor to guide or monitor treatment for MRSA infections.      Scheduled Meds: . antiseptic oral rinse  7 mL Mouth Rinse BID  . DULoxetine  60 mg Oral Daily  . enoxaparin (LOVENOX) injection  40 mg Subcutaneous Q24H  . feeding supplement  1 Container Oral TID BM  . insulin aspart  0-9 Units Subcutaneous Q4H  . insulin detemir  15 Units Subcutaneous Daily  . metoCLOPramide (REGLAN) injection  5 mg Intravenous 4 times per day  . pregabalin  25 mg Oral TID  . traMADol  50 mg Oral TID   Continuous Infusions: . sodium chloride 0.9 % 1,000 mL with potassium chloride 20 mEq infusion 100 mL/hr at 08/14/15 2024     Yazmin Locher, DO  [facility] Pager [phone]  If 7PM-7AM, please contact night-coverage [email or web address] Password  [email or web address] 08/15/2015, 1:43 PM   LOS: 1 day

## 2015-08-15 NOTE — Progress Notes (Signed)
Initial Nutrition Assessment  DOCUMENTATION CODES:   Severe malnutrition in context of chronic illness, Underweight  INTERVENTION:   -Provide Boost Breeze po TID, each supplement provides 250 kcal and 9 grams of protein -Once diet advanced past clears, provide Glucerna Shake po BID, each supplement provides 220 kcal and 10 grams of protein -Provided Glucerna shake coupons -Encouraged small frequent meals -RD to continue to monitor  NUTRITION DIAGNOSIS:   Malnutrition related to chronic illness as evidenced by percent weight loss, energy intake < or equal to 75% for > or equal to 1 month, moderate depletion of body fat, severe depletion of muscle mass.  GOAL:   Patient will meet greater than or equal to 90% of their needs  MONITOR:   PO intake, Supplement acceptance, Diet advancement, Labs, Weight trends, I & O's  REASON FOR ASSESSMENT:   Consult Assessment of nutrition requirement/status  ASSESSMENT:   30 year old male with a history of diabetes mellitus type 1 and peripheral neuropathy presented with one-day history of nausea, vomiting, abdominal pain with elevated glucose. The patient states that he had run out of his Lyrica and tramadol for his neuropathy. He felt like he was experiencing some withdrawal with symptoms of vomiting and diarrhea. He states that he still took his insulin despite having the nausea and vomiting. He endorses compliance with his insulin since his previous admission for DKA in December 2016.  Patient in room with no family at bedside. Pt reports his appetite has slightly improved today, he states he ate some chicken broth but nothing else. States he did not eat anything yesterday. PTA he was not eating much d/t vomiting/diarrhea. Pt drinks at least 2 Glucerna shakes a day. Currently on clear liquids, he is amenable to receiving Boost Breeze supplements until diet is advanced. RD recommend Glucerna Shakes once diet advanced to full liquids. Pt with no  diabetes diet education at this time. Encouraged pt to set up outpatient education. Glucerna Shake coupons were provided.  Pt reports UBW of 125 lb. He has lost 12 lb since 05/01/15 (10% wt loss x 3.5 months, significant for time frame). Nutrition-Focused physical exam completed. Findings are moderate fat depletion, severe muscle depletion, and no edema.   Medications: IV Reglan QID Labs reviewed: CBGs: 129-180 HgbA1c: 14.0 from December 2016  Diet Order:  Diet clear liquid Room service appropriate?: Yes; Fluid consistency:: Thin  Skin:  Reviewed, no issues  Last BM:  4/15  Height:   Ht Readings from Last 1 Encounters:  08/14/15 5\' 5"  (1.651 m)    Weight:   Wt Readings from Last 1 Encounters:  08/14/15 108 lb 14.5 oz (49.4 kg)    Ideal Body Weight:  61.8 kg  BMI:  Body mass index is 18.12 kg/(m^2).  Estimated Nutritional Needs:   Kcal:  1850-2050  Protein:  80-90g  Fluid:  2L/day  EDUCATION NEEDS:   Education needs addressed (Outpatient education referral placed per DM coordinator)  Tilda FrancoLindsey Macy Lingenfelter, MS, RD, LDN Pager: [phone] After Hours Pager: [phone]

## 2015-08-15 NOTE — Progress Notes (Addendum)
Hypoglycemic Event  CBG: 65  Treatment: 15 GM carbohydrate snack  Symptoms: None  Follow-up CBG: Time:2050 CBG Result:82  Possible Reasons for Event: Inadequate meal intake  Comments/MD notified:q4 cbg    Shawn BertholdLemons, [name]

## 2015-08-15 NOTE — Progress Notes (Signed)
Hypoglycemic Event  CBG: 68  Treatment: 15 GM carbohydrate snack  Symptoms: None  Follow-up CBG: Time:2035 CBG Result:65  Possible Reasons for Event: Inadequate meal intake  Comments/MD notified:recheck in 15 minutes    Shawn Maldonado, Shawn Maldonado

## 2015-08-16 LAB — BILIRUBIN, FRACTIONATED(TOT/DIR/INDIR)
BILIRUBIN TOTAL: 1.7 mg/dL — AB (ref 0.3–1.2)
Bilirubin, Direct: 0.2 mg/dL (ref 0.1–0.5)
Indirect Bilirubin: 1.5 mg/dL — ABNORMAL HIGH (ref 0.3–0.9)

## 2015-08-16 LAB — GLUCOSE, CAPILLARY
GLUCOSE-CAPILLARY: 115 mg/dL — AB (ref 65–99)
GLUCOSE-CAPILLARY: 177 mg/dL — AB (ref 65–99)
GLUCOSE-CAPILLARY: 157 mg/dL — AB (ref 65–99)
GLUCOSE-CAPILLARY: 104 mg/dL — AB (ref 65–99)
Glucose-Capillary: 217 mg/dL — ABNORMAL HIGH (ref 65–99)

## 2015-08-16 LAB — COMPREHENSIVE METABOLIC PANEL
ALBUMIN: 4.1 g/dL (ref 3.5–5.0)
ALT: 21 U/L (ref 17–63)
ANION GAP: 8 (ref 5–15)
AST: 15 U/L (ref 15–41)
Alkaline Phosphatase: 51 U/L (ref 38–126)
BILIRUBIN TOTAL: 1.8 mg/dL — AB (ref 0.3–1.2)
BUN: 6 mg/dL (ref 6–20)
CHLORIDE: 105 mmol/L (ref 101–111)
CO2: 26 mmol/L (ref 22–32)
Calcium: 9.3 mg/dL (ref 8.9–10.3)
Creatinine, Ser: 0.46 mg/dL — ABNORMAL LOW (ref 0.61–1.24)
GFR calc Af Amer: >60 mL/min (ref >60)
GFR calc non Af Amer: >60 mL/min (ref >60)
GLUCOSE: 132 mg/dL — AB (ref 65–99)
POTASSIUM: 3.6 mmol/L (ref 3.5–5.1)
Sodium: 139 mmol/L (ref 135–145)
TOTAL PROTEIN: 6.5 g/dL (ref 6.5–8.1)

## 2015-08-16 LAB — URINE CULTURE: Culture: 60000 — AB

## 2015-08-16 LAB — HEMOGLOBIN A1C
HEMOGLOBIN A1C: 9.3 % — AB (ref 4.8–5.6)
MEAN PLASMA GLUCOSE: 220 mg/dL

## 2015-08-16 MED ORDER — POTASSIUM CHLORIDE IN NACL 20-0.9 MEQ/L-% IV SOLN
INTRAVENOUS | Status: DC
  Administered 2015-08-16 – 2015-08-18 (×4): via INTRAVENOUS
  Filled 2015-08-16 (×8): qty 1000

## 2015-08-16 MED ORDER — INSULIN DETEMIR 100 UNIT/ML ~~LOC~~ SOLN
20.0000 [IU] | Freq: Every day | SUBCUTANEOUS | Status: DC
  Administered 2015-08-16 – 2015-08-17 (×2): 20 [IU] via SUBCUTANEOUS
  Filled 2015-08-16 (×4): qty 0.2

## 2015-08-16 MED ORDER — GLUCERNA SHAKE PO LIQD
237.0000 mL | Freq: Two times a day (BID) | ORAL | Status: DC
  Administered 2015-08-16 – 2015-08-19 (×2): 237 mL via ORAL
  Filled 2015-08-16 (×8): qty 237

## 2015-08-16 MED ORDER — INSULIN ASPART 100 UNIT/ML ~~LOC~~ SOLN
0.0000 [IU] | Freq: Three times a day (TID) | SUBCUTANEOUS | Status: DC
  Administered 2015-08-16: 2 [IU] via SUBCUTANEOUS
  Administered 2015-08-16: 3 [IU] via SUBCUTANEOUS
  Administered 2015-08-16: 2 [IU] via SUBCUTANEOUS
  Administered 2015-08-17: 1 [IU] via SUBCUTANEOUS
  Administered 2015-08-18: 3 [IU] via SUBCUTANEOUS
  Administered 2015-08-18: 2 [IU] via SUBCUTANEOUS
  Administered 2015-08-19: 3 [IU] via SUBCUTANEOUS

## 2015-08-16 MED ORDER — INSULIN DETEMIR 100 UNIT/ML ~~LOC~~ SOLN
18.0000 [IU] | Freq: Every day | SUBCUTANEOUS | Status: DC
  Filled 2015-08-16: qty 0.18

## 2015-08-16 MED ORDER — INSULIN ASPART 100 UNIT/ML ~~LOC~~ SOLN: 0.0000 [IU] | Freq: Every day | SUBCUTANEOUS | Status: DC

## 2015-08-16 NOTE — Progress Notes (Addendum)
PROGRESS NOTE  Shawn Maldonado:[id number] DOB: [birth year] DOA: 08/13/2015 PCP: [facility]  Brief History 30-year-old male with a history of diabetes mellitus type 1 and peripheral neuropathy presented with one-day history of nausea, vomiting, abdominal pain with elevated glucose. The patient states that he had run out of his Lyrica and tramadol for his neuropathy. He felt like he was experiencing some withdrawal with symptoms of vomiting and diarrhea. He states that he still took his insulin despite having the nausea and vomiting. He endorses compliance with his insulin since his previous admission for DKA in December 2016. He denies any fevers, chills, recent URI type symptoms, dysuria, hematuria, headache, neck pain.Upon presentation, the patient was noted to have serum glucose of 372 with anion gap of 19 and ketonuria. The patient was started on intravenous insulin and fluid resuscitation. On 08/14/2015, the patient was transitioned to subcutaneous insulin Assessment/Plan: DKA, type 1 DM -recheck hemoglobin A1c--pending -Question whether this was precipitated by the patient's opiate withdrawal type symptoms -08/14/2015--transition to subcutaneous insulin -Continue intravenous fluids as the patient remains clinically hypovolemic -remain on clears -05/01/2015 hemoglobin A1c 14.0 -remain on Levemir to 20 units daily;  Change CBG checks to ac/hs  Intractable N/V -likely gastroparesis vs gastritis -vomiting better but still poor po intake -advance to full liquids -continue reglan 5 mg IV q 6 -continue PPI -antiemetics prn -08/14/2015--AXR--nonspecific bowel gas pattern; no free air, no obstruction   Peripheral neuropathy -continue Cymbalta, Lyrica and tramadol  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 Generalized abdominal pain. Initial encounter. EXAM: ABDOMEN - 2 VIEW COMPARISON:  None. FINDINGS: The visualized bowel gas pattern is unremarkable. Scattered air and stool filled loops of colon are seen; no abnormal dilatation of small bowel loops is seen to suggest small bowel obstruction. No free intra-abdominal air is identified on the provided decubitus view. The visualized osseous structures are within normal limits; the sacroiliac joints are unremarkable in appearance. The visualized lung bases are essentially clear. IMPRESSION: Unremarkable bowel gas pattern; no free intra-abdominal air seen. Small amount of stool noted in the colon. Electronically Signed   By: Roanna Raider M.D.   On: 08/14/2015 18:15         Subjective: Patient denies fevers, chills, headache, chest pain, dyspnea,vomiting, diarrhea, abdominal pain, dysuria, hematuria.  Still having intermitten waves of nausea.  +BM   Objective: Filed Vitals:   08/15/15 1812 08/15/15 2100 08/16/15 0202 08/16/15 0516  BP: 129/84 133/80 129/84 130/77  Pulse: 108 102 93 95  Temp: 98.4 F (36.9 C) 98.1 F (36.7 C) 98.9 F (37.2 C) 98.2 F (36.8 C)  TempSrc: Oral Oral Oral Oral  Resp: Height:      Weight:      SpO2: 98% 98% 100% 100%    Intake/Output Summary (Last 24 hours) at 08/16/15 0731 Last data filed at 08/16/15 0600  Gross per 24 hour  Intake   3240 ml  Output    850 ml  Net   2390 ml   Weight change:  Exam:   General:  Pt is alert, follows commands appropriately, not in acute distress  HEENT: No icterus, No thrush, No neck mass, [place]/AT  Cardiovascular: RRR, S1/S2, no rubs, no gallops  Respiratory:  CTA bilaterally, no wheezing, no crackles, no rhonchi  Abdomen: Soft/+BS, non tender, non distended, no guarding; no hepatosplenomegaly  Extremities: No edema, No lymphangitis, No  petechiae, No rashes, no synovitis  Data Reviewed: Basic Metabolic Panel:  Recent Labs Lab 08/13/15 2120 08/14/15 0151 08/14/15 0537 08/14/15 0605 08/15/15 0508 08/16/15 0418  NA 141 139 141  --  141 139  K 4.3 4.7 4.1  --  3.7 3.6  CL 103 107 110  --  107 105  CO2 19* 16* 21*  --  24 26  GLUCOSE 372* 386* 152*  --  162* 132*  BUN 15 15 13   --  10 6  CREATININE 0.88 0.89 0.65  --  0.57* 0.46*  CALCIUM 10.0 9.2 9.2  --  8.9 9.3  MG  --   --   --  1.8  --   --   PHOS  --   --   --  2.9  --   --    Liver Function Tests:  Recent Labs Lab 08/15/15 0508 08/16/15 0418  AST 18 15  ALT 22 21  ALKPHOS 51 51  BILITOT 1.7* 1.8*  PROT 6.2* 6.5  ALBUMIN 3.8 4.1    Recent Labs Lab 08/13/15 2120  LIPASE 17   No results for input(s): AMMONIA in the last 168 hours. CBC:  Recent Labs Lab 08/13/15 2120  WBC 6.4  HGB 15.3  HCT 42.4  MCV 82.2  PLT 256   Cardiac Enzymes: No results for input(s): CKTOTAL, CKMB, CKMBINDEX, TROPONINI in the last 168 hours. BNP: Invalid input(s): POCBNP CBG:  Recent Labs Lab 08/15/15 2018 08/15/15 2039 08/15/15 2100 08/15/15 2334 08/16/15 0418  GLUCAP 68 65 82 95 115*    Recent Results (from the past 240 hour(s))  MRSA PCR Screening     Status: None   Collection Time: 08/14/15  2:16 AM  Result Value Ref Range Status   MRSA by PCR NEGATIVE NEGATIVE Final    Comment:        The GeneXpert MRSA Assay (FDA approved for NASAL specimens only), is one component of a comprehensive MRSA colonization surveillance program. It is not intended to diagnose MRSA infection nor to guide or monitor treatment for MRSA infections.   Urine culture     Status: None (Preliminary result)   Collection Time: 08/14/15 12:40 PM  Result Value Ref Range Status   Specimen Description URINE, CLEAN CATCH  Final   Special Requests NONE  Final   Culture   Final    TOO YOUNG TO READ Performed at [facility]    Report Status PENDING  Incomplete      Scheduled Meds: . antiseptic oral rinse  7 mL Mouth Rinse BID  . DULoxetine  60 mg Oral Daily  . enoxaparin (LOVENOX) injection  40 mg Subcutaneous Q24H  . feeding supplement  1 Container Oral TID BM  . insulin aspart  0-5 Units Subcutaneous QHS  . insulin aspart  0-9 Units Subcutaneous TID WC  . insulin detemir  18 Units Subcutaneous Daily  . metoCLOPramide (REGLAN) injection  5 mg Intravenous 4 times per day  . pantoprazole (PROTONIX) IV  40 mg Intravenous Daily  . pregabalin  25 mg Oral TID  . traMADol  50 mg Oral TID   Continuous Infusions: . sodium chloride 0.9 % 1,000 mL with potassium chloride 20 mEq infusion 100 mL/hr at 08/16/15 0450     Tahtiana Rozier, DO  [facility] Pager [phone]  If  7PM-7AM, please contact night-coverage [email or web address] Password [email or web address] 08/16/2015, 7:31 AM   LOS: 2 days

## 2015-08-17 LAB — CBC
HEMATOCRIT: 36.3 % — AB (ref 39.0–52.0)
Hemoglobin: 12.8 g/dL — ABNORMAL LOW (ref 13.0–17.0)
MCH: 28.8 pg (ref 26.0–34.0)
MCHC: 35.3 g/dL (ref 30.0–36.0)
MCV: 81.8 fL (ref 78.0–100.0)
Platelets: 215 10*3/uL (ref 150–400)
RBC: 4.44 MIL/uL (ref 4.22–5.81)
RDW: 11.8 % (ref 11.5–15.5)
WBC: 8.2 10*3/uL (ref 4.0–10.5)

## 2015-08-17 LAB — COMPREHENSIVE METABOLIC PANEL
ALK PHOS: 52 U/L (ref 38–126)
ALT: 19 U/L (ref 17–63)
AST: 13 U/L — AB (ref 15–41)
Albumin: 4.2 g/dL (ref 3.5–5.0)
Anion gap: 9 (ref 5–15)
BUN: 7 mg/dL (ref 6–20)
CALCIUM: 9.2 mg/dL (ref 8.9–10.3)
CHLORIDE: 105 mmol/L (ref 101–111)
CO2: 24 mmol/L (ref 22–32)
CREATININE: 0.49 mg/dL — AB (ref 0.61–1.24)
Glucose, Bld: 69 mg/dL (ref 65–99)
Potassium: 3.6 mmol/L (ref 3.5–5.1)
Sodium: 138 mmol/L (ref 135–145)
Total Bilirubin: 2.2 mg/dL — ABNORMAL HIGH (ref 0.3–1.2)
Total Protein: 6.4 g/dL — ABNORMAL LOW (ref 6.5–8.1)

## 2015-08-17 LAB — GLUCOSE, CAPILLARY
GLUCOSE-CAPILLARY: 105 mg/dL — AB (ref 65–99)
GLUCOSE-CAPILLARY: 122 mg/dL — AB (ref 65–99)
GLUCOSE-CAPILLARY: 67 mg/dL (ref 65–99)
Glucose-Capillary: 84 mg/dL (ref 65–99)
Glucose-Capillary: 61 mg/dL — ABNORMAL LOW (ref 65–99)
Glucose-Capillary: 73 mg/dL (ref 65–99)

## 2015-08-17 NOTE — Progress Notes (Signed)
Hypoglycemic Event  CBG: 61  Treatment: juice  Symptoms: none  Follow-up CBG: Time:2128 CBG Result:73  Possible Reasons for Event: decrease po intake  Comments/MD notified:no    Para MarchJones, Shyanne Mcclary Rebecca

## 2015-08-17 NOTE — Progress Notes (Signed)
PROGRESS NOTE  Shawn Maldonado ZOX:[id number] DOB: [birth year] DOA: 08/13/2015 PCP: [facility]  Brief History 30-year-old male with a history of diabetes mellitus type 1 and peripheral neuropathy presented with one-day history of nausea, vomiting, abdominal pain with elevated glucose. The patient states that he had run out of his Lyrica and tramadol for his neuropathy. He felt like he was experiencing some withdrawal with symptoms of vomiting and diarrhea. He states that he still took his insulin despite having the nausea and vomiting. He endorses compliance with his insulin since his previous admission for DKA in December 2016. He denies any fevers, chills, recent URI type symptoms, dysuria, hematuria, headache, neck pain.Upon presentation, the patient was noted to have serum glucose of 372 with anion gap of 19 and ketonuria. The patient was started on intravenous insulin and fluid resuscitation. On 08/14/2015, the patient was transitioned to subcutaneous insulin unfortunately, the patient had difficulty tolerating his diet on 2 separate occasions with diet advancement.   case was discussed with GI, Dr. Matthias HughsBuccini. Assessment/Plan: DKA, type 1 DM, uncontrolled -08/14/2015 HbA1c--9.3 -Question whether this was precipitated by the patient's opiate withdrawal type symptoms -08/14/2015--transition to subcutaneous insulin -Continue intravenous fluids as the patient remains clinically hypovolemic -remain on clears -05/01/2015 hemoglobin A1c 14.0 -decrease Levemir to 16 units daily; Change CBG checks to ac/hs  Intractable N/V -likely gastroparesis vs gastritis -continue reglan 5 mg IV q 6 -continue PPI -antiemetics prn -08/14/2015--AXR--nonspecific bowel gas pattern; no free air, no obstruction  -08/17/2015--patient had emesis with diet advancement (second time) -08/18/2015--discussed with GI, Dr. 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 Generalized abdominal pain. Initial encounter. EXAM: ABDOMEN - 2 VIEW COMPARISON:  None. FINDINGS: The visualized bowel gas pattern is unremarkable. Scattered air and stool filled loops of colon are seen; no abnormal dilatation of small bowel loops is seen to suggest small bowel obstruction. No free intra-abdominal air is identified on the provided decubitus view. The visualized osseous structures are within normal limits; the sacroiliac joints are unremarkable in appearance. The visualized lung bases are essentially clear. IMPRESSION: Unremarkable bowel gas pattern; no free intra-abdominal air seen. Small amount of stool noted in the colon. Electronically Signed   By: Roanna RaiderJeffery  Chang M.D.   On: 08/14/2015 18:15         Subjective: Patient had nausea and vomiting with advancing diet to car modified. Denies any chest pain, shortness breath, abdominal pain, headache, fevers, chills, neck pain, dysuria, hematuria. No rashes.   Objective: Filed Vitals:   08/17/15 0459 08/17/15 0936 08/17/15 1432 08/17/15 1500  BP: 138/69 129/89 136/91 131/85  Pulse: 84 86 116 99  Temp: 98 F (36.7 C) 97.6 F (36.4 C) 98.1 F (36.7 C) 98.2 F (36.8 C)  TempSrc: Oral Oral Oral Oral  Resp: 14 16 18 18   Height:      Weight:      SpO2: 98% 99% 98% 99%    Intake/Output  Summary (Last 24 hours) at 08/17/15 1802 Last data filed at  08/17/15 1432  Gross per 24 hour  Intake   1200 ml  Output   1525 ml  Net   -325 ml   Weight change:  Exam:   General:  Pt is alert, follows commands appropriately, not in acute distress  HEENT: No icterus, No thrush, No neck mass, [place]/AT  Cardiovascular: RRR, S1/S2, no rubs, no gallops  Respiratory: CTA bilaterally, no wheezing, no crackles, no rhonchi  Abdomen: Soft/+BS, non tender, non distended, no guarding; no hepatosplenomegaly   Extremities: No edema, No lymphangitis, No petechiae, No rashes, no synovitis  Data Reviewed: Basic Metabolic Panel:  Recent Labs Lab 08/14/15 0151 08/14/15 0537 08/14/15 0605 08/15/15 0508 08/16/15 0418 08/17/15 0429  NA 139 141  --  141 139 138  K 4.7 4.1  --  3.7 3.6 3.6  CL 107 110  --  107 105 105  CO2 16* 21*  --  GLUCOSE 386* 152*  --  162* 132* 69  BUN 15 13  --  CREATININE 0.89 0.65  --  0.57* 0.46* 0.49*  CALCIUM 9.2 9.2  --  8.9 9.3 9.2  MG  --   --  1.8  --   --   --   PHOS  --   --  2.9  --   --   --    Liver Function Tests:  Recent Labs Lab 08/15/15 0508 08/16/15 0418 08/17/15 0429  AST 18 15 13*  ALT ALKPHOS 51 51 52  BILITOT 1.7* 1.7*  1.8* 2.2*  PROT 6.2* 6.5 6.4*  ALBUMIN 3.8 4.1 4.2    Recent Labs Lab 08/13/15 2120  LIPASE 17   No results for input(s): AMMONIA in the last 168 hours. CBC:  Recent Labs Lab 08/13/15 2120 08/17/15 0429  WBC 6.4 8.2  HGB 15.3 12.8*  HCT 42.4 36.3*  MCV 82.2 81.8  PLT 256 215   Cardiac Enzymes: No results for input(s): CKTOTAL, CKMB, CKMBINDEX, TROPONINI in the last 168 hours. BNP: Invalid input(s): POCBNP CBG:  Recent Labs Lab 08/16/15 1731 08/16/15 2107 08/17/15 0722 08/17/15 1145 08/17/15 1710  GLUCAP 157* 104* 84 122* 67    Recent Results (from the past 240 hour(s))  MRSA PCR Screening     Status: None   Collection Time: 08/14/15  2:16 AM  Result Value Ref Range Status   MRSA by PCR NEGATIVE NEGATIVE Final     Comment:        The GeneXpert MRSA Assay (FDA approved for NASAL specimens only), is one component of a comprehensive MRSA colonization surveillance program. It is not intended to diagnose MRSA infection nor to guide or monitor treatment for MRSA infections.   Urine culture     Status: Abnormal   Collection Time: 08/14/15 12:40 PM  Result Value Ref Range Status   Specimen Description URINE, CLEAN CATCH  Final   Special Requests NONE  Final   Culture (A)  Final    60,000 COLONIES/mL DIPHTHEROIDS(CORYNEBACTERIUM SPECIES) Standardized susceptibility testing for this organism is not available. Performed at [facility]    Report Status 08/16/2015 FINAL  Final     Scheduled Meds: . antiseptic oral rinse  7 mL Mouth Rinse BID  . DULoxetine  60 mg Oral Daily  . enoxaparin (LOVENOX) injection  40 mg Subcutaneous Q24H  . feeding supplement (GLUCERNA SHAKE)  237 mL Oral BID  BM  . insulin aspart  0-5 Units Subcutaneous QHS  . insulin aspart  0-9 Units Subcutaneous TID WC  . insulin detemir  20 Units Subcutaneous Daily  . metoCLOPramide (REGLAN) injection  5 mg Intravenous 4 times per day  . pantoprazole (PROTONIX) IV  40 mg Intravenous Daily  . pregabalin  25 mg Oral TID  . traMADol  50 mg Oral TID   Continuous Infusions: . 0.9 % NaCl with KCl 20 mEq / L 100 mL/hr at 08/17/15 0944     Dylan Monforte, DO  [facility] Pager [phone]  If 7PM-7AM, please contact night-coverage [email or web address] Password [email or web address] 08/17/2015, 6:02 PM   LOS: 3 days

## 2015-08-18 DIAGNOSIS — G609 Hereditary and idiopathic neuropathy, unspecified: Secondary | ICD-10-CM

## 2015-08-18 DIAGNOSIS — R1110 Vomiting, unspecified: Secondary | ICD-10-CM

## 2015-08-18 LAB — COMPREHENSIVE METABOLIC PANEL
ALK PHOS: 48 U/L (ref 38–126)
ALT: 16 U/L — AB (ref 17–63)
ANION GAP: 9 (ref 5–15)
AST: 12 U/L — ABNORMAL LOW (ref 15–41)
Albumin: 4.1 g/dL (ref 3.5–5.0)
BILIRUBIN TOTAL: 2.3 mg/dL — AB (ref 0.3–1.2)
BUN: 6 mg/dL (ref 6–20)
CALCIUM: 8.9 mg/dL (ref 8.9–10.3)
CO2: 24 mmol/L (ref 22–32)
CREATININE: 0.47 mg/dL — AB (ref 0.61–1.24)
Chloride: 102 mmol/L (ref 101–111)
GFR calc Af Amer: >60 mL/min (ref >60)
Glucose, Bld: 77 mg/dL (ref 65–99)
Potassium: 3.6 mmol/L (ref 3.5–5.1)
Sodium: 135 mmol/L (ref 135–145)
TOTAL PROTEIN: 6.7 g/dL (ref 6.5–8.1)

## 2015-08-18 LAB — CBC
HCT: 40 % (ref 39.0–52.0)
Hemoglobin: 14.2 g/dL (ref 13.0–17.0)
MCH: 29.6 pg (ref 26.0–34.0)
MCHC: 35.5 g/dL (ref 30.0–36.0)
MCV: 83.3 fL (ref 78.0–100.0)
PLATELETS: 245 10*3/uL (ref 150–400)
RBC: 4.8 MIL/uL (ref 4.22–5.81)
RDW: 11.9 % (ref 11.5–15.5)
WBC: 7 10*3/uL (ref 4.0–10.5)

## 2015-08-18 LAB — GLUCOSE, CAPILLARY
GLUCOSE-CAPILLARY: 108 mg/dL — AB (ref 65–99)
GLUCOSE-CAPILLARY: 175 mg/dL — AB (ref 65–99)
GLUCOSE-CAPILLARY: 183 mg/dL — AB (ref 65–99)
Glucose-Capillary: 230 mg/dL — ABNORMAL HIGH (ref 65–99)

## 2015-08-18 MED ORDER — INSULIN DETEMIR 100 UNIT/ML ~~LOC~~ SOLN
18.0000 [IU] | Freq: Every day | SUBCUTANEOUS | Status: DC
  Administered 2015-08-19: 18 [IU] via SUBCUTANEOUS
  Filled 2015-08-18: qty 0.18

## 2015-08-18 NOTE — Progress Notes (Signed)
Inpatient Diabetes Program Recommendations  AACE/ADA: New Consensus Statement on Inpatient Glycemic Control (2015)  Target Ranges:  Prepandial:   less than 140 mg/dL      Peak postprandial:   less than 180 mg/dL (1-2 hours)      Critically ill patients:  140 - 180 mg/dL   Review of Glycemic Control  Results for Shawn Maldonado, Ross (MRN [id number]) as of 08/18/2015 10:30  Ref. Range 08/17/2015 07:22 08/17/2015 11:45 08/17/2015 17:10 08/17/2015 21:01 08/17/2015 21:28 08/17/2015 23:30 08/18/2015 07:45  Glucose-Capillary Latest Ref Range: 65-99 mg/dL 84 409122 (H) 67 61 (L) 73 105 (H) 108 (H)  Hypoglycemia - mild x 2 days. PO intake poor  Inpatient Diabetes Program Recommendations:    Decrease Levemir to 18 units QD.  Continue to follow. Thank you. Ailene Ardshonda Opie Fanton, RD, LDN, CDE Inpatient Diabetes Coordinator [phone]

## 2015-08-18 NOTE — Progress Notes (Addendum)
PROGRESS NOTE    Shawn Maldonado  YNW:[id number] DOB: [birth year] DOA: 08/13/2015 PCP: [facility]    Brief Narrative: 30 year old male with a history of diabetes mellitus type 1 and peripheral neuropathy presented with one-day history of nausea, vomiting, abdominal pain with elevated glucose. The patient states that he had run out of his Lyrica and tramadol for his neuropathy. He felt like he was experiencing some withdrawal with symptoms of vomiting and diarrhea. He states that he still took his insulin despite having the nausea and vomiting. He endorses compliance with his insulin since his previous admission for DKA in December 2016. He denies any fevers, chills, recent URI type symptoms, dysuria, hematuria, headache, neck pain.Upon presentation, the patient was noted to have serum glucose of 372 with anion gap of 19 and ketonuria. The patient was started on intravenous insulin and fluid resuscitation. On 08/14/2015, the patient was transitioned to subcutaneous insulin unfortunately, the patient had difficulty tolerating his diet on 2 separate occasions with diet advancement. Case was discussed with GI, Dr. Matthias Hughs and a 72 hour plan for advancement of diet was initiated.     Assessment & Plan:   DKA (diabetic ketoacidoses) in the setting of Type I diabetes mellitus with Peripheral neuropathy  - 08/14/2015 HbA1c was 9.3 - I am not sure if tramadol withdrawal would cause this level of nausea and vomiting, but it should be considered. He is back on his tramadol now.  - 08/14/2015--transition to subcutaneous insulin - Continue intravenous fluids at NS at 100cc/hr, he is volume down and also with decreased PO intake - remain on clears, see plan below.  -  Reviewed Diabetes Coordinator note, recommends decrease Levemir to 18 units daily - Continue home lyrica and duloxetine    Intractable vomiting and nausea - No vomiting in 24 hours - Continue phenergan and Zofran PRN for nausea -  Plan discussed with Dr. Arbutus Leas and Dr. Matthias Hughs (GI) includes advancement to 2 oz of clears or water q 30 min while awake for 24 hours--> try full liquids for24 hours --> if still intolerant, formal GI consult.  Today, reinforced 2 oz of clears or water every 30 min.  He does seem to be improving.  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  - no abdominal pain, continue to trend   DVT prophylaxis: Lovenox Code Status: Full Disposition Plan: Pending advancement of diet, likely discharge in 2-3 days   Consultants:   GI - phone only for now, recs above  Procedures:   08/14/15  Abdomen xray IMPRESSION: Unremarkable bowel gas pattern; no free intra-abdominal air seen. Small amount of stool noted in the colon.  Antimicrobials:   None    Subjective: Doing okay this morning.  He reports no vomiting yesterday and a couple of episodes of nausea.  Last dose of Zofran yesterday at 0800.  Last dose of phenergan this morning.  No further dilaudid for multiple days.  He understands the plan for slow going with liquids and we discussed the nature of gastroparesis this morning.   Objective: Filed Vitals:   08/17/15 1500 08/17/15 2058 08/18/15 0150 08/18/15 0452  BP: 131/85 135/85 126/74 124/70  Pulse: 99 88 114 103  Temp: 98.2 F (36.8 C) 98.1 F (36.7 C) 98.2 F (36.8 C) 98 F (36.7 C)  TempSrc: Oral Oral Oral Oral  Resp: Height:      Weight:      SpO2: 99% 99% 98% 97%  Intake/Output Summary (Last 24 hours) at 08/18/15 1042 Last data filed at 08/18/15 0457  Gross per 24 hour  Intake   2133 ml  Output    775 ml  Net   1358 ml   Filed Weights   08/14/15 0600  Weight: 108 lb 14.5 oz (49.4 kg)    Examination:  General exam: Appears calm and comfortable, very thin Respiratory system: Clear to auscultation. Respiratory effort normal. Cardiovascular system: S1 & S2 heard, mild tachycardia. No murmurs, rubs,  gallops or clicks. No pedal edema. Gastrointestinal system: Abdomen is nondistended, soft and nontender.  Normal bowel sounds heard. Central nervous system: Alert and oriented. No focal neurological deficits. Skin: No rashes, lesions or ulcers Psychiatry: Judgement and insight appear normal. Mood & affect appropriate.     Data Reviewed: I have personally reviewed following labs and imaging studies  CBC:  Recent Labs Lab 08/13/15 2120 08/17/15 0429 08/18/15 0533  WBC 6.4 8.2 7.0  HGB 15.3 12.8* 14.2  HCT 42.4 36.3* 40.0  MCV 82.2 81.8 83.3  PLT 256 215 245   Basic Metabolic Panel:  Recent Labs Lab 08/14/15 0537 08/14/15 0605 08/15/15 0508 08/16/15 0418 08/17/15 0429 08/18/15 0533  NA 141  --  141 139 138 135  K 4.1  --  3.7 3.6 3.6 3.6  CL 110  --  107 105 105 102  CO2 21*  --  24 26 24 24   GLUCOSE 152*  --  162* 132* 69 77  BUN 13  --  10 6 7 6   CREATININE 0.65  --  0.57* 0.46* 0.49* 0.47*  CALCIUM 9.2  --  8.9 9.3 9.2 8.9  MG  --  1.8  --   --   --   --   PHOS  --  2.9  --   --   --   --    Liver Function Tests:  Recent Labs Lab 08/15/15 0508 08/16/15 0418 08/17/15 0429 08/18/15 0533  AST 18 15 13* 12*  ALT 22 21 19  16*  ALKPHOS 51 51 52 48  BILITOT 1.7* 1.7*  1.8* 2.2* 2.3*  PROT 6.2* 6.5 6.4* 6.7  ALBUMIN 3.8 4.1 4.2 4.1    Recent Labs Lab 08/13/15 2120  LIPASE 17   CBG:  Recent Labs Lab 08/17/15 1710 08/17/15 2101 08/17/15 2128 08/17/15 2330 08/18/15 0745  GLUCAP 67 61* 73 105* 108*   Urine analysis:    Component Value Date/Time   COLORURINE YELLOW 08/14/2015 1240   APPEARANCEUR CLOUDY* 08/14/2015 1240   LABSPEC 1.030 08/14/2015 1240   PHURINE 5.5 08/14/2015 1240   GLUCOSEU 500* 08/14/2015 1240   HGBUR NEGATIVE 08/14/2015 1240   BILIRUBINUR NEGATIVE 08/14/2015 1240   KETONESUR >80* 08/14/2015 1240   PROTEINUR NEGATIVE 08/14/2015 1240   UROBILINOGEN 0.2 09/24/2012 1037   NITRITE NEGATIVE 08/14/2015 1240   LEUKOCYTESUR  NEGATIVE 08/14/2015 1240    Recent Results (from the past 240 hour(s))  MRSA PCR Screening     Status: None   Collection Time: 08/14/15  2:16 AM  Result Value Ref Range Status   MRSA by PCR NEGATIVE NEGATIVE Final    Comment:        The GeneXpert MRSA Assay (FDA approved for NASAL specimens only), is one component of a comprehensive MRSA colonization surveillance program. It is not intended to diagnose MRSA infection nor to guide or monitor treatment for MRSA infections.   Urine culture     Status: Abnormal   Collection Time: 08/14/15 12:40  PM  Result Value Ref Range Status   Specimen Description URINE, CLEAN CATCH  Final   Special Requests NONE  Final   Culture (A)  Final    60,000 COLONIES/mL DIPHTHEROIDS(CORYNEBACTERIUM SPECIES) Standardized susceptibility testing for this organism is not available. Performed at [facility]    Report Status 08/16/2015 FINAL  Final     Radiology Studies: No results found.   Scheduled Meds: . antiseptic oral rinse  7 mL Mouth Rinse BID  . DULoxetine  60 mg Oral Daily  . enoxaparin (LOVENOX) injection  40 mg Subcutaneous Q24H  . feeding supplement (GLUCERNA SHAKE)  237 mL Oral BID BM  . insulin aspart  0-5 Units Subcutaneous QHS  . insulin aspart  0-9 Units Subcutaneous TID WC  . insulin detemir  20 Units Subcutaneous Daily  . metoCLOPramide (REGLAN) injection  5 mg Intravenous 4 times per day  . pantoprazole (PROTONIX) IV  40 mg Intravenous Daily  . pregabalin  25 mg Oral TID  . traMADol  50 mg Oral TID   Continuous Infusions: . 0.9 % NaCl with KCl 20 mEq / L 100 mL/hr at 08/18/15 0452     LOS: 4 days    Time spent: 30 minutes    Debe Coder, MD [facility] Pager [phone]  If 7PM-7AM, please contact night-coverage [email or web address] Password [facility] 08/18/2015, 10:42 AM

## 2015-08-19 ENCOUNTER — Other Ambulatory Visit: Payer: Self-pay

## 2015-08-19 LAB — COMPREHENSIVE METABOLIC PANEL
ALBUMIN: 4 g/dL (ref 3.5–5.0)
ALK PHOS: 52 U/L (ref 38–126)
ALT: 15 U/L — ABNORMAL LOW (ref 17–63)
ANION GAP: 12 (ref 5–15)
AST: 11 U/L — ABNORMAL LOW (ref 15–41)
BILIRUBIN TOTAL: 3 mg/dL — AB (ref 0.3–1.2)
BUN: 7 mg/dL (ref 6–20)
CALCIUM: 9.1 mg/dL (ref 8.9–10.3)
CO2: 24 mmol/L (ref 22–32)
CREATININE: 0.56 mg/dL — AB (ref 0.61–1.24)
Chloride: 98 mmol/L — ABNORMAL LOW (ref 101–111)
GFR calc non Af Amer: >60 mL/min (ref >60)
GLUCOSE: 218 mg/dL — AB (ref 65–99)
Potassium: 4.2 mmol/L (ref 3.5–5.1)
Sodium: 134 mmol/L — ABNORMAL LOW (ref 135–145)
Total Protein: 6.4 g/dL — ABNORMAL LOW (ref 6.5–8.1)

## 2015-08-19 LAB — GLUCOSE, CAPILLARY
Glucose-Capillary: 284 mg/dL — ABNORMAL HIGH (ref 65–99)
Glucose-Capillary: 241 mg/dL — ABNORMAL HIGH (ref 65–99)

## 2015-08-19 MED ORDER — INSULIN DETEMIR 100 UNIT/ML ~~LOC~~ SOLN: 18.0000 [IU] | Freq: Every day | SUBCUTANEOUS | Status: DC

## 2015-08-19 MED ORDER — ONDANSETRON HCL 4 MG PO TABS: 4.0000 mg | ORAL_TABLET | Freq: Three times a day (TID) | ORAL | Status: DC | PRN

## 2015-08-19 NOTE — Progress Notes (Signed)
Inpatient Diabetes Program Recommendations  AACE/ADA: New Consensus Statement on Inpatient Glycemic Control (2015)  Target Ranges:  Prepandial:   less than 140 mg/dL      Peak postprandial:   less than 180 mg/dL (1-2 hours)      Critically ill patients:  140 - 180 mg/dL   Review of Glycemic Control  Results for Shawn Maldonado, Shawn Maldonado (MRN [id number]) as of 08/19/2015 09:35  Ref. Range 08/18/2015 07:45 08/18/2015 11:59 08/18/2015 16:57 08/18/2015 22:08 08/19/2015 07:54  Glucose-Capillary Latest Ref Range: 65-99 mg/dL 696108 (H) 295175 (H) 284230 (H) 183 (H) 284 (H)   High blood sugars this am. Did not receive any basal insulin yesterday. Received 5 units of correction Novolog yesterday.   Inpatient Diabetes Program Recommendations:    Levemir 18 units QD to start this am. If po intake improves, will need small amount of meal coverage insulin. Will continue to follow.  Thank you. Ailene Ardshonda Anguel Delapena, RD, LDN, CDE Inpatient Diabetes Coordinator [phone]

## 2015-08-19 NOTE — Progress Notes (Signed)
NUTRITION NOTE  Pt seen for initial RD assessment 4/16. RD visited pt for follow-up visit; pt with d/c order in place and walking down the hall toward elevators with significant other at time of writing this note. Pt's diet FLD and significant other requested information related to this diet. Provided pt with handout outlining appropriate foods and beverages on this diet order; no other nutrition-related questions or concerns at this time. Pt reports he consumed a small amount of apple juice and yogurt earlier this AM without associated abdominal pain or nausea. He states that 2-3 days ago he attempted to drink Glucerna Shake and experienced nausea at that time.   DM Coordinator saw pt earlier this AM.   Trenton GammonJessica Casilda Pickerill, RD, LDN Inpatient Clinical Dietitian Pager # [phone] After hours/weekend pager # [phone]

## 2015-08-19 NOTE — Progress Notes (Signed)
Shawn Maldonado to be D/C'd Home per MD order.  Discussed prescriptions and follow up appointments with the patient. Prescriptions given to patient, medication list explained in detail. Pt verbalized understanding.    Medication List    STOP taking these medications        ciprofloxacin 500 MG tablet  Commonly known as:  CIPRO      TAKE these medications        DULoxetine 60 MG capsule  Commonly known as:  CYMBALTA  Take 60 mg by mouth daily.     insulin aspart 100 UNIT/ML injection  Commonly known as:  novoLOG  Inject 4 Units into the skin 3 (three) times daily with meals.     insulin detemir 100 UNIT/ML injection  Commonly known as:  LEVEMIR  Inject 0.18 mLs (18 Units total) into the skin at bedtime.     metFORMIN 500 MG 24 hr tablet  Commonly known as:  GLUCOPHAGE-XR  Take 500 mg by mouth daily with breakfast.     ondansetron 4 MG tablet  Commonly known as:  ZOFRAN  Take 1 tablet (4 mg total) by mouth every 8 (eight) hours as needed for nausea or vomiting.     pregabalin 25 MG capsule  Commonly known as:  LYRICA  Take 1 capsule (25 mg total) by mouth 3 (three) times daily.     traMADol 50 MG tablet  Commonly known as:  ULTRAM  Take 50 mg by mouth 3 (three) times daily.        Filed Vitals:   08/18/15 2152 08/19/15 0627  BP: 122/74 138/86  Pulse: 113 102  Temp: 98.4 F (36.9 C) 98.4 F (36.9 C)  Resp: 18 18    Skin clean, dry and intact without evidence of skin break down, no evidence of skin tears noted. IV catheter discontinued intact. Site without signs and symptoms of complications. Dressing and pressure applied. Pt denies pain at this time. No complaints noted.  An After Visit Summary was printed and given to the patient. Patient escorted via WC, and D/C home via private auto.  Viviana SimplerDumas, Cheney Gosch S 08/19/2015 1:10 PM

## 2015-08-19 NOTE — Progress Notes (Signed)
PROGRESS NOTE    Sheryn BisonDario Genao  ZOX:[id number] DOB: 07/16/1985 DOA: 08/13/2015 PCP: [facility]    Brief Narrative: 30 year old male with a history of diabetes mellitus type 1 and peripheral neuropathy presented with one-day history of nausea, vomiting, abdominal pain with elevated glucose. The patient states that he had run out of his Lyrica and tramadol for his neuropathy. He felt like he was experiencing some withdrawal with symptoms of vomiting and diarrhea. He states that he still took his insulin despite having the nausea and vomiting. He endorses compliance with his insulin since his previous admission for DKA in December 2016. He denies any fevers, chills, recent URI type symptoms, dysuria, hematuria, headache, neck pain.Upon presentation, the patient was noted to have serum glucose of 372 with anion gap of 19 and ketonuria. The patient was started on intravenous insulin and fluid resuscitation. On 08/14/2015, the patient was transitioned to subcutaneous insulin unfortunately, the patient had difficulty tolerating his diet on 2 separate occasions with diet advancement. Case was discussed with GI, Dr. Matthias HughsBuccini and a 72 hour plan for advancement of diet was initiated.     Assessment & Plan:   DKA (diabetic ketoacidoses) in the setting of Type I diabetes mellitus with Peripheral neuropathy  - 08/14/2015 HbA1c was 9.3 - Possible opiate withdrawal contributing, back on his tramadol - Continue intravenous fluids at NS at 100cc/hr for today - Advance diet to full liquids - Continue levemir and SSI - home doses are 15-20 units of lantus BID and 4 units of novolog TID - Continue home lyrica and duloxetine - BS higher this morning, likely related to decrease levemir.  As he begins eating, will need to increase levemir accordingly    Intractable vomiting and nausea - No vomiting in 48 hours - Continue phenergan and Zofran PRN for nausea - Plan discussed between Dr. Arbutus Leasat and Dr.  Matthias HughsBuccini (GI) includes advancement to 2 oz of clears or water q 30 min while awake for 24 hours (DONE)--> try full liquids for 24 hours (attempting today) --> if still intolerant, formal GI consult.  - Reinforced full liquid diet today and nature of gastroparesis.  He seems excited at the idea of going home.  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  He does not have signs/symptoms of hemolysis.  He does not have heart failure or known liver disease.   - no abdominal pain, no jaundice noted, continue to trend   DVT prophylaxis: Lovenox Code Status: Full Disposition Plan: Pending advancement of diet, likely discharge in 1-2 days   Consultants:   GI - phone only for now, recs above  Procedures:   08/14/15  Abdomen xray IMPRESSION: Unremarkable bowel gas pattern; no free intra-abdominal air seen. Small amount of stool noted in the colon.  Antimicrobials:   None    Subjective: Doing better this morning.  Tolerated clear liquids this morning.  No nausea but his stomach does feel "full"  Objective: Filed Vitals:   08/18/15 0452 08/18/15 1350 08/18/15 2152 08/19/15 0627  BP: 124/70 116/75 122/74 138/86  Pulse: 103 132 113 102  Temp: 98 F (36.7 C) 98.2 F (36.8 C) 98.4 F (36.9 C) 98.4 F (36.9 C)  TempSrc: Oral Oral Oral Oral  Resp: 16 18 18 18   Height:      Weight:      SpO2: 97% 97% 99% 100%    Intake/Output Summary (Last 24 hours) at 08/19/15 0718 Last data filed at 08/19/15 0600  Gross per 24 hour  Intake 2947.08 ml  Output    400 ml  Net 2547.08 ml   Filed Weights   08/14/15 0600  Weight: 108 lb 14.5 oz (49.4 kg)    Examination:  General exam: Appears calm and comfortable, very thin, tattoos  Respiratory system: Clear to auscultation. Respiratory effort normal. Cardiovascular system: S1 & S2 heard, mild tachycardia which is improved from yesterday. No murmurs, rubs. No pedal edema. Gastrointestinal  system: Abdomen is nondistended, soft and nontender.  Normal bowel sounds heard. Central nervous system: Alert and oriented. No focal neurological deficits. Skin: No rashes, lesions Psychiatry: Judgement and insight appear normal. Mood & affect appropriate.     Data Reviewed: I have personally reviewed following labs and imaging studies  CBC:  Recent Labs Lab 08/13/15 2120 08/17/15 0429 08/18/15 0533  WBC 6.4 8.2 7.0  HGB 15.3 12.8* 14.2  HCT 42.4 36.3* 40.0  MCV 82.2 81.8 83.3  PLT 256 215 245   Basic Metabolic Panel:  Recent Labs Lab 08/14/15 0605 08/15/15 0508 08/16/15 0418 08/17/15 0429 08/18/15 0533 08/19/15 0513  NA  --  141 139 138 135 134*  K  --  3.7 3.6 3.6 3.6 4.2  CL  --  107 105 105 102 98*  CO2  --  GLUCOSE  --  162* 132* 69 77 218*  BUN  --  CREATININE  --  0.57* 0.46* 0.49* 0.47* 0.56*  CALCIUM  --  8.9 9.3 9.2 8.9 9.1  MG 1.8  --   --   --   --   --   PHOS 2.9  --   --   --   --   --    Liver Function Tests:  Recent Labs Lab 08/15/15 0508 08/16/15 0418 08/17/15 0429 08/18/15 0533 08/19/15 0513  AST 18 15 13* 12* 11*  ALT 16* 15*  ALKPHOS 51 51 52 48 52  BILITOT 1.7* 1.7*  1.8* 2.2* 2.3* 3.0*  PROT 6.2* 6.5 6.4* 6.7 6.4*  ALBUMIN 3.8 4.1 4.2 4.1 4.0    Recent Labs Lab 08/13/15 2120  LIPASE 17   CBG:  Recent Labs Lab 08/17/15 2330 08/18/15 0745 08/18/15 1159 08/18/15 1657 08/18/15 2208  GLUCAP 105* 108* 175* 230* 183*   Urine analysis:    Component Value Date/Time   COLORURINE YELLOW 08/14/2015 1240   APPEARANCEUR CLOUDY* 08/14/2015 1240   LABSPEC 1.030 08/14/2015 1240   PHURINE 5.5 08/14/2015 1240   GLUCOSEU 500* 08/14/2015 1240   HGBUR NEGATIVE 08/14/2015 1240   BILIRUBINUR NEGATIVE 08/14/2015 1240   KETONESUR >80* 08/14/2015 1240   PROTEINUR NEGATIVE 08/14/2015 1240   UROBILINOGEN 0.2 09/24/2012 1037   NITRITE NEGATIVE 08/14/2015 1240   LEUKOCYTESUR NEGATIVE 08/14/2015  1240    Recent Results (from the past 240 hour(s))  MRSA PCR Screening     Status: None   Collection Time: 08/14/15  2:16 AM  Result Value Ref Range Status   MRSA by PCR NEGATIVE NEGATIVE Final    Comment:        The GeneXpert MRSA Assay (FDA approved for NASAL specimens only), is one component of a comprehensive MRSA colonization surveillance program. It is not intended to diagnose MRSA infection nor to guide or monitor treatment for MRSA infections.   Urine culture     Status: Abnormal   Collection Time: 08/14/15 12:40 PM  Result Value Ref Range Status  Specimen Description URINE, CLEAN CATCH  Final   Special Requests NONE  Final   Culture (A)  Final    60,000 COLONIES/mL DIPHTHEROIDS(CORYNEBACTERIUM SPECIES) Standardized susceptibility testing for this organism is not available. Performed at [facility]    Report Status 08/16/2015 FINAL  Final     Radiology Studies: No results found.   Scheduled Meds: . antiseptic oral rinse  7 mL Mouth Rinse BID  . DULoxetine  60 mg Oral Daily  . enoxaparin (LOVENOX) injection  40 mg Subcutaneous Q24H  . feeding supplement (GLUCERNA SHAKE)  237 mL Oral BID BM  . insulin aspart  0-5 Units Subcutaneous QHS  . insulin aspart  0-9 Units Subcutaneous TID WC  . insulin detemir  18 Units Subcutaneous Daily  . metoCLOPramide (REGLAN) injection  5 mg Intravenous 4 times per day  . pantoprazole (PROTONIX) IV  40 mg Intravenous Daily  . pregabalin  25 mg Oral TID  . traMADol  50 mg Oral TID   Continuous Infusions: . 0.9 % NaCl with KCl 20 mEq / L 125 mL/hr at 08/18/15 2155     LOS: 5 days    Time spent: 35 minutes    Debe Coder, MD [facility] Pager [phone]  If 7PM-7AM, please contact night-coverage [email or web address] Password [facility] 08/19/2015, 7:18 AM

## 2015-08-19 NOTE — Discharge Summary (Signed)
Physician Discharge Summary  Shawn Maldonado ZOX:[id number] DOB: 03-Apr-1986 DOA: 08/13/2015  PCP: [facility]  Admit date: 08/13/2015 Discharge date: 08/19/2015  Recommendations for Outpatient Follow-up:  1. Pt will need to follow up with PCP in 1-2 weeks post discharge 2. Please obtain CMP to evaluate electrolytes and kidney function and his bilirubin 3. He needs review of blood sugar log and decision about whether to increase insulin.  He was discharged on Lantus 18 units in the evening only, normally on BID dosing.   Discharge Diagnoses:   DKA (diabetic ketoacidoses) (HCC) Type I diabetes mellitus (HCC) Peripheral neuropathy (HCC) Opiate withdrawal (HCC) Intractable vomiting Hyperbilirubinemia  Discharge Condition: Stable  Diet recommendation: Diabetic, low carb healthy diet   History of present illness:  30 year old male with a history of diabetes mellitus type 1 and peripheral neuropathy presented with one-day history of nausea, vomiting, abdominal pain with elevated glucose. The patient states that he had run out of his Lyrica and tramadol for his neuropathy. He felt like he was experiencing some withdrawal with symptoms of vomiting and diarrhea. He states that he still took his insulin despite having the nausea and vomiting. He endorses compliance with his insulin since his previous admission for DKA in December 2016. He denies any fevers, chills, recent URI type symptoms, dysuria, hematuria, headache, neck pain.Upon presentation, the patient was noted to have serum glucose of 372 with anion gap of 19 and ketonuria. The patient was started on intravenous insulin and fluid resuscitation. On 08/14/2015, the patient was transitioned to subcutaneous insulin unfortunately, the patient had difficulty tolerating his diet on 2 separate occasions with diet advancement. Case was discussed with GI, Dr. Matthias Hughs and a 72 hour plan for advancement of diet was initiated.   Hospital Course:   DKA (diabetic ketoacidoses)  [name] was initially admitted to the inpatient service with a serum bicarb of 19 and AG of 19.  He had intractable nausea and vomiting which was attributed to either withdrawal from his lyrica/tramadol which he had run out of, or gastroenteritis.  He was intially started on Insulin infusion.  He was quickly able to be transitioned to SQ insulin and his AG improved to 10.  He was initiated on Levemir at 20 units.  This had to be modified somewhat throughout the stay and he eventually went home on 18 units of Lantus with plans to continue his meal coverage and add back his AM lantus as tolerated.  The main issue that limited his discharge was intractable vomiting which is noted below.    Type I diabetes mellitus  Home doses on insulin included levemir 15-20 units BID and 4 units of Novolog with meals.  He was sent home on 18 units at night and the meal coverage.  He should see his PCP in 1-2 weeks.  He should check his blood sugars TID and add back his morning dose of levemir when his sugars are averaging higher.    Peripheral neuropathy Home cymbalta and lyrica were initiated.  No acute issues while inpatient.   Opiate withdrawal  Possibly a cause of his vomiting, however, he was only on tramadol.  This can cause a withdrawal syndrome.  Was advised to not run out of these medications again if possible.   Intractable vomiting Though to most likely be gastritis vs. Gastroparesis.  He was initiated on reglan, PPI and antiemetics.  He had an abdominal xray with a nonspecific bowel gas pattern and no obstruction.  Dr. Matthias Hughs discussed the case with  Dr. Arbutus Leas and a very slow re-initiation of food was done over 72 hours which helped him tolerate food.  On day of discharge, he was no longer requiring antiemetics and was tolerating food.    Hyperbilirubinemia He had an indirect bilirubinemia which was asymptomatic.  Possibility of Gilbert's syndrome was discussed.  As he was  completely asymptomatic, outpatient follow up was preferred.     Procedures/Studies: Dg Abd 2 Views  08/14/2015  CLINICAL DATA:  Acute onset of intractable vomiting. Generalized abdominal pain. Initial encounter. EXAM: ABDOMEN - 2 VIEW COMPARISON:  None. FINDINGS: The visualized bowel gas pattern is unremarkable. Scattered air and stool filled loops of colon are seen; no abnormal dilatation of small bowel loops is seen to suggest small bowel obstruction. No free intra-abdominal air is identified on the provided decubitus view. The visualized osseous structures are within normal limits; the sacroiliac joints are unremarkable in appearance. The visualized lung bases are essentially clear. IMPRESSION: Unremarkable bowel gas pattern; no free intra-abdominal air seen. Small amount of stool noted in the colon. Electronically Signed   By: Roanna Raider M.D.   On: 08/14/2015 18:15      Consultations:  none  Antibiotics:  none  Discharge Exam: Filed Vitals:   08/18/15 2152 08/19/15 0627  BP: 122/74 138/86  Pulse: 113 102  Temp: 98.4 F (36.9 C) 98.4 F (36.9 C)  Resp: 18 18   Filed Vitals:   08/18/15 0452 08/18/15 1350 08/18/15 2152 08/19/15 0627  BP: 124/70 116/75 122/74 138/86  Pulse: 103 132 113 102  Temp: 98 F (36.7 C) 98.2 F (36.8 C) 98.4 F (36.9 C) 98.4 F (36.9 C)  TempSrc: Oral Oral Oral Oral  Resp: 16 18 18 18   Height:      Weight:      SpO2: 97% 97% 99% 100%    General exam: Appears calm and comfortable, very thin, tattoos  Respiratory system: Clear to auscultation. Respiratory effort normal. Cardiovascular system: S1 & S2 heard, mild tachycardia which is improved from yesterday. No murmurs, rubs. No pedal edema. Gastrointestinal system: Abdomen is nondistended, soft and nontender. Normal bowel sounds heard. Central nervous system: Alert and oriented. No focal neurological deficits. Skin: No rashes, lesions Psychiatry: Judgement and insight appear normal. Mood  & affect appropriate.   Discharge Instructions  Discharge Instructions    Ambulatory referral to Nutrition and Diabetic Education    Complete by:  As directed   Type 1 diabetes. DKA admission     Discharge patient    Complete by:  As directed   To home            Medication List    STOP taking these medications        ciprofloxacin 500 MG tablet  Commonly known as:  CIPRO      TAKE these medications        DULoxetine 60 MG capsule  Commonly known as:  CYMBALTA  Take 60 mg by mouth daily.     insulin aspart 100 UNIT/ML injection  Commonly known as:  novoLOG  Inject 4 Units into the skin 3 (three) times daily with meals.     insulin detemir 100 UNIT/ML injection  Commonly known as:  LEVEMIR  Inject 0.18 mLs (18 Units total) into the skin at bedtime.     metFORMIN 500 MG 24 hr tablet  Commonly known as:  GLUCOPHAGE-XR  Take 500 mg by mouth daily with breakfast.     ondansetron 4 MG tablet  Commonly  known as:  ZOFRAN  Take 1 tablet (4 mg total) by mouth every 8 (eight) hours as needed for nausea or vomiting.     pregabalin 25 MG capsule  Commonly known as:  LYRICA  Take 1 capsule (25 mg total) by mouth 3 (three) times daily.     traMADol 50 MG tablet  Commonly known as:  ULTRAM  Take 50 mg by mouth 3 (three) times daily.           Follow-up Information    Follow up with [facility]. Schedule an appointment as soon as possible for a visit in 3 days.   Specialty:  Pediatrics   Contact information:   [address] [place] [address] [phone]        The results of significant diagnostics from this hospitalization (including imaging, microbiology, ancillary and laboratory) are listed below for reference.     Microbiology: Recent Results (from the past 240 hour(s))  MRSA PCR Screening     Status: None   Collection Time: 08/14/15  2:16 AM  Result Value Ref Range Status   MRSA by PCR NEGATIVE NEGATIVE Final    Comment:         The GeneXpert MRSA Assay (FDA approved for NASAL specimens only), is one component of a comprehensive MRSA colonization surveillance program. It is not intended to diagnose MRSA infection nor to guide or monitor treatment for MRSA infections.   Urine culture     Status: Abnormal   Collection Time: 08/14/15 12:40 PM  Result Value Ref Range Status   Specimen Description URINE, CLEAN CATCH  Final   Special Requests NONE  Final   Culture (A)  Final    60,000 COLONIES/mL DIPHTHEROIDS(CORYNEBACTERIUM SPECIES) Standardized susceptibility testing for this organism is not available. [facility]    Report Status 08/16/2015 FINAL  Final     Labs: Basic Metabolic Panel:  Recent Labs Lab 08/14/15 0605 08/15/15 0508 08/16/15 0418 08/17/15 0429 08/18/15 0533 08/19/15 0513  NA  --  141 139 138 135 134*  K  --  3.7 3.6 3.6 3.6 4.2  CL  --  107 105 105 102 98*  CO2  --  24 26 24 24 24   GLUCOSE  --  162* 132* 69 77 218*  BUN  --  10 6 7 6 7   CREATININE  --  0.57* 0.46* 0.49* 0.47* 0.56*  CALCIUM  --  8.9 9.3 9.2 8.9 9.1  MG 1.8  --   --   --   --   --   PHOS 2.9  --   --   --   --   --    Liver Function Tests:  Recent Labs Lab 08/15/15 0508 08/16/15 0418 08/17/15 0429 08/18/15 0533 08/19/15 0513  AST 18 15 13* 12* 11*  ALT 22 21 19  16* 15*  ALKPHOS 51 51 52 48 52  BILITOT 1.7* 1.7*  1.8* 2.2* 2.3* 3.0*  PROT 6.2* 6.5 6.4* 6.7 6.4*  ALBUMIN 3.8 4.1 4.2 4.1 4.0    Recent Labs Lab 08/13/15 2120  LIPASE 17   No results for input(s): AMMONIA in the last 168 hours. CBC:  Recent Labs Lab 08/13/15 2120 08/17/15 0429 08/18/15 0533  WBC 6.4 8.2 7.0  HGB 15.3 12.8* 14.2  HCT 42.4 36.3* 40.0  MCV 82.2 81.8 83.3  PLT 256 215 245    CBG:  Recent Labs Lab 08/18/15 0745 08/18/15 1159 08/18/15 1657 08/18/15 2208  08/19/15 0754  GLUCAP 108* 175* 230* 183* 284*     SIGNED: Time coordinating discharge: 45 minutes  MULLEN, EMILY,  MD  [facility] 08/19/2015, 12:18 PM Pager [phone]  If 7PM-7AM, please contact night-coverage [email or web address] Password [email or web address]

## 2015-08-19 NOTE — Discharge Instructions (Signed)
[name] - -  You were admitted with very high blood sugar.  It is important that you monitor your blood sugar closely.  You should check your blood sugar up to 3 times per day.  You are going home on a lower dose of your long acting insulin, Levemir, because you have not been eating much in the hospital.  If you start eating a more normal diet for you, you may need to go up to your usual home dose of 15 units twice a day.  Please keep a close eye on your sugars to avoid this.   It is very important that you see your primary doctor or PCP within the next week to discuss your hospitalization and your medications going forward.  Please call and make an appointment TODAY with one of these providers.   You likely have gastroparesis because it took you quite a while to be able to tolerate food without nausea.  I am going to send you home with some nausea medication if you should have issues with nausea again.  If you develop vomiting or severe nausea again, call your doctor immediately.  If you notice that your blood sugars are running too high, call your doctor immediately.   It is important that you take all of your medications as scheduled and not miss medications.  This may have been what caused you to come in to the hospital.    If you develop uncontrolled pain or uncontrolled vomiting, call 911 and come back to the ED.   More information about gastroparesis is below.   Gastroparesis Gastroparesis, also called delayed gastric emptying, is a condition in which food takes longer than normal to empty from the stomach. The condition is usually long-lasting (chronic). CAUSES This condition may be caused by:  An endocrine disorder, such as hypothyroidism or diabetes. Diabetes is the most common cause of this condition.  A nervous system disease, such as Parkinson disease or multiple sclerosis.  Cancer, infection, or surgery of the stomach or vagus nerve.  A connective tissue disorder, such as  scleroderma.  Certain medicines. In most cases, the cause is not known. RISK FACTORS This condition is more likely to develop in:  People with certain disorders, including endocrine disorders, eating disorders, amyloidosis, and scleroderma.  People with certain diseases, including Parkinson disease or multiple sclerosis.  People with cancer or infection of the stomach or vagus nerve.  People who have had surgery on the stomach or vagus nerve.  People who take certain medicines.  Women. SYMPTOMS Symptoms of this condition include:  An early feeling of fullness when eating.  Nausea.  Weight loss.  Vomiting.  Heartburn.  Abdominal bloating.  Inconsistent blood glucose levels.  Lack of appetite.  Acid from the stomach coming up into the esophagus (gastroesophageal reflux).  Spasms of the stomach. Symptoms may come and go. DIAGNOSIS This condition is diagnosed with tests, such as:  Tests that check how long it takes food to move through the stomach and intestines. These tests include:  Upper gastrointestinal (GI) series. In this test, X-rays of the intestines are taken after you drink a liquid. The liquid makes the intestines show up better on the X-rays.  Gastric emptying scintigraphy. In this test, scans are taken after you eat food that contains a small amount of radioactive material.  Wireless capsule GI monitoring system. This test involves swallowing a capsule that records information about movement through the stomach.  Gastric manometry. This test measures electrical and  muscular activity in the stomach. It is done with a thin tube that is passed down the throat and into the stomach.  Endoscopy. This test checks for abnormalities in the lining of the stomach. It is done with a long, thin tube that is passed down the throat and into the stomach.  An ultrasound. This test can help rule out gallbladder disease or pancreatitis as a cause of your symptoms. It  uses sound waves to take pictures of the inside of your body. TREATMENT There is no cure for gastroparesis. This condition may be managed with:  Treatment of the underlying condition causing the gastroparesis.  Lifestyle changes, including exercise and dietary changes. Dietary changes can include:  Changes in what and when you eat.  Eating smaller meals more often.  Eating low-fat foods.  Eating low-fiber forms of high-fiber foods, such as cooked vegetables instead of raw vegetables.  Having liquid foods in place of solid foods. Liquid foods are easier to digest.  Medicines. These may be given to control nausea and vomiting and to stimulate stomach muscles.  Getting food through a feeding tube. This may be done in severe cases.  A gastric neurostimulator. This is a device that is inserted into the body with surgery. It helps improve stomach emptying and control nausea and vomiting. HOME CARE INSTRUCTIONS  Follow your health care provider's instructions about exercise and diet.  Take medicines only as directed by your health care provider. SEEK MEDICAL CARE IF:  Your symptoms do not improve with treatment.  You have new symptoms. SEEK IMMEDIATE MEDICAL CARE IF:  You have severe abdominal pain that does not improve with treatment.  You have nausea that does not go away.  You cannot keep fluids down.   This information is not intended to replace advice given to you by your health care provider. Make sure you discuss any questions you have with your health care provider.   Document Released: 04/17/2005 Document Revised: 09/01/2014 Document Reviewed: 04/13/2014 Elsevier Interactive Patient Education [employer]2016 Elsevier Inc.

## 2015-09-08 ENCOUNTER — Ambulatory Visit: Payer: Medicaid Other | Admitting: Nutrition

## 2015-09-15 ENCOUNTER — Ambulatory Visit: Payer: Medicaid Other | Admitting: Nutrition

## 2017-05-16 ENCOUNTER — Inpatient Hospital Stay (HOSPITAL_COMMUNITY): Payer: Medicaid Other

## 2017-05-16 ENCOUNTER — Encounter (HOSPITAL_COMMUNITY): Payer: Self-pay | Admitting: Emergency Medicine

## 2017-05-16 ENCOUNTER — Inpatient Hospital Stay (HOSPITAL_COMMUNITY)
Admission: EM | Admit: 2017-05-16 | Discharge: 2017-05-21 | DRG: 638 | Disposition: A | Discharge status: Home / Self Care | Payer: Medicaid Other | Attending: Internal Medicine | Admitting: Internal Medicine

## 2017-05-16 DIAGNOSIS — R740 Nonspecific elevation of levels of transaminase and lactic acid dehydrogenase [LDH]: Secondary | ICD-10-CM | POA: Diagnosis present

## 2017-05-16 DIAGNOSIS — K210 Gastro-esophageal reflux disease with esophagitis: Secondary | ICD-10-CM | POA: Diagnosis not present

## 2017-05-16 DIAGNOSIS — R000 Tachycardia, unspecified: Secondary | ICD-10-CM | POA: Diagnosis present

## 2017-05-16 DIAGNOSIS — R7401 Elevation of levels of liver transaminase levels: Secondary | ICD-10-CM | POA: Diagnosis present

## 2017-05-16 DIAGNOSIS — E875 Hyperkalemia: Secondary | ICD-10-CM | POA: Diagnosis not present

## 2017-05-16 DIAGNOSIS — R112 Nausea with vomiting, unspecified: Secondary | ICD-10-CM

## 2017-05-16 DIAGNOSIS — Z794 Long term (current) use of insulin: Secondary | ICD-10-CM

## 2017-05-16 DIAGNOSIS — N179 Acute kidney failure, unspecified: Secondary | ICD-10-CM | POA: Diagnosis present

## 2017-05-16 DIAGNOSIS — E1010 Type 1 diabetes mellitus with ketoacidosis without coma: Secondary | ICD-10-CM | POA: Diagnosis not present

## 2017-05-16 DIAGNOSIS — E876 Hypokalemia: Secondary | ICD-10-CM | POA: Diagnosis present

## 2017-05-16 DIAGNOSIS — Z79899 Other long term (current) drug therapy: Secondary | ICD-10-CM

## 2017-05-16 DIAGNOSIS — E860 Dehydration: Secondary | ICD-10-CM | POA: Diagnosis present

## 2017-05-16 DIAGNOSIS — E1043 Type 1 diabetes mellitus with diabetic autonomic (poly)neuropathy: Secondary | ICD-10-CM | POA: Diagnosis present

## 2017-05-16 DIAGNOSIS — F1010 Alcohol abuse, uncomplicated: Secondary | ICD-10-CM | POA: Diagnosis present

## 2017-05-16 DIAGNOSIS — E1143 Type 2 diabetes mellitus with diabetic autonomic (poly)neuropathy: Secondary | ICD-10-CM | POA: Diagnosis present

## 2017-05-16 DIAGNOSIS — K3184 Gastroparesis: Secondary | ICD-10-CM | POA: Diagnosis present

## 2017-05-16 DIAGNOSIS — R945 Abnormal results of liver function studies: Secondary | ICD-10-CM

## 2017-05-16 DIAGNOSIS — R7989 Other specified abnormal findings of blood chemistry: Secondary | ICD-10-CM | POA: Diagnosis present

## 2017-05-16 DIAGNOSIS — K1370 Unspecified lesions of oral mucosa: Secondary | ICD-10-CM | POA: Diagnosis not present

## 2017-05-16 DIAGNOSIS — D72829 Elevated white blood cell count, unspecified: Secondary | ICD-10-CM | POA: Diagnosis present

## 2017-05-16 DIAGNOSIS — E1110 Type 2 diabetes mellitus with ketoacidosis without coma: Secondary | ICD-10-CM | POA: Diagnosis present

## 2017-05-16 DIAGNOSIS — Z87891 Personal history of nicotine dependence: Secondary | ICD-10-CM | POA: Diagnosis not present

## 2017-05-16 LAB — I-STAT CHEM 8, ED
BUN: 19 mg/dL (ref 6–20)
Calcium, Ion: 1.27 mmol/L (ref 1.15–1.40)
Chloride: 107 mmol/L (ref 101–111)
Creatinine, Ser: 0.8 mg/dL (ref 0.61–1.24)
Glucose, Bld: 518 mg/dL (ref 65–99)
HEMATOCRIT: 54 % — AB (ref 39.0–52.0)
HEMOGLOBIN: 18.4 g/dL — AB (ref 13.0–17.0)
POTASSIUM: 6.5 mmol/L — AB (ref 3.5–5.1)
SODIUM: 138 mmol/L (ref 135–145)
TCO2: 9 mmol/L — ABNORMAL LOW (ref 22–32)

## 2017-05-16 LAB — COMPREHENSIVE METABOLIC PANEL
ALBUMIN: 5.2 g/dL — AB (ref 3.5–5.0)
ALT: 47 U/L (ref 17–63)
AST: 48 U/L — AB (ref 15–41)
Alkaline Phosphatase: 93 U/L (ref 38–126)
Anion gap: 23 — ABNORMAL HIGH (ref 5–15)
BUN: 15 mg/dL (ref 6–20)
CO2: 11 mmol/L — ABNORMAL LOW (ref 22–32)
CREATININE: 1 mg/dL (ref 0.61–1.24)
Calcium: 9.6 mg/dL (ref 8.9–10.3)
Chloride: 103 mmol/L (ref 101–111)
GFR calc Af Amer: >60 mL/min (ref >60)
GLUCOSE: 372 mg/dL — AB (ref 65–99)
Potassium: 4.4 mmol/L (ref 3.5–5.1)
Sodium: 137 mmol/L (ref 135–145)
Total Bilirubin: 1.9 mg/dL — ABNORMAL HIGH (ref 0.3–1.2)
Total Protein: 8.8 g/dL — ABNORMAL HIGH (ref 6.5–8.1)

## 2017-05-16 LAB — BLOOD GAS, VENOUS
ACID-BASE DEFICIT: 25 mmol/L — AB (ref 0.0–2.0)
Bicarbonate: 6.6 mmol/L — ABNORMAL LOW (ref 20.0–28.0)
O2 Saturation: 75.6 %
PCO2 VEN: 26.2 mmHg — AB (ref 44.0–60.0)
PH VEN: 7.03 — AB (ref 7.250–7.430)
Patient temperature: 98.6
pO2, Ven: 52 mmHg — ABNORMAL HIGH (ref 32.0–45.0)

## 2017-05-16 LAB — CBG MONITORING, ED
GLUCOSE-CAPILLARY: 377 mg/dL — AB (ref 65–99)
Glucose-Capillary: 448 mg/dL — ABNORMAL HIGH (ref 65–99)
Glucose-Capillary: 420 mg/dL — ABNORMAL HIGH (ref 65–99)

## 2017-05-16 LAB — URINALYSIS, ROUTINE W REFLEX MICROSCOPIC
Bacteria, UA: NONE SEEN
Bilirubin Urine: NEGATIVE
Glucose, UA: >=500 mg/dL — AB
Ketones, ur: 80 mg/dL — AB
Leukocytes, UA: NEGATIVE
Nitrite: NEGATIVE
PH: 5 (ref 5.0–8.0)
Protein, ur: 30 mg/dL — AB
SPECIFIC GRAVITY, URINE: 1.017 (ref 1.005–1.030)

## 2017-05-16 LAB — CBC
HCT: 45.2 % (ref 39.0–52.0)
Hemoglobin: 15.6 g/dL (ref 13.0–17.0)
MCH: 31.3 pg (ref 26.0–34.0)
MCHC: 34.5 g/dL (ref 30.0–36.0)
MCV: 90.8 fL (ref 78.0–100.0)
PLATELETS: 293 10*3/uL (ref 150–400)
RBC: 4.98 MIL/uL (ref 4.22–5.81)
RDW: 12.4 % (ref 11.5–15.5)
WBC: 15.4 10*3/uL — AB (ref 4.0–10.5)

## 2017-05-16 LAB — LIPASE, BLOOD: LIPASE: 16 U/L (ref 11–51)

## 2017-05-16 MED ORDER — ENOXAPARIN SODIUM 40 MG/0.4ML ~~LOC~~ SOLN
40.0000 mg | Freq: Every day | SUBCUTANEOUS | Status: DC
  Administered 2017-05-17 – 2017-05-20 (×5): 40 mg via SUBCUTANEOUS
  Filled 2017-05-16 (×5): qty 0.4

## 2017-05-16 MED ORDER — SODIUM CHLORIDE 0.9 % IV SOLN
INTRAVENOUS | Status: DC
  Filled 2017-05-16: qty 1

## 2017-05-16 MED ORDER — DEXTROSE-NACL 5-0.45 % IV SOLN
INTRAVENOUS | Status: DC
  Administered 2017-05-17 (×2): via INTRAVENOUS

## 2017-05-16 MED ORDER — DEXTROSE-NACL 5-0.45 % IV SOLN: INTRAVENOUS | Status: DC

## 2017-05-16 MED ORDER — ACETAMINOPHEN 650 MG RE SUPP: 650.0000 mg | Freq: Four times a day (QID) | RECTAL | Status: DC | PRN

## 2017-05-16 MED ORDER — SODIUM CHLORIDE 0.9 % IV BOLUS (SEPSIS)
1000.0000 mL | Freq: Once | INTRAVENOUS | Status: AC
  Administered 2017-05-16: 1000 mL via INTRAVENOUS

## 2017-05-16 MED ORDER — ACETAMINOPHEN 325 MG PO TABS: 650.0000 mg | ORAL_TABLET | Freq: Four times a day (QID) | ORAL | Status: DC | PRN

## 2017-05-16 MED ORDER — STERILE WATER FOR INJECTION IV SOLN
INTRAVENOUS | Status: DC
  Filled 2017-05-16: qty 900

## 2017-05-16 MED ORDER — IPRATROPIUM-ALBUTEROL 0.5-2.5 (3) MG/3ML IN SOLN: 3.0000 mL | Freq: Four times a day (QID) | RESPIRATORY_TRACT | Status: DC | PRN

## 2017-05-16 MED ORDER — SODIUM CHLORIDE 0.45 % IV SOLN: INTRAVENOUS | Status: DC

## 2017-05-16 MED ORDER — STERILE WATER FOR INJECTION IV SOLN
INTRAVENOUS | Status: AC
  Administered 2017-05-16: via INTRAVENOUS
  Filled 2017-05-16: qty 900

## 2017-05-16 MED ORDER — ONDANSETRON 4 MG PO TBDP
4.0000 mg | ORAL_TABLET | Freq: Once | ORAL | Status: AC | PRN
  Administered 2017-05-16: 4 mg via ORAL
  Filled 2017-05-16: qty 1

## 2017-05-16 MED ORDER — SODIUM CHLORIDE 0.9 % IV SOLN
INTRAVENOUS | Status: DC
  Administered 2017-05-17 (×2): via INTRAVENOUS

## 2017-05-16 MED ORDER — SODIUM CHLORIDE 0.9 % IV SOLN
INTRAVENOUS | Status: DC
  Administered 2017-05-16: 3.9 [IU]/h via INTRAVENOUS
  Filled 2017-05-16: qty 1

## 2017-05-16 MED ORDER — INSULIN ASPART 100 UNIT/ML ~~LOC~~ SOLN: 5.0000 [IU] | Freq: Once | SUBCUTANEOUS | Status: DC

## 2017-05-16 MED ORDER — ONDANSETRON HCL 4 MG/2ML IJ SOLN
4.0000 mg | Freq: Once | INTRAMUSCULAR | Status: AC
  Administered 2017-05-16: 4 mg via INTRAVENOUS
  Filled 2017-05-16: qty 2

## 2017-05-16 MED ORDER — ONDANSETRON HCL 4 MG/2ML IJ SOLN
4.0000 mg | Freq: Four times a day (QID) | INTRAMUSCULAR | Status: DC | PRN
  Administered 2017-05-17 (×3): 4 mg via INTRAVENOUS
  Filled 2017-05-16 (×3): qty 2

## 2017-05-16 MED ORDER — INSULIN ASPART 100 UNIT/ML ~~LOC~~ SOLN: 0.0000 [IU] | SUBCUTANEOUS | Status: DC

## 2017-05-16 MED ORDER — SODIUM CHLORIDE 0.9 % IV SOLN
1.0000 g | Freq: Once | INTRAVENOUS | Status: DC
  Filled 2017-05-16: qty 10

## 2017-05-16 MED ORDER — ONDANSETRON HCL 4 MG PO TABS: 4.0000 mg | ORAL_TABLET | Freq: Four times a day (QID) | ORAL | Status: DC | PRN

## 2017-05-16 NOTE — ED Notes (Signed)
Date and time results received: 05/16/17 2135  (use smartphrase ".now" to insert current time)  Test:K and ph Critical Value: 6.5 and 7.030  Name of Provider Notified:Claudia Margarette AsalGibbons

## 2017-05-16 NOTE — ED Triage Notes (Signed)
Patient c/o vomiting and hyperglycemia reports last took insulin last night. denies any pain

## 2017-05-16 NOTE — ED Notes (Signed)
Insulin verified by TD, RN

## 2017-05-16 NOTE — ED Notes (Signed)
I Stat Chem 8: K- 6.5 and glucose- 518 Rn and Provider notified

## 2017-05-16 NOTE — ED Provider Notes (Signed)
[facility]-EMERGENCY DEPT Provider Note   CSN: [id number] Arrival date & time: 05/16/17  1424     History   Chief Complaint Chief Complaint  Patient presents with  . Emesis    HPI Shawn Maldonado is a 32 y.o. male h/o T1DM presents for sudden onset nausea and vomiting, sweats, generalized malaise since 0700 today. Has not given himself insulin today due to vomiting. Had 3 mixed alcoholic drinks last night, usually does not drink that much at one time. Denies recent illness. Wife had 24 hour GI bug four days ago but he denies any diarrhea. No fevers, chills, recent illness, cough, CP, abdominal pain, dysuria, diarrhea. He feels like he is in DKA.   HPI  Past Medical History:  Diagnosis Date  . Diabetes mellitus without complication [facility](HCC)     Patient Active Problem List   Diagnosis Date Noted  . Hyperbilirubinemia 08/17/2015  . Intractable vomiting   . Type 1 diabetes mellitus with ketoacidosis without coma (HCC)   . Type I diabetes mellitus (HCC) 08/14/2015  . Peripheral neuropathy 08/14/2015  . Diabetic ketoacidosis without coma associated with type 1 diabetes mellitus (HCC)   . Opiate withdrawal (HCC)   . Acute upper respiratory infection 04/30/2015  . Hypokalemia 09/25/2012  . Diabetes mellitus (HCC) 09/24/2012  . DKA (diabetic ketoacidoses) (HCC) 09/24/2012  . Oral thrush 09/24/2012    Past Surgical History:  Procedure Laterality Date  . MOUTH SURGERY         Home Medications    Prior to Admission medications   Medication Sig Start Date End Date Taking? Authorizing Provider  insulin aspart (NOVOLOG) 100 UNIT/ML injection Inject 4 Units into the skin 3 (three) times daily with meals. Patient taking differently: Inject 20 Units into the skin 3 (three) times daily with meals.  05/02/15  Yes Albertine GratesXu, Fang, MD  insulin detemir (LEVEMIR) 100 UNIT/ML injection Inject 0.18 mLs (18 Units total) into the skin at bedtime. Patient taking differently: Inject  15 Units into the skin at bedtime.  08/19/15   Inez CatalinaMullen, Emily B, MD  ondansetron (ZOFRAN) 4 MG tablet Take 1 tablet (4 mg total) by mouth every 8 (eight) hours as needed for nausea or vomiting. 08/19/15   Inez CatalinaMullen, Emily B, MD    Family History Family History  Problem Relation Age of Onset  . Stroke Father   . CAD Other     Social History Social History   Tobacco Use  . Smoking status: Former Smoker    Types: Cigarettes  . Smokeless tobacco: Never Used  Substance Use Topics  . Alcohol use: Yes    Comment: social  . Drug use: Yes    Types: Marijuana     Allergies   Patient has no known allergies.   Review of Systems Review of Systems  Constitutional: Positive for appetite change and chills.  Gastrointestinal: Positive for nausea and vomiting.  All other systems reviewed and are negative.    Physical Exam Updated Vital Signs BP (!) 159/71 (BP Location: Left Arm)   Pulse (!) 124   Temp 97.7 F (36.5 C) (Oral)   Resp 20   SpO2 100%   Physical Exam  Constitutional: He is oriented to person, place, and time. He appears well-developed and well-nourished. No distress.  NAD. Diaphoretic. Appears uncomfortable.   HENT:  Head: Normocephalic and atraumatic.  Right Ear: External ear normal.  Left Ear: External ear normal.  Nose: Nose normal.  Dry mucous membranes.   Eyes: Conjunctivae and  EOM are normal. No scleral icterus.  Neck: Normal range of motion. Neck supple.  Cardiovascular: Regular rhythm, normal heart sounds and intact distal pulses. Tachycardia present.  No murmur heard. HR 115-120. No LE edema.   Pulmonary/Chest: Effort normal and breath sounds normal. Tachypnea noted. He has no wheezes.  RR 22-25  Abdominal: Soft. Normal appearance. There is tenderness.  Diffuse soreness to abdomen, non focal  Musculoskeletal: Normal range of motion. He exhibits no deformity.  Neurological: He is alert and oriented to person, place, and time.  Skin: Skin is warm and dry.  Capillary refill takes less than 2 seconds.  Diaphoretic   Psychiatric: He has a normal mood and affect. His behavior is normal. Judgment and thought content normal.  Nursing note and vitals reviewed.    ED Treatments / Results  Labs (all labs ordered are listed, but only abnormal results are displayed) Labs Reviewed  COMPREHENSIVE METABOLIC PANEL - Abnormal; Notable for the following components:      Result Value   CO2 11 (*)    Glucose, Bld 372 (*)    Total Protein 8.8 (*)    Albumin 5.2 (*)    AST 48 (*)    Total Bilirubin 1.9 (*)    Anion gap 23 (*)    All other components within normal limits  CBC - Abnormal; Notable for the following components:   WBC 15.4 (*)    All other components within normal limits  URINALYSIS, ROUTINE W REFLEX MICROSCOPIC - Abnormal; Notable for the following components:   Color, Urine STRAW (*)    Glucose, UA >=500 (*)    Hgb urine dipstick SMALL (*)    Ketones, ur 80 (*)    Protein, ur 30 (*)    Squamous Epithelial / LPF 0-5 (*)    All other components within normal limits  BLOOD GAS, VENOUS - Abnormal; Notable for the following components:   pH, Ven 7.030 (*)    pCO2, Ven 26.2 (*)    pO2, Ven 52.0 (*)    Bicarbonate 6.6 (*)    Acid-base deficit 25.0 (*)    All other components within normal limits  CBG MONITORING, ED - Abnormal; Notable for the following components:   Glucose-Capillary 377 (*)    All other components within normal limits  I-STAT CHEM 8, ED - Abnormal; Notable for the following components:   Potassium 6.5 (*)    Glucose, Bld 518 (*)    TCO2 9 (*)    Hemoglobin 18.4 (*)    HCT 54.0 (*)    All other components within normal limits  CBG MONITORING, ED - Abnormal; Notable for the following components:   Glucose-Capillary 448 (*)    All other components within normal limits  LIPASE, BLOOD    EKG  EKG Interpretation  Date/Time:  Wednesday May 16 2017 21:04:09 EST Ventricular Rate:  119 PR Interval:    QRS  Duration: 98 QT Interval:  305 QTC Calculation: 430 R Axis:   94 Text Interpretation:  Sinus tachycardia Biatrial enlargement Borderline right axis deviation ST elev, probable normal early repol pattern Peaked T waves Confirmed by Pricilla Loveless [phone]) on 05/16/2017 9:11:54 PM       Radiology No results found.  Procedures Procedures (including critical care time)  Medications Ordered in ED Medications  dextrose 5 %-0.45 % sodium chloride infusion (not administered)  insulin regular (NOVOLIN R,HUMULIN R) 100 Units in sodium chloride 0.9 % 100 mL (1 Units/mL) infusion (3.9 Units/hr Intravenous New Bag/Given  05/16/17 2228)  insulin aspart (novoLOG) injection 5 Units (not administered)  sodium chloride 0.9 % bolus 1,000 mL (not administered)  ondansetron (ZOFRAN-ODT) disintegrating tablet 4 mg (4 mg Oral Given 05/16/17 1533)  sodium chloride 0.9 % bolus 1,000 mL (0 mLs Intravenous Stopped 05/16/17 2232)  sodium chloride 0.9 % bolus 1,000 mL (0 mLs Intravenous Stopped 05/16/17 2233)  ondansetron (ZOFRAN) injection 4 mg (4 mg Intravenous Given 05/16/17 2116)     Initial Impression / Assessment and Plan / ED Course  I have reviewed the triage vital signs and the nursing notes.  Pertinent labs & imaging results that were available during my care of the patient were reviewed by me and considered in my medical decision making (see chart for details).  Clinical Course as of May 16 2236  Wed May 16, 2017  2039 Anion gap: (!) 23 [CG]  2039 Glucose: (!) 372 [CG]  2039 WBC: (!) 15.4 [CG]  2100 CO2: (!) 11 [CG]  2128 Potassium: (!!) 6.5 [CG]    Clinical Course User Index [CG] Liberty Handy, PA-C    Pt is a 32 y.o. male with hx T1DM who presents with nausea, vomiting since 0700. No insulin administered today due to vomiting. No recent URI symptoms, diarrhea, dysuria to raise suspicion for infectious etiology causing DKA.  On exam, pt in diaphoretic, tachycardic, tachypnic with  kussmaul's inspirations. Uncomfortable. Mentating well.  Afebrile. Dry appearing. Abdomen soft but diffusely tender, no signs of peritonitis.   Glucose 518.  K 6.5. Gap 23. VBG with pH 7.0, bicarb 6.6 confirming DKA. Gave insulin bolus. Glucostabilizer started. 3 L IVF running.  Will request admission to SDU. Patient shared with supervising physician.  Final Clinical Impressions(s) / ED Diagnoses   Final diagnoses:  Diabetic ketoacidosis without coma associated with type 1 diabetes mellitus [facility])    ED Discharge Orders    None       Liberty Handy, PA-C 05/16/17 2238    Little, Ambrose Finland, MD 05/17/17 [phone]

## 2017-05-16 NOTE — H&P (Signed)
History and Physical    Belton Peplinski UEA:[id number] DOB: 24-Mar-1986 DOA: 05/16/2017  Referring MD/NP/PA: Sharen Heck, PA-C PCP: [facility], [facility]  Patient coming from: Home  Chief Complaint: Vomiting  I have personally briefly reviewed patient's old medical records in [place] [facility]   HPI: Shawn Maldonado is a 32 y.o. male with medical history significant of type 1 diabetes; who presents with sudden onset of nausea and vomiting this morning at 7 AM.  Associated symptoms included generalized malaise, diaphoresis, polyuria, increased thirst, and reports wife recently had a 24-hour GI bug 4 days ago.  Denies any significant diarrhea, cough, chest pain, or dysuria. Patient reports having 3 mixed drinks last night, but does not usually drink on daily basis.  He last reported given himself insulin sometime last night.  The patient was last admitted into the hospital for DKA in 07/2015.  ED Course: Upon admission to the emergency department patient was found to be afebrile, pulse 104-128, respirations 18-24, blood pressure 138/92- 159/71, and o2 sats maintained on room air.  Labs revealed CBG 372 with anion gap of 23, sodium bicarb initially 11, and venous pH noted to be 7.03. urinalysis revealed signs of ketones and glucose but no signs of infection.  Patient was ordered to be given 3 L of normal saline IV fluids, 5 units of insulin and to be started on glucose stabilizer protocol. Repeat i-STAT labs revealed CO2   9, potassium 6.5, and glucose 518.  TRH called to admit.    Review of Systems  Constitutional: Positive for diaphoresis and malaise/fatigue. Negative for fever.  HENT: Negative for congestion and ear discharge.   Eyes: Negative for photophobia and pain.  Respiratory: Negative for cough and sputum production.   Cardiovascular: Negative for chest pain and orthopnea.  Gastrointestinal: Positive for nausea and vomiting. Negative for abdominal pain and diarrhea.  Genitourinary: Negative  for dysuria and frequency.  Musculoskeletal: Negative for myalgias and neck pain.  Skin: Negative for itching and rash.  Neurological: Positive for weakness. Negative for seizures and loss of consciousness.  Psychiatric/Behavioral: Negative for substance abuse.    Past Medical History:  Diagnosis Date  . Diabetes mellitus without complication [facility])     Past Surgical History:  Procedure Laterality Date  . MOUTH SURGERY       reports that he has quit smoking. His smoking use included cigarettes. he has never used smokeless tobacco. He reports that he drinks alcohol. He reports that he uses drugs. Drug: Marijuana.  No Known Allergies  Family History  Problem Relation Age of Onset  . Stroke Father   . CAD Other     Prior to Admission medications   Medication Sig Start Date End Date Taking? Authorizing Provider  insulin aspart (NOVOLOG) 100 UNIT/ML injection Inject 4 Units into the skin 3 (three) times daily with meals. Patient taking differently: Inject 20 Units into the skin 3 (three) times daily with meals.  05/02/15  Yes Albertine Grates, MD  insulin detemir (LEVEMIR) 100 UNIT/ML injection Inject 0.18 mLs (18 Units total) into the skin at bedtime. Patient taking differently: Inject 15 Units into the skin at bedtime.  08/19/15   Inez Catalina, MD  ondansetron (ZOFRAN) 4 MG tablet Take 1 tablet (4 mg total) by mouth every 8 (eight) hours as needed for nausea or vomiting. 08/19/15   Inez Catalina, MD    Physical Exam:  Constitutional: Thin and sick appearing young male Vitals:   05/16/17 1530 05/16/17 1810 05/16/17 2029 05/16/17 2220  BP: (!) 138/92 (!) 146/90 (!) 146/88 (!) 159/71  Pulse: (!) 104 (!) 109 (!) 119 (!) 124  Resp: 19 18 (!) 24 20  Temp: 97.6 F (36.4 C)  97.7 F (36.5 C)   TempSrc: Oral  Oral   SpO2: 100% 99% 99% 100%   Eyes: PERRL, lids and conjunctivae normal ENMT: Mucous membranes are dry. Posterior pharynx clear of any exudate or lesions.Normal dentition.    Neck: normal, supple, no masses, no thyromegaly Respiratory: clear to auscultation bilaterally, no wheezing, no crackles. Normal respiratory effort. No accessory muscle use.  Cardiovascular: Regular rate and rhythm, no murmurs / rubs / gallops. No extremity edema. 2+ pedal pulses. No carotid bruits.  Abdomen: no tenderness, no masses palpated. No hepatosplenomegaly. Bowel sounds positive.  Musculoskeletal: no clubbing / cyanosis. No joint deformity upper and lower extremities. Good ROM, no contractures. Normal muscle tone.  Skin: no rashes, lesions, ulcers. No induration Neurologic: CN 2-12 grossly intact. Sensation intact, DTR normal. Strength 5/5 in all 4.  Psychiatric: Normal judgment and insight. Alert and oriented x 3. Normal mood.     Labs on Admission: I have personally reviewed following labs and imaging studies  CBC: Recent Labs  Lab 05/16/17 1546 05/16/17 2124  WBC 15.4*  --   HGB 15.6 18.4*  HCT 45.2 54.0*  MCV 90.8  --   PLT 293  --    Basic Metabolic Panel: Recent Labs  Lab 05/16/17 1546 05/16/17 2124  NA 137 138  K 4.4 6.5*  CL 103 107  CO2 11*  --   GLUCOSE 372* 518*  BUN 15 19  CREATININE 1.00 0.80  CALCIUM 9.6  --    GFR: CrCl cannot be calculated (Unknown ideal weight.). Liver Function Tests: Recent Labs  Lab 05/16/17 1546  AST 48*  ALT 47  ALKPHOS 93  BILITOT 1.9*  PROT 8.8*  ALBUMIN 5.2*   Recent Labs  Lab 05/16/17 1546  LIPASE 16   No results for input(s): AMMONIA in the last 168 hours. Coagulation Profile: No results for input(s): INR, PROTIME in the last 168 hours. Cardiac Enzymes: No results for input(s): CKTOTAL, CKMB, CKMBINDEX, TROPONINI in the last 168 hours. BNP (last 3 results) No results for input(s): PROBNP in the last 8760 hours. HbA1C: No results for input(s): HGBA1C in the last 72 hours. CBG: Recent Labs  Lab 05/16/17 1535 05/16/17 2128  GLUCAP 377* 448*   Lipid Profile: No results for input(s): CHOL, HDL,  LDLCALC, TRIG, CHOLHDL, LDLDIRECT in the last 72 hours. Thyroid Function Tests: No results for input(s): TSH, T4TOTAL, FREET4, T3FREE, THYROIDAB in the last 72 hours. Anemia Panel: No results for input(s): VITAMINB12, FOLATE, FERRITIN, TIBC, IRON, RETICCTPCT in the last 72 hours. Urine analysis:    Component Value Date/Time   COLORURINE STRAW (A) 05/16/2017 2205   APPEARANCEUR CLEAR 05/16/2017 2205   LABSPEC 1.017 05/16/2017 2205   PHURINE 5.0 05/16/2017 2205   GLUCOSEU >=500 (A) 05/16/2017 2205   HGBUR SMALL (A) 05/16/2017 2205   BILIRUBINUR NEGATIVE 05/16/2017 2205   KETONESUR 80 (A) 05/16/2017 2205   PROTEINUR 30 (A) 05/16/2017 2205   UROBILINOGEN 0.2 09/24/2012 1037   NITRITE NEGATIVE 05/16/2017 2205   LEUKOCYTESUR NEGATIVE 05/16/2017 2205   Sepsis Labs: No results found for this or any previous visit (from the past 240 hour(s)).   Radiological Exams on Admission: No results found.  EKG: Independently reviewed.  Sinus tachycardia at 119 bpm  Assessment/Plan Diabetic ketoacidosis without coma associated with type 1 diabetes  mellitus: Acute.  Patient presents with blood sugars noted to be initially 372 with anion gap of 23, Venous pH noted to be 7.03, and urinalysis positive for ketones.  Patient was started on glucose stabilizer protocol.  He appears to be in moderate to severe DKA.  Last hemoglobin A1c from 07/2015 was noted to be 9.3. - Admit to stepdown unit  - Glucose stabilizer DKA protocol initiated  - NS IV fluids at 100 ml/hr continuous convert to D5-.45%NS per protocol - Sodium bicarb drip at 100 mL/h x 1 L - Serial BMPs, hemoglobin A1c in a.m.  - Correct electrolytes as needed - Monitoring for AG closure and will transition to subcutaneous insulin once able - Diabetes education - Check UDS  Nausea and vomiting: UA negative for any signs of infection.  Lipase noted to be within normal limits. Question possible gastroenteritis vs alcohol induced vs other. -  Antiemetics as needed  Acute kidney injury secondary to dehydration: Patient presents with a creatinine of 1 and BUN 15 baseline creatinine noted to be 0.5.  Suspect dehydration given patient reports of nausea and vomiting in setting of DKA. - IVFs as seen above - Continue to monitor creatinine  Leukocytosis: Acute.  WBC 15.4 on admission.  Likely related to above. - Recheck CBC in a.m.  Elevated LFTs: AST noted to be mildly elevated at 48 with total bilirubin 1.9.   DVT prophylaxis: lovenox   Code Status: Full Family Communication: none present Disposition Plan:likely discharge home in 2-3 days Consults called:  none Admission status: Inpatient   Clydie Braun MD [facility] Pager [phone]   If 7PM-7AM, please contact night-coverage [email or web address] Password [facility]  05/16/2017, 10:38 PM

## 2017-05-17 ENCOUNTER — Other Ambulatory Visit: Payer: Self-pay

## 2017-05-17 DIAGNOSIS — N179 Acute kidney failure, unspecified: Secondary | ICD-10-CM | POA: Diagnosis present

## 2017-05-17 DIAGNOSIS — K920 Hematemesis: Secondary | ICD-10-CM

## 2017-05-17 DIAGNOSIS — R945 Abnormal results of liver function studies: Secondary | ICD-10-CM

## 2017-05-17 DIAGNOSIS — R7989 Other specified abnormal findings of blood chemistry: Secondary | ICD-10-CM | POA: Diagnosis present

## 2017-05-17 DIAGNOSIS — R112 Nausea with vomiting, unspecified: Secondary | ICD-10-CM | POA: Diagnosis present

## 2017-05-17 LAB — HEMOGLOBIN A1C
Hgb A1c MFr Bld: 13.2 % — ABNORMAL HIGH (ref 4.8–5.6)
Mean Plasma Glucose: 332.14 mg/dL

## 2017-05-17 LAB — BASIC METABOLIC PANEL
ANION GAP: 17 — AB (ref 5–15)
Anion gap: 6 (ref 5–15)
BUN: 15 mg/dL (ref 6–20)
BUN: 12 mg/dL (ref 6–20)
CALCIUM: 9 mg/dL (ref 8.9–10.3)
CHLORIDE: 116 mmol/L — AB (ref 101–111)
CO2: 9 mmol/L — AB (ref 22–32)
CO2: 19 mmol/L — AB (ref 22–32)
CREATININE: 0.73 mg/dL (ref 0.61–1.24)
Calcium: 8.7 mg/dL — ABNORMAL LOW (ref 8.9–10.3)
Chloride: 114 mmol/L — ABNORMAL HIGH (ref 101–111)
Creatinine, Ser: 1.13 mg/dL (ref 0.61–1.24)
GFR calc Af Amer: >60 mL/min (ref >60)
GFR calc non Af Amer: >60 mL/min (ref >60)
GFR calc non Af Amer: >60 mL/min (ref >60)
GLUCOSE: 278 mg/dL — AB (ref 65–99)
Glucose, Bld: 121 mg/dL — ABNORMAL HIGH (ref 65–99)
POTASSIUM: 5.1 mmol/L (ref 3.5–5.1)
Potassium: 3.4 mmol/L — ABNORMAL LOW (ref 3.5–5.1)
Sodium: 142 mmol/L (ref 135–145)
Sodium: 139 mmol/L (ref 135–145)

## 2017-05-17 LAB — CBC
HEMATOCRIT: 40.9 % (ref 39.0–52.0)
HEMOGLOBIN: 14.2 g/dL (ref 13.0–17.0)
MCH: 31.5 pg (ref 26.0–34.0)
MCHC: 34.7 g/dL (ref 30.0–36.0)
MCV: 90.7 fL (ref 78.0–100.0)
Platelets: 292 10*3/uL (ref 150–400)
RBC: 4.51 MIL/uL (ref 4.22–5.81)
RDW: 12.8 % (ref 11.5–15.5)
WBC: 27.4 10*3/uL — ABNORMAL HIGH (ref 4.0–10.5)

## 2017-05-17 LAB — CBG MONITORING, ED
GLUCOSE-CAPILLARY: 115 mg/dL — AB (ref 65–99)
GLUCOSE-CAPILLARY: 120 mg/dL — AB (ref 65–99)
GLUCOSE-CAPILLARY: 161 mg/dL — AB (ref 65–99)
GLUCOSE-CAPILLARY: 193 mg/dL — AB (ref 65–99)
GLUCOSE-CAPILLARY: 214 mg/dL — AB (ref 65–99)
GLUCOSE-CAPILLARY: 300 mg/dL — AB (ref 65–99)
GLUCOSE-CAPILLARY: 86 mg/dL (ref 65–99)
Glucose-Capillary: 242 mg/dL — ABNORMAL HIGH (ref 65–99)
Glucose-Capillary: 195 mg/dL — ABNORMAL HIGH (ref 65–99)
Glucose-Capillary: 152 mg/dL — ABNORMAL HIGH (ref 65–99)
Glucose-Capillary: 178 mg/dL — ABNORMAL HIGH (ref 65–99)
Glucose-Capillary: 134 mg/dL — ABNORMAL HIGH (ref 65–99)
Glucose-Capillary: 126 mg/dL — ABNORMAL HIGH (ref 65–99)
Glucose-Capillary: 126 mg/dL — ABNORMAL HIGH (ref 65–99)

## 2017-05-17 LAB — CBC WITH DIFFERENTIAL/PLATELET
BASOS ABS: 0 10*3/uL (ref 0.0–0.1)
BASOS PCT: 0 %
Eosinophils Absolute: 0 10*3/uL (ref 0.0–0.7)
Eosinophils Relative: 0 %
HEMATOCRIT: 39 % (ref 39.0–52.0)
Hemoglobin: 13.6 g/dL (ref 13.0–17.0)
Lymphocytes Relative: 6 %
Lymphs Abs: 1.3 10*3/uL (ref 0.7–4.0)
MCH: 31 pg (ref 26.0–34.0)
MCHC: 34.9 g/dL (ref 30.0–36.0)
MCV: 88.8 fL (ref 78.0–100.0)
MONO ABS: 0.6 10*3/uL (ref 0.1–1.0)
Monocytes Relative: 3 %
NEUTROS ABS: 18.1 10*3/uL — AB (ref 1.7–7.7)
Neutrophils Relative %: 91 %
PLATELETS: 245 10*3/uL (ref 150–400)
RBC: 4.39 MIL/uL (ref 4.22–5.81)
RDW: 12.9 % (ref 11.5–15.5)
WBC: 20 10*3/uL — ABNORMAL HIGH (ref 4.0–10.5)

## 2017-05-17 LAB — PROTIME-INR
INR: 0.99
Prothrombin Time: 13 seconds (ref 11.4–15.2)

## 2017-05-17 LAB — GLUCOSE, CAPILLARY
GLUCOSE-CAPILLARY: 242 mg/dL — AB (ref 65–99)
GLUCOSE-CAPILLARY: 262 mg/dL — AB (ref 65–99)
GLUCOSE-CAPILLARY: 225 mg/dL — AB (ref 65–99)

## 2017-05-17 LAB — HIV ANTIBODY (ROUTINE TESTING W REFLEX): HIV Screen 4th Generation wRfx: NONREACTIVE

## 2017-05-17 LAB — RAPID URINE DRUG SCREEN, HOSP PERFORMED
AMPHETAMINES: NOT DETECTED
BARBITURATES: NOT DETECTED
Benzodiazepines: NOT DETECTED
COCAINE: NOT DETECTED
Opiates: NOT DETECTED
Tetrahydrocannabinol: NOT DETECTED

## 2017-05-17 MED ORDER — INSULIN ASPART 100 UNIT/ML ~~LOC~~ SOLN
0.0000 [IU] | Freq: Three times a day (TID) | SUBCUTANEOUS | Status: DC
  Administered 2017-05-17: 3 [IU] via SUBCUTANEOUS
  Administered 2017-05-18: 7 [IU] via SUBCUTANEOUS
  Administered 2017-05-18: 5 [IU] via SUBCUTANEOUS
  Administered 2017-05-18: 3 [IU] via SUBCUTANEOUS
  Administered 2017-05-19: 2 [IU] via SUBCUTANEOUS
  Administered 2017-05-19: 5 [IU] via SUBCUTANEOUS
  Administered 2017-05-19 – 2017-05-20 (×2): 2 [IU] via SUBCUTANEOUS
  Administered 2017-05-20: 1 [IU] via SUBCUTANEOUS
  Administered 2017-05-20: 3 [IU] via SUBCUTANEOUS
  Administered 2017-05-21: 1 [IU] via SUBCUTANEOUS
  Administered 2017-05-21: 3 [IU] via SUBCUTANEOUS
  Administered 2017-05-21: 2 [IU] via SUBCUTANEOUS

## 2017-05-17 MED ORDER — SODIUM CHLORIDE 0.9 % IV SOLN
INTRAVENOUS | Status: DC
  Administered 2017-05-17 – 2017-05-19 (×5): via INTRAVENOUS

## 2017-05-17 MED ORDER — SODIUM CHLORIDE 0.9 % IV SOLN
8.0000 mg/h | INTRAVENOUS | Status: DC
  Administered 2017-05-17 – 2017-05-18 (×2): 8 mg/h via INTRAVENOUS
  Filled 2017-05-17 (×4): qty 80

## 2017-05-17 MED ORDER — INSULIN GLARGINE 100 UNIT/ML ~~LOC~~ SOLN
10.0000 [IU] | Freq: Every day | SUBCUTANEOUS | Status: DC
  Administered 2017-05-17: 10 [IU] via SUBCUTANEOUS
  Filled 2017-05-17: qty 0.1

## 2017-05-17 MED ORDER — SODIUM CHLORIDE 0.9 % IV SOLN
80.0000 mg | Freq: Once | INTRAVENOUS | Status: AC
  Administered 2017-05-17: 80 mg via INTRAVENOUS
  Filled 2017-05-17: qty 80

## 2017-05-17 MED ORDER — PANTOPRAZOLE SODIUM 40 MG IV SOLR: 40.0000 mg | Freq: Two times a day (BID) | INTRAVENOUS | Status: DC

## 2017-05-17 MED ORDER — PROMETHAZINE HCL 25 MG/ML IJ SOLN
12.5000 mg | Freq: Four times a day (QID) | INTRAMUSCULAR | Status: DC | PRN
Start: 2017-05-17 — End: 2017-05-19
  Administered 2017-05-17 – 2017-05-19 (×4): 12.5 mg via INTRAVENOUS
  Filled 2017-05-17 (×4): qty 1

## 2017-05-17 NOTE — ED Notes (Signed)
Messaged Dr Sharl MaLama in [place] with recent BMP results and request for basal insulin order to start process of getting of stabilizer. Awaiting new orders.

## 2017-05-17 NOTE — Progress Notes (Signed)
[facility]  PROGRESS NOTE  Shawn BisonDario Maldonado KKX:[id number]:[id number] DOB: [birth year] DOA: 05/16/2017 PCP: [facility], [facility]   Brief HPI:   32 y.o. male with medical history significant of type 1 diabetes; who presents with sudden onset of nausea and vomiting this morning at 7 AM.  Associated symptoms included generalized malaise, diaphoresis, polyuria, increased thirst, and reports wife recently had a 24-hour GI bug 4 days ago.  Denies any significant diarrhea, cough, chest pain, or dysuria. Patient reports having 3 mixed drinks last night, but does not usually drink on daily basis.  He last reported given himself insulin sometime last night.  The patient was last admitted into the hospital for DKA in 07/2015.    Subjective   Patient seen and examined, denies nausea and vomiting. Denies abdominal pain.   Assessment/Plan:     1. Diabetic ketoacidosis-patient presented with anion gap of 23, your positive for ketones. Patient started on DKA protocol. Last hemoglobin's A1c from 2017 was 9.3. Last BMP showed anion gap of 15. Patient is currently on IV insulin. Will recheck BMP. Blood glucose has been stable less than 200 since 5 AM this morning. 2. Nausea and vomiting- even negative for any signs of infection. Lipase normal. Continue antiemetics PRN. 3. Leukocytosis- WBC is 27.4, likely reactive from DKA. No signs of infection. Follow CBC in a.m. 4. Transamenitis, mild-patient has mild elevation of AST 48, total bilirubin 1.9. Will recheck CMP in a.m.     DVT prophylaxis:Lovenox  Code Status: full code   Family Communication: *no family at bedside   Disposition Plan: Home when DKA resolves   Consultants:  none  Procedures:  none  Continuous infusions . sodium chloride Stopped (05/17/17 0433)  . dextrose 5 % and 0.45% NaCl 100 mL/hr at 05/17/17 0153  . insulin (NOVOLIN-R) infusion 3.6 Units/hr (05/17/17 1027)      Antibiotics:   Anti-infectives (From admission, onward)   None       Objective   Vitals:   05/17/17 0930 05/17/17 0940 05/17/17 1000 05/17/17 1044  BP: 121/80 121/80 126/74 120/71  Pulse: (!) 104 (!) 102 (!) 106 100  Resp: 15 18 18 14   Temp:      TempSrc:      SpO2: 100% 100% 100% 100%    Intake/Output Summary (Last 24 hours) at 05/17/2017 1109 Last data filed at 05/17/2017 1002 Gross per 24 hour  Intake 7950 ml  Output -  Net 7950 ml   There were no vitals filed for this visit.   Physical Examination:   Physical Exam: Eyes: No icterus, extraocular muscles intact  Mouth: Oral mucosa is moist, no lesions on palate,  Neck: Supple, no deformities, masses, or tenderness Lungs: Normal respiratory effort, bilateral clear to auscultation, no crackles or wheezes.  Heart: Regular rate and rhythm, S1 and S2 normal, no murmurs, rubs auscultated Abdomen: BS normoactive,soft,nondistended,non-tender to palpation,no organomegaly Extremities: No pretibial edema, no erythema, no cyanosis, no clubbing Neuro : Alert and oriented to time, place and person, No focal deficits     Data Reviewed: I have personally reviewed following labs and imaging studies  CBG: Recent Labs  Lab 05/17/17 0630 05/17/17 0646 05/17/17 0735 05/17/17 0909 05/17/17 1025  GLUCAP 178* 152* 161* 120* 134*    CBC: Recent Labs  Lab 05/16/17 1546 05/16/17 2124 05/17/17 0404  WBC 15.4*  --  27.4*  HGB 15.6 18.4* 14.2  HCT 45.2 54.0* 40.9  MCV 90.8  --  90.7  PLT 293  --  292  Basic Metabolic Panel: Recent Labs  Lab 05/16/17 1546 05/16/17 2124 05/17/17 0131  NA 137 138 142  K 4.4 6.5* 5.1  CL 103 107 116*  CO2 11*  --  9*  GLUCOSE 372* 518* 278*  BUN 15 19 15   CREATININE 1.00 0.80 1.13  CALCIUM 9.6  --  8.7*    No results found for this or any previous visit (from the past 240 hour(s)).   Liver Function Tests: Recent Labs  Lab 05/16/17 1546  AST 48*  ALT 47  ALKPHOS 93  BILITOT 1.9*  PROT 8.8*  ALBUMIN 5.2*   Recent Labs  Lab  05/16/17 1546  LIPASE 16   No results for input(s): AMMONIA in the last 168 hours.     Studies: Dg Chest Port 1 View  Result Date: 05/16/2017 CLINICAL DATA:  Vomiting with hyperglycemia EXAM: PORTABLE CHEST 1 VIEW COMPARISON:  None. FINDINGS: The heart size and mediastinal contours are within normal limits. Both lungs are clear. The visualized skeletal structures are unremarkable. IMPRESSION: No active disease. Electronically Signed   By: Jasmine Pang M.D.   On: 05/16/2017 23:14    Scheduled Meds: . enoxaparin (LOVENOX) injection  40 mg Subcutaneous QHS  . insulin aspart  5 Units Intravenous Once      Time spent: 25 min  Meredeth Ide   [facility] Pager [phone]. If 7PM-7AM, please contact night-coverage at [email or web address], Office  [phone]  password [email or web address]  05/17/2017, 11:09 AM  LOS: 1 day

## 2017-05-17 NOTE — Progress Notes (Signed)
Called by RN that patient vomited bright red blood, no previous history of liver cirrhosis. Patient didn't drink alcohol before coming to the hospital as per patient's wife who spoke to RN.   Will check stat CBC, PT INR  NPO Protonix infusion Phenergan 12.5 mg IV Q6 hours PRN for nausea and vomiting transfer to step down unit  I have called and discussed with mid-level [place], who will evaluate the patient and consult G.I.

## 2017-05-17 NOTE — Significant Event (Signed)
Rapid Response Event Note  Overview: Time Called: 1903 Arrival Time: 1907 Event Type: Other (Comment)   Bedside RN called due to pt "vomiting blood."  Initial Focused Assessment: Neuro: Alert and oriented.  Sitting up in bed. C/O "stomach acid," requesting sips of water, pt had no other complaints. Resp: 100% on R/A Cardiac: HR at baseline SR/ST 90's-100's.  BP stable: 1911: 127/81(93)  1913: 123/79 (91)  Emesis bag assessed with ~50 cc of fluids present.  Fluids appeared to have some blood present, but was mostly bilious.  Interventions: House coverage made aware.  Day shift AC at bedside with RR RN.  VSS, pt on telemetry in room, will remain on telemetry until bed is available in ICU/SD.  Pt will be transferred.  Plan of Care (if not transferred): Pt to be transferred to ICU/SD 1230.   Benard HalstedBrianna N Floreen Teegarden

## 2017-05-17 NOTE — ED Notes (Signed)
Researched patient's chart for orders, have no current orders for repeat BMP and last one drawn at 0130. [place]. Waiting for response.

## 2017-05-17 NOTE — ED Notes (Signed)
Patient still vomiting but unable to have any more Zofran until 1030am.  Patient given ice chips, Biotene mouthrinse and box of tissues.

## 2017-05-17 NOTE — Progress Notes (Signed)
Patient has 1 episode of emesis with bright red blood; output = (fluid and blood); notified MD Lama at the same time.

## 2017-05-17 NOTE — Progress Notes (Addendum)
Inpatient Diabetes Program Recommendations  AACE/ADA: New Consensus Statement on Inpatient Glycemic Control (2015)  Target Ranges:  Prepandial:   less than 140 mg/dL      Peak postprandial:   less than 180 mg/dL (1-2 hours)      Critically ill patients:  140 - 180 mg/dL   Results for Shawn Maldonado, Chapin (MRN [id number]) as of 05/17/2017 08:00  Ref. Range 05/16/2017 15:46  Sodium Latest Ref Range: 135 - 145 mmol/L 137  Potassium Latest Ref Range: 3.5 - 5.1 mmol/L 4.4  Chloride Latest Ref Range: 101 - 111 mmol/L 103  CO2 Latest Ref Range: 22 - 32 mmol/L 11 (L)  Glucose Latest Ref Range: 65 - 99 mg/dL 409372 (H)  BUN Latest Ref Range: 6 - 20 mg/dL 15  Creatinine Latest Ref Range: 0.61 - 1.24 mg/dL 8.111.00  Calcium Latest Ref Range: 8.9 - 10.3 mg/dL 9.6  Anion gap Latest Ref Range: 5 - 15  23 (H)    Admit with: DKA  History: Type 1 DM  Home DM Meds: Levemir 15 units QHS       Novolog 20 units TID  Current Insulin Orders: IV Insulin drip       Note patient currently receiving D5 1/2 NS @ 100cc/hr + IV Insulin drip.  Still down in ED as of 8am this morning.  I have personally counseled this patient about diabetes control during past admission in 2017.  Not sure how he is affording Levemir and Novolog as it is listed that he does Not have insurance?  Plan to speak with patient once he is settled in his room on the unit.     --Will follow patient during hospitalization--  Ambrose FinlandJeannine Johnston Gera Inboden RN, MSN, CDE Diabetes Coordinator Inpatient Glycemic Control Team Team Pager: [phone] (8a-5p)

## 2017-05-17 NOTE — Progress Notes (Signed)
Nightshift MD has been up to assess pt and canceled transfer order. Pt currently resting quietly in bed with eyes closed. Girlfriend and children at bedside. There was been no vomiting since the start of shift. CBC has been drawn, Hgb is at 13.6. Will continue to monitor closely at this time.

## 2017-05-18 LAB — CBC WITH DIFFERENTIAL/PLATELET
BASOS ABS: 0 10*3/uL (ref 0.0–0.1)
BASOS ABS: 0 10*3/uL (ref 0.0–0.1)
BASOS PCT: 0 %
Basophils Relative: 0 %
EOS ABS: 0 10*3/uL (ref 0.0–0.7)
EOS PCT: 0 %
EOS PCT: 0 %
Eosinophils Absolute: 0 10*3/uL (ref 0.0–0.7)
HCT: 38.2 % — ABNORMAL LOW (ref 39.0–52.0)
HCT: 38.8 % — ABNORMAL LOW (ref 39.0–52.0)
Hemoglobin: 13.3 g/dL (ref 13.0–17.0)
Hemoglobin: 13.5 g/dL (ref 13.0–17.0)
LYMPHS PCT: 8 %
Lymphocytes Relative: 7 %
Lymphs Abs: 1.2 10*3/uL (ref 0.7–4.0)
Lymphs Abs: 1.5 10*3/uL (ref 0.7–4.0)
MCH: 31.3 pg (ref 26.0–34.0)
MCH: 30.9 pg (ref 26.0–34.0)
MCHC: 34.8 g/dL (ref 30.0–36.0)
MCHC: 34.8 g/dL (ref 30.0–36.0)
MCV: 89.8 fL (ref 78.0–100.0)
MCV: 88.8 fL (ref 78.0–100.0)
MONO ABS: 0.8 10*3/uL (ref 0.1–1.0)
MONO ABS: 1.1 10*3/uL — AB (ref 0.1–1.0)
Monocytes Relative: 5 %
Monocytes Relative: 6 %
NEUTROS ABS: 14.7 10*3/uL — AB (ref 1.7–7.7)
Neutro Abs: 15.8 10*3/uL — ABNORMAL HIGH (ref 1.7–7.7)
Neutrophils Relative %: 86 %
Neutrophils Relative %: 88 %
PLATELETS: 223 10*3/uL (ref 150–400)
PLATELETS: 239 10*3/uL (ref 150–400)
RBC: 4.3 MIL/uL (ref 4.22–5.81)
RBC: 4.32 MIL/uL (ref 4.22–5.81)
RDW: 13 % (ref 11.5–15.5)
RDW: 13.2 % (ref 11.5–15.5)
WBC: 16.7 10*3/uL — AB (ref 4.0–10.5)
WBC: 18.4 10*3/uL — AB (ref 4.0–10.5)

## 2017-05-18 LAB — GLUCOSE, CAPILLARY
GLUCOSE-CAPILLARY: 342 mg/dL — AB (ref 65–99)
GLUCOSE-CAPILLARY: 298 mg/dL — AB (ref 65–99)
GLUCOSE-CAPILLARY: 217 mg/dL — AB (ref 65–99)
GLUCOSE-CAPILLARY: 224 mg/dL — AB (ref 65–99)

## 2017-05-18 MED ORDER — METOCLOPRAMIDE HCL 5 MG/ML IJ SOLN
10.0000 mg | Freq: Three times a day (TID) | INTRAMUSCULAR | Status: DC
  Administered 2017-05-18 – 2017-05-20 (×7): 10 mg via INTRAVENOUS
  Filled 2017-05-18 (×7): qty 2

## 2017-05-18 MED ORDER — INSULIN GLARGINE 100 UNIT/ML ~~LOC~~ SOLN
20.0000 [IU] | Freq: Every day | SUBCUTANEOUS | Status: DC
  Administered 2017-05-18 – 2017-05-21 (×4): 20 [IU] via SUBCUTANEOUS
  Filled 2017-05-18 (×4): qty 0.2

## 2017-05-18 MED ORDER — POTASSIUM CHLORIDE CRYS ER 20 MEQ PO TBCR
20.0000 meq | EXTENDED_RELEASE_TABLET | Freq: Every day | ORAL | Status: DC
  Administered 2017-05-19 – 2017-05-20 (×2): 20 meq via ORAL
  Filled 2017-05-18 (×2): qty 1

## 2017-05-18 MED ORDER — PANTOPRAZOLE SODIUM 40 MG IV SOLR
40.0000 mg | INTRAVENOUS | Status: DC
  Administered 2017-05-18 – 2017-05-19 (×2): 40 mg via INTRAVENOUS
  Filled 2017-05-18 (×2): qty 40

## 2017-05-18 MED ORDER — MAGIC MOUTHWASH W/LIDOCAINE
10.0000 mL | Freq: Four times a day (QID) | ORAL | Status: DC | PRN
  Administered 2017-05-19: 10 mL via ORAL
  Filled 2017-05-18: qty 10

## 2017-05-18 NOTE — Progress Notes (Signed)
Met with pt this afternoon.  Patient was nauseated and gave me conflicting answers to my questions.  Drowsy likely from the Phenergan he received earlier this afternoon.  I have personally counseled this pt during previous admission (04/30/2015) about the importance of glucose control.  Pt stated to me at the beginning of my assessment that he hasn't seen his PCP in a long time.  States he lost his insurance.  Self-titrating insulin.  States [facility] has been filling his insulin Rxs.  Says he does not skip insulin and has CBG meter at home.  Later in the conversation, pt stated he recently saw his PCP at [facility] about 1 month ago.  Upon chart review, I do not see that patient has been recently seen at [facility] (office visit would be listed in [place]).  Per Care Management, patient has Medicaid and has access to medications.  Not sure which doctor is refilling his insulin Rxs?    --Will follow patient during hospitalization--  Wyn Quaker RN, MSN, CDE Diabetes Coordinator Inpatient Glycemic Control Team Team Pager: [phone] (8a-5p)

## 2017-05-18 NOTE — Progress Notes (Addendum)
Inpatient Diabetes Program Recommendations  AACE/ADA: New Consensus Statement on Inpatient Glycemic Control (2015)  Target Ranges:  Prepandial:   less than 140 mg/dL      Peak postprandial:   less than 180 mg/dL (1-2 hours)      Critically ill patients:  140 - 180 mg/dL   Results for Shawn Maldonado, Shawn Maldonado (MRN [id number]) as of 05/18/2017 07:31  Ref. Range 05/17/2017 13:45 05/17/2017 14:57 05/17/2017 16:44 05/17/2017 18:02 05/17/2017 20:53  Glucose-Capillary Latest Ref Range: 65 - 99 mg/dL 409126 (H)  10 units Lantus given 126 (H) 242 (H) 262 (H) 225 (H)   Results for Shawn Maldonado, Shawn Maldonado (MRN [id number]) as of 05/18/2017 07:31  Ref. Range 05/18/2017 07:28  Glucose-Capillary Latest Ref Range: 65 - 99 mg/dL 956342 (H)   Results for Shawn Maldonado, Shawn Maldonado (MRN [id number]) as of 05/18/2017 07:31  Ref. Range 05/17/2017 04:04  Hemoglobin A1C Latest Ref Range: 4.8 - 5.6 % 13.2 (H)   Home DM Meds: Levemir 15 units QHS                             Novolog 20 units TID  Current Insulin Orders: Lantus 10 units    Novolog Sensitive Correction Scale/ SSI (0-9 units) TID AC      I have personally counseled this patient about diabetes control during past admission in 2017.  Not sure how he is affording Levemir and Novolog as it is listed that he does Not have insurance?    MD- Please consider the following in-hospital insulin adjustments:  1. Increase Lantus to 15 units daily (please give to patient today- received 10 units Lantus yesterday at 1pm to transition off the IV Insulin drip- Will need to have Lantus on board sooner than tonight)  2. Change Novolog SSI to Q4 hour coverage while NPO  3. Please consider checking BMET today      --Will follow patient during hospitalization--  Ambrose FinlandJeannine Johnston Ayjah Show RN, MSN, CDE Diabetes Coordinator Inpatient Glycemic Control Team Team Pager: [phone] (8a-5p)

## 2017-05-18 NOTE — Progress Notes (Signed)
[facility]  PROGRESS NOTE  Shawn BisonDario Maldonado ZOX:[id number] DOB: August 22, 1985 DOA: 05/16/2017 PCP: [facility], [facility]   Brief HPI:   32 y.o. male with medical history significant of type 1 diabetes; who presents with sudden onset of nausea and vomiting this morning at 7 AM.  Associated symptoms included generalized malaise, diaphoresis, polyuria, increased thirst, and reports wife recently had a 24-hour GI bug 4 days ago.  Denies any significant diarrhea, cough, chest pain, or dysuria. Patient reports having 3 mixed drinks last night, but does not usually drink on daily basis.  He last reported given himself insulin sometime last night.  The patient was last admitted into the hospital for DKA in 07/2015. Yesterday patient started vomiting and there was a concern for blood in the vomitus so patient was started on IV Protonix infusion, CBC did not show significant drop in hemoglobin.    Subjective   Patient seen and examined, denies blood in the vomitus. Patient says that he has sores  in the mouth and they may have blood with all the vomiting yesterday.   Assessment/Plan:     1. Diabetic ketoacidosis-resolved patient presented with anion gap of 23, urine  positive for ketones. Patient started on DKA protocol. Last hemoglobin's A1c from 2017 was 9.3. Last BMP showed anion gap of 15. Patient started on Lantus. Clear liquid diet. 2. Diabetes mellitus- blood glucose is elevated, will change Lantus to  20 units sub Q daily. Continue sliding scale insulin with NovoLog. 3. Intractable nausea and vomiting-no hematemesis, likely from gastroparesis. Will start Reglan 10 mg IV Q8 hours. Continue Phenergan PRN.  4. Leukocytosis- WBC was  27.4 on admission, likely reactive from DKA. No signs of infection. Repeat CBC showed WBC of 16.7. 5. Transamenitis, mild-patient has mild elevation of AST 48, total bilirubin 1.9. Will recheck CMP in a.m. 6. Oral lesions- ?? Cause,  Likely from vomiting, will start  magic mouthwash 10 ml  4 times a day.     DVT prophylaxis:Lovenox  Code Status: full code   Family Communication: *no family at bedside   Disposition Plan: Home when DKA resolves   Consultants:  none  Procedures:  none  Continuous infusions . sodium chloride 100 mL/hr at 05/17/17 1806  . sodium chloride 75 mL/hr at 05/18/17 1232      Antibiotics:   Anti-infectives (From admission, onward)   None       Objective   Vitals:   05/17/17 2049 05/18/17 0024 05/18/17 0429 05/18/17 1417  BP: (!) 146/73 133/75 130/81 123/84  Pulse: (!) 107 (!) 108 92 98  Resp: 18 16 16 18   Temp: 98.2 F (36.8 C) 99 F (37.2 C) 98.5 F (36.9 C) 98.2 F (36.8 C)  TempSrc: Oral Oral Oral Oral  SpO2: 100% 100% 100% 100%  Weight:      Height:        Intake/Output Summary (Last 24 hours) at 05/18/2017 1457 Last data filed at 05/18/2017 1434 Gross per 24 hour  Intake 2982.02 ml  Output 1200 ml  Net 1782.02 ml   Filed Weights   05/17/17 1739  Weight: 56.7 kg (125 lb)     Physical Examination:   Physical Exam: Eyes: No icterus, extraocular muscles intact  Mouth: Oral mucosa is moist, mild erythematous sports noted on the soft palate. Neck: Supple, no deformities, masses, or tenderness Lungs: Normal respiratory effort, bilateral clear to auscultation, no crackles or wheezes.  Heart: Regular rate and rhythm, S1 and S2 normal, no murmurs, rubs auscultated Abdomen:  BS normoactive,soft,nondistended,non-tender to palpation,no organomegaly Extremities: No pretibial edema, no erythema, no cyanosis, no clubbing Neuro : Alert and oriented to time, place and person, No focal deficits      Data Reviewed: I have personally reviewed following labs and imaging studies  CBG: Recent Labs  Lab 05/17/17 1644 05/17/17 1802 05/17/17 2053 05/18/17 0728 05/18/17 1101  GLUCAP 242* 262* 225* 342* 298*    CBC: Recent Labs  Lab 05/16/17 1546 05/16/17 2124 05/17/17 0404  05/17/17 1854 05/18/17 0039 05/18/17 0638  WBC 15.4*  --  27.4* 20.0* 18.4* 16.7*  NEUTROABS  --   --   --  18.1* 15.8* 14.7*  HGB 15.6 18.4* 14.2 13.6 13.3 13.5  HCT 45.2 54.0* 40.9 39.0 38.2* 38.8*  MCV 90.8  --  90.7 88.8 88.8 89.8  PLT 293  --  292 245 239 223    Basic Metabolic Panel: Recent Labs  Lab 05/16/17 1546 05/16/17 2124 05/17/17 0131 05/17/17 1122  NA 137 138 142 139  K 4.4 6.5* 5.1 3.4*  CL 103 107 116* 114*  CO2 11*  --  9* 19*  GLUCOSE 372* 518* 278* 121*  BUN 15 19 15 12   CREATININE 1.00 0.80 1.13 0.73  CALCIUM 9.6  --  8.7* 9.0    No results found for this or any previous visit (from the past 240 hour(s)).   Liver Function Tests: Recent Labs  Lab 05/16/17 1546  AST 48*  ALT 47  ALKPHOS 93  BILITOT 1.9*  PROT 8.8*  ALBUMIN 5.2*   Recent Labs  Lab 05/16/17 1546  LIPASE 16   No results for input(s): AMMONIA in the last 168 hours.     Studies: Dg Chest Port 1 View  Result Date: 05/16/2017 CLINICAL DATA:  Vomiting with hyperglycemia EXAM: PORTABLE CHEST 1 VIEW COMPARISON:  None. FINDINGS: The heart size and mediastinal contours are within normal limits. Both lungs are clear. The visualized skeletal structures are unremarkable. IMPRESSION: No active disease. Electronically Signed   By: Jasmine Pang M.D.   On: 05/16/2017 23:14    Scheduled Meds: . enoxaparin (LOVENOX) injection  40 mg Subcutaneous QHS  . insulin aspart  0-9 Units Subcutaneous TID WC  . insulin aspart  5 Units Intravenous Once  . insulin glargine  20 Units Subcutaneous Daily  . metoCLOPramide (REGLAN) injection  10 mg Intravenous Q8H      Time spent: 25 min  Meredeth Ide   [facility] Pager [phone]. If 7PM-7AM, please contact night-coverage at [email or web address], Office  [phone]  password [email or web address]  05/18/2017, 2:57 PM  LOS: 1 day

## 2017-05-18 NOTE — Care Management Note (Signed)
Case Management Note  Patient Details  Name: Sheryn BisonDario Genao MRN: [id number] Date of Birth: 1986-03-26  Subjective/Objective:                  Diabetic ketoacidosis  Action/Plan: Date: May 18, 2017 Marcelle SmilingRhonda Davis, BSN, SnyderRN3, [place] [phone] Chart and notes review for patient progress and needs. Will follow for case management and discharge needs. Will establish if patient has insurance and if so can he afford his medications and has a pcp Next review date: [id number]  Expected Discharge Date:  (unknown)               Expected Discharge Plan:  Home/Self Care  In-House Referral:     Discharge planning Services  CM Consult  Post Acute Care Choice:    Choice offered to:     DME Arranged:    DME Agency:     HH Arranged:    HH Agency:     Status of Service:  In process, will continue to follow  If discussed at Long Length of Stay Meetings, dates discussed:    Additional Comments:  Golda AcreDavis, Rhonda Lynn, RN 05/18/2017, 8:49 AM

## 2017-05-18 NOTE — Progress Notes (Signed)
MD paged regarding patient's vomiting episode. Patient given phenergan. Orders received to administer Reglan. Continue patient on clear liquid diet.

## 2017-05-19 DIAGNOSIS — K3184 Gastroparesis: Secondary | ICD-10-CM

## 2017-05-19 DIAGNOSIS — E1143 Type 2 diabetes mellitus with diabetic autonomic (poly)neuropathy: Secondary | ICD-10-CM

## 2017-05-19 DIAGNOSIS — E876 Hypokalemia: Secondary | ICD-10-CM

## 2017-05-19 LAB — GLUCOSE, CAPILLARY
Glucose-Capillary: 285 mg/dL — ABNORMAL HIGH (ref 65–99)
Glucose-Capillary: 202 mg/dL — ABNORMAL HIGH (ref 65–99)
Glucose-Capillary: 167 mg/dL — ABNORMAL HIGH (ref 65–99)

## 2017-05-19 LAB — COMPREHENSIVE METABOLIC PANEL
ALBUMIN: 3.9 g/dL (ref 3.5–5.0)
ALK PHOS: 63 U/L (ref 38–126)
ALT: 27 U/L (ref 17–63)
AST: 30 U/L (ref 15–41)
Anion gap: 13 (ref 5–15)
BILIRUBIN TOTAL: 2.7 mg/dL — AB (ref 0.3–1.2)
BUN: 11 mg/dL (ref 6–20)
CALCIUM: 9.1 mg/dL (ref 8.9–10.3)
CO2: 17 mmol/L — AB (ref 22–32)
CREATININE: 0.75 mg/dL (ref 0.61–1.24)
Chloride: 109 mmol/L (ref 101–111)
GFR calc Af Amer: >60 mL/min (ref >60)
GFR calc non Af Amer: >60 mL/min (ref >60)
GLUCOSE: 272 mg/dL — AB (ref 65–99)
Potassium: 3.8 mmol/L (ref 3.5–5.1)
SODIUM: 139 mmol/L (ref 135–145)
TOTAL PROTEIN: 7 g/dL (ref 6.5–8.1)

## 2017-05-19 LAB — CBC
HEMATOCRIT: 39.6 % (ref 39.0–52.0)
HEMOGLOBIN: 13.9 g/dL (ref 13.0–17.0)
MCH: 31.4 pg (ref 26.0–34.0)
MCHC: 35.1 g/dL (ref 30.0–36.0)
MCV: 89.4 fL (ref 78.0–100.0)
Platelets: 235 10*3/uL (ref 150–400)
RBC: 4.43 MIL/uL (ref 4.22–5.81)
RDW: 13.2 % (ref 11.5–15.5)
WBC: 13.3 10*3/uL — AB (ref 4.0–10.5)

## 2017-05-19 MED ORDER — PROMETHAZINE HCL 25 MG/ML IJ SOLN
6.2500 mg | Freq: Four times a day (QID) | INTRAMUSCULAR | Status: DC | PRN
  Administered 2017-05-19 – 2017-05-20 (×3): 6.25 mg via INTRAVENOUS
  Filled 2017-05-19 (×3): qty 1

## 2017-05-19 NOTE — Progress Notes (Addendum)
[facility]  PROGRESS NOTE  Shawn Maldonado ZOX:[id number]:[id number] DOB: January 12, 1986 DOA: 05/16/2017 PCP: [facility]   Brief HPI:   32 y.o. male with medical history significant of type 1 diabetes; who presents with sudden onset of nausea and vomiting this morning at 7 AM.  Associated symptoms included generalized malaise, diaphoresis, polyuria, increased thirst, and reports wife recently had a 24-hour GI bug 4 days ago.  Denies any significant diarrhea, cough, chest pain, or dysuria. Patient reports having 3 mixed drinks last night, but does not usually drink on daily basis.  He last reported given himself insulin sometime last night.  The patient was last admitted into the hospital for DKA in 07/2015. Yesterday patient started vomiting and there was a concern for blood in the vomitus so patient was started on IV Protonix infusion, CBC did not show significant drop in hemoglobin.    Subjective   Afebrile, no CP, no SOB. Patient struggling to keep thins down in setting of acute gastroparesis most likely.   Assessment/Plan:     1. Diabetic ketoacidosis-resolved patient's DKA resolved. Continue current hypoglycemic regimen.  2. Diabetes mellitus- continue SSI and continue Lantus for now. 3. Intractable nausea and vomiting associated with gastroparesis. Continue slowly advancign diet and the use of reglan, PPI and PRN antiemetics.  4. Leukocytosis- WBC was  27.4 on admission, likely reactive in setting of acute reactive elevated WBC's. Continue monitoring off antibiotics.  5. Transamenitis, mild-patient has mild elevation of AST 48, total bilirubin 1.9. Most likely associated with DKA, dehydration and recent alcohol abuse. Will follow LFT's. 6. Oral lesions- continue oral mouth wash; no thrush.  7. Alcohol use: cessation counseling provided. No hx of withdrawal  8. Hyperkalemia/hypokalemia: associated with DKA and then GI loses and use of insulin. Electrolytes stable currently, will  monitor trend.    DVT prophylaxis:Lovenox  Code Status: full code   Family Communication: no family at bedside   Disposition Plan: Home once able to tolerate PO's and CBG's remains stable.    Consultants:  none  Procedures:  none  Continuous infusions . sodium chloride 100 mL/hr at 05/17/17 1806  . sodium chloride 75 mL/hr at 05/19/17 1533      Antibiotics:   Anti-infectives (From admission, onward)   None       Objective   Vitals:   05/18/17 2054 05/18/17 2348 05/19/17 0431 05/19/17 1410  BP: 139/89 136/79 128/88 105/63  Pulse: 68 71 71 82  Resp: 18 18 18 18   Temp: 98.4 F (36.9 C) 99.1 F (37.3 C) 98.7 F (37.1 C) 97.6 F (36.4 C)  TempSrc: Oral Oral Oral Oral  SpO2: 100% 99% 100% 97%  Weight:      Height:        Intake/Output Summary (Last 24 hours) at 05/19/2017 1802 Last data filed at 05/19/2017 1400 Gross per 24 hour  Intake 1671.25 ml  Output 1500 ml  Net 171.25 ml   Filed Weights   05/17/17 1739  Weight: 56.7 kg (125 lb)     Physical Examination:   General Exam: afebrile, no CP, no SOB. Still reporting dizziness and nausea. But so far has tolerated Lunch. Lungs:CTA bilaterally.  Heart: mild tachycardia, no rubs, no gallops. Abdomen: positive BS, diffuse vague discomfort, no guarding, positive BS. Extremities: no edema, no cyanosis  Neuro :AAOX3, CN intact, no focal deficit appreciated.   Data Reviewed: I have personally reviewed following labs and imaging studies  CBG: Recent Labs  Lab 05/18/17 1635 05/18/17 2058 05/19/17 0741 05/19/17  1218 05/19/17 1634  GLUCAP 217* 224* 285* 202* 167*    CBC: Recent Labs  Lab 05/17/17 0404 05/17/17 1854 05/18/17 0039 05/18/17 0638 05/19/17 0711  WBC 27.4* 20.0* 18.4* 16.7* 13.3*  NEUTROABS  --  18.1* 15.8* 14.7*  --   HGB 14.2 13.6 13.3 13.5 13.9  HCT 40.9 39.0 38.2* 38.8* 39.6  MCV 90.7 88.8 88.8 89.8 89.4  PLT 292 245 239 223 235    Basic Metabolic Panel: Recent Labs   Lab 05/16/17 1546 05/16/17 2124 05/17/17 0131 05/17/17 1122 05/19/17 0711  NA 137 138 142 139 139  K 4.4 6.5* 5.1 3.4* 3.8  CL 103 107 116* 114* 109  CO2 11*  --  9* 19* 17*  GLUCOSE 372* 518* 278* 121* 272*  BUN 15 19 15 12 11   CREATININE 1.00 0.80 1.13 0.73 0.75  CALCIUM 9.6  --  8.7* 9.0 9.1    Liver Function Tests: Recent Labs  Lab 05/16/17 1546 05/19/17 0711  AST 48* 30  ALT 47 27  ALKPHOS 93 63  BILITOT 1.9* 2.7*  PROT 8.8* 7.0  ALBUMIN 5.2* 3.9   Recent Labs  Lab 05/16/17 1546  LIPASE 16    Studies: No results found.  Scheduled Meds: . enoxaparin (LOVENOX) injection  40 mg Subcutaneous QHS  . insulin aspart  0-9 Units Subcutaneous TID WC  . insulin aspart  5 Units Intravenous Once  . insulin glargine  20 Units Subcutaneous Daily  . metoCLOPramide (REGLAN) injection  10 mg Intravenous Q8H  . pantoprazole (PROTONIX) IV  40 mg Intravenous Q24H  . potassium chloride  20 mEq Oral Daily      Time spent: 25 min  Vassie Loll MD   [facility] Pager [phone] If 7PM-7AM, please contact night-coverage at [email or web address], password [facility]  05/19/2017, 6:02 PM  LOS: 2 days

## 2017-05-20 LAB — BASIC METABOLIC PANEL
Anion gap: 6 (ref 5–15)
BUN: 8 mg/dL (ref 6–20)
CALCIUM: 7.4 mg/dL — AB (ref 8.9–10.3)
CHLORIDE: 109 mmol/L (ref 101–111)
CO2: 22 mmol/L (ref 22–32)
CREATININE: 0.48 mg/dL — AB (ref 0.61–1.24)
GFR calc Af Amer: >60 mL/min (ref >60)
GFR calc non Af Amer: >60 mL/min (ref >60)
Glucose, Bld: 211 mg/dL — ABNORMAL HIGH (ref 65–99)
Potassium: 2.8 mmol/L — ABNORMAL LOW (ref 3.5–5.1)
SODIUM: 137 mmol/L (ref 135–145)

## 2017-05-20 LAB — GLUCOSE, CAPILLARY
GLUCOSE-CAPILLARY: 151 mg/dL — AB (ref 65–99)
Glucose-Capillary: 213 mg/dL — ABNORMAL HIGH (ref 65–99)
Glucose-Capillary: 141 mg/dL — ABNORMAL HIGH (ref 65–99)
Glucose-Capillary: 163 mg/dL — ABNORMAL HIGH (ref 65–99)

## 2017-05-20 MED ORDER — PANTOPRAZOLE SODIUM 40 MG PO TBEC
40.0000 mg | DELAYED_RELEASE_TABLET | Freq: Two times a day (BID) | ORAL | Status: DC
  Administered 2017-05-20 – 2017-05-21 (×2): 40 mg via ORAL
  Filled 2017-05-20 (×2): qty 1

## 2017-05-20 MED ORDER — POTASSIUM CHLORIDE IN NACL 40-0.9 MEQ/L-% IV SOLN
INTRAVENOUS | Status: DC
  Administered 2017-05-20 – 2017-05-21 (×2): 100 mL/h via INTRAVENOUS
  Filled 2017-05-20 (×2): qty 1000

## 2017-05-20 MED ORDER — METOCLOPRAMIDE HCL 5 MG PO TABS
5.0000 mg | ORAL_TABLET | Freq: Three times a day (TID) | ORAL | Status: DC
  Administered 2017-05-20 – 2017-05-21 (×5): 5 mg via ORAL
  Filled 2017-05-20 (×5): qty 1

## 2017-05-20 MED ORDER — SUCRALFATE 1 GM/10ML PO SUSP
1.0000 g | Freq: Three times a day (TID) | ORAL | Status: DC
  Administered 2017-05-20 – 2017-05-21 (×4): 1 g via ORAL
  Filled 2017-05-20 (×4): qty 10

## 2017-05-20 MED ORDER — SODIUM CHLORIDE 0.9 % IV SOLN
INTRAVENOUS | Status: DC
  Filled 2017-05-20: qty 1000

## 2017-05-20 MED ORDER — POTASSIUM CHLORIDE 10 MEQ/100ML IV SOLN
10.0000 meq | INTRAVENOUS | Status: AC
  Administered 2017-05-20 (×2): 10 meq via INTRAVENOUS
  Filled 2017-05-20 (×2): qty 100

## 2017-05-20 MED ORDER — POTASSIUM CHLORIDE CRYS ER 20 MEQ PO TBCR
40.0000 meq | EXTENDED_RELEASE_TABLET | ORAL | Status: DC
  Administered 2017-05-20: 40 meq via ORAL
  Filled 2017-05-20: qty 2

## 2017-05-20 MED ORDER — MAGNESIUM SULFATE 2 GM/50ML IV SOLN
2.0000 g | Freq: Once | INTRAVENOUS | Status: AC
  Administered 2017-05-20: 2 g via INTRAVENOUS
  Filled 2017-05-20: qty 50

## 2017-05-20 NOTE — Progress Notes (Signed)
[facility]  PROGRESS NOTE  Sheryn BisonDario Genao ZHY:[id number] DOB: [birth year] DOA: 05/16/2017 PCP: [facility]   Brief HPI:   32 y.o. male with medical history significant of type 1 diabetes; who presents with sudden onset of nausea and vomiting this morning at 7 AM.  Associated symptoms included generalized malaise, diaphoresis, polyuria, increased thirst, and reports wife recently had a 24-hour GI bug 4 days ago.  Denies any significant diarrhea, cough, chest pain, or dysuria. Patient reports having 3 mixed drinks last night, but does not usually drink on daily basis.  He last reported given himself insulin sometime last night.  The patient was last admitted into the hospital for DKA in 07/2015. Yesterday patient started vomiting and there was a concern for blood in the vomitus so patient was started on IV Protonix infusion, CBC did not show significant drop in hemoglobin.    Subjective  No fever, no chest pain, no shortness of breath.  Patient is still struggling keeping things down secondary to gastroparesis symptoms.  No abdominal pain, no dysuria, no other acute complaints.   Assessment/Plan:    1. Diabetic ketoacidosis-resolved patient's DKA resolved. Continue current hypoglycemic regimen. Will need close follow up as an outpatient for further control of his diabetes.  2. Diabetes mellitus- continue SSI and continue Lantus for now. A1C 13.2 3. Intractable nausea and vomiting associated with gastroparesis and GERD. Continue slowly advancign diet and the use of reglan, PPI and PRN antiemetics. Small multiple meals approach discussed with patient.  4. Leukocytosis- WBC was  27.4 on admission, likely reactive in setting of acute DKA. Continue monitoring off antibiotics, no source of infection appreciated; WBC's trending down with supportive care and proper hydration. 5. Transamenitis, mild-patient has mild elevation of AST 48, total bilirubin 1.9. Most likely associated with DKA,  dehydration and recent alcohol abuse. Will follow LFT's in am.. 6. Oral lesions/esophagitis- continue oral mouth wash; no thrush.  Continue PPI twice a day and the use of Carafate. 7. Alcohol use: Cessation counseling has been provided.  No past history of withdrawal and no having any signs or symptoms of withdrawal currently either. 8. Hyperkalemia/hypokalemia: associated with DKA initially and then GI loses and use of insulin.  We will continue further repletion and follow electrolytes trend.  We will also check magnesium and replete as needed.      DVT prophylaxis:Lovenox  Code Status: full code   Family Communication: no family at bedside   Disposition Plan: Home once able to tolerate PO's; case manager to assist with medications and [facility].    Consultants:  none  Procedures:  none  Continuous infusions . sodium chloride 100 mL/hr at 05/17/17 1806  . sodium chloride 75 mL/hr at 05/19/17 1533  . magnesium sulfate 1 - 4 g bolus IVPB        Antibiotics:   Anti-infectives (From admission, onward)   None      Objective   Vitals:   05/19/17 2006 05/20/17 0011 05/20/17 0427 05/20/17 1514  BP: 129/72 (!) 141/83 131/83 126/73  Pulse: 61 64 69 68  Resp: 18 18 18 18   Temp: 98.7 F (37.1 C) 98.3 F (36.8 C) 97.9 F (36.6 C) 98.5 F (36.9 C)  TempSrc: Oral Oral Oral Oral  SpO2: 100% 100% 100% 100%  Weight:      Height:        Intake/Output Summary (Last 24 hours) at 05/20/2017 1657 Last data filed at 05/19/2017 1800 Gross per 24 hour  Intake 300 ml  Output -  Net 300 ml   Filed Weights   05/17/17 1739  Weight: 56.7 kg (125 lb)     Physical Examination:  General Exam: No fever, no chest pain, no shortness of breath.  Patient reports no further dizziness.  Still having nausea/vomiting and difficulty eating.  Even he is feeling hungry and will like to try full liquid diet. Lungs: Good air movement bilaterally, no wheezing, no  crackles, normal respiratory effort.  Heart: S1 and S2, no rubs, no gallops, no murmurs.  Abdomen: Soft, nontender, nondistended, positive bowel sounds; no guarding Extremities: No edema, no cyanosis, no clubbing. Neuro : Alert, awake and oriented x3, cranial nerves grossly intact, no focal neurologic deficits appreciated on exam.    Data Reviewed: I have personally reviewed following labs and imaging studies  CBG: Recent Labs  Lab 05/19/17 1218 05/19/17 1634 05/20/17 0740 05/20/17 1150 05/20/17 1640  GLUCAP 202* 167* 213* 141* 151*    CBC: Recent Labs  Lab 05/17/17 0404 05/17/17 1854 05/18/17 0039 05/18/17 0638 05/19/17 0711  WBC 27.4* 20.0* 18.4* 16.7* 13.3*  NEUTROABS  --  18.1* 15.8* 14.7*  --   HGB 14.2 13.6 13.3 13.5 13.9  HCT 40.9 39.0 38.2* 38.8* 39.6  MCV 90.7 88.8 88.8 89.8 89.4  PLT 292 245 239 223 235    Basic Metabolic Panel: Recent Labs  Lab 05/16/17 1546 05/16/17 2124 05/17/17 0131 05/17/17 1122 05/19/17 0711 05/20/17 0541  NA 137 138 142 139 139 137  K 4.4 6.5* 5.1 3.4* 3.8 2.8*  CL 103 107 116* 114* 109 109  CO2 11*  --  9* 19* 17* 22  GLUCOSE 372* 518* 278* 121* 272* 211*  BUN 15 19 15 12 11 8   CREATININE 1.00 0.80 1.13 0.73 0.75 0.48*  CALCIUM 9.6  --  8.7* 9.0 9.1 7.4*    Liver Function Tests: Recent Labs  Lab 05/16/17 1546 05/19/17 0711  AST 48* 30  ALT 47 27  ALKPHOS 93 63  BILITOT 1.9* 2.7*  PROT 8.8* 7.0  ALBUMIN 5.2* 3.9   Recent Labs  Lab 05/16/17 1546  LIPASE 16    Studies: No results found.  Scheduled Meds: . enoxaparin (LOVENOX) injection  40 mg Subcutaneous QHS  . insulin aspart  0-9 Units Subcutaneous TID WC  . insulin aspart  5 Units Intravenous Once  . insulin glargine  20 Units Subcutaneous Daily  . metoCLOPramide  5 mg Oral TID AC & HS  . pantoprazole  40 mg Oral BID  . potassium chloride  40 mEq Oral Q4H  . sucralfate  1 g Oral TID      Time spent: 25 min  Vassie Loll MD   [facility] Pager [phone] If 7PM-7AM, please contact night-coverage at [email or web address], password [facility]  05/20/2017, 4:57 PM  LOS: 3 days

## 2017-05-20 NOTE — Progress Notes (Signed)
Patient vomited after immediately after swallowing PO K+CL.  Dr. Gwenlyn PerkingMadera notified via text page.

## 2017-05-21 DIAGNOSIS — R740 Nonspecific elevation of levels of transaminase and lactic acid dehydrogenase [LDH]: Secondary | ICD-10-CM

## 2017-05-21 DIAGNOSIS — K3184 Gastroparesis: Secondary | ICD-10-CM

## 2017-05-21 DIAGNOSIS — D72829 Elevated white blood cell count, unspecified: Secondary | ICD-10-CM

## 2017-05-21 DIAGNOSIS — R7401 Elevation of levels of liver transaminase levels: Secondary | ICD-10-CM | POA: Diagnosis present

## 2017-05-21 DIAGNOSIS — E1143 Type 2 diabetes mellitus with diabetic autonomic (poly)neuropathy: Secondary | ICD-10-CM | POA: Diagnosis present

## 2017-05-21 LAB — COMPREHENSIVE METABOLIC PANEL
ALT: 19 U/L (ref 17–63)
ANION GAP: 6 (ref 5–15)
AST: 17 U/L (ref 15–41)
Albumin: 3.3 g/dL — ABNORMAL LOW (ref 3.5–5.0)
Alkaline Phosphatase: 50 U/L (ref 38–126)
BUN: 6 mg/dL (ref 6–20)
CHLORIDE: 105 mmol/L (ref 101–111)
CO2: 26 mmol/L (ref 22–32)
Calcium: 8.4 mg/dL — ABNORMAL LOW (ref 8.9–10.3)
Creatinine, Ser: 0.48 mg/dL — ABNORMAL LOW (ref 0.61–1.24)
Glucose, Bld: 133 mg/dL — ABNORMAL HIGH (ref 65–99)
Potassium: 3.3 mmol/L — ABNORMAL LOW (ref 3.5–5.1)
Sodium: 137 mmol/L (ref 135–145)
Total Bilirubin: 1.5 mg/dL — ABNORMAL HIGH (ref 0.3–1.2)
Total Protein: 5.8 g/dL — ABNORMAL LOW (ref 6.5–8.1)

## 2017-05-21 LAB — GLUCOSE, CAPILLARY
GLUCOSE-CAPILLARY: 136 mg/dL — AB (ref 65–99)
GLUCOSE-CAPILLARY: 195 mg/dL — AB (ref 65–99)
Glucose-Capillary: 223 mg/dL — ABNORMAL HIGH (ref 65–99)

## 2017-05-21 LAB — MAGNESIUM: MAGNESIUM: 1.8 mg/dL (ref 1.7–2.4)

## 2017-05-21 LAB — CBC
HCT: 37.8 % — ABNORMAL LOW (ref 39.0–52.0)
Hemoglobin: 13.2 g/dL (ref 13.0–17.0)
MCH: 30.8 pg (ref 26.0–34.0)
MCHC: 34.9 g/dL (ref 30.0–36.0)
MCV: 88.3 fL (ref 78.0–100.0)
PLATELETS: 186 10*3/uL (ref 150–400)
RBC: 4.28 MIL/uL (ref 4.22–5.81)
RDW: 12.5 % (ref 11.5–15.5)
WBC: 7.6 10*3/uL (ref 4.0–10.5)

## 2017-05-21 MED ORDER — POTASSIUM CHLORIDE CRYS ER 20 MEQ PO TBCR
40.0000 meq | EXTENDED_RELEASE_TABLET | Freq: Once | ORAL | Status: AC
  Administered 2017-05-21: 40 meq via ORAL
  Filled 2017-05-21: qty 2

## 2017-05-21 MED ORDER — POTASSIUM CHLORIDE CRYS ER 20 MEQ PO TBCR: 40.0000 meq | EXTENDED_RELEASE_TABLET | Freq: Once | ORAL | Status: DC

## 2017-05-21 MED ORDER — INSULIN DETEMIR 100 UNIT/ML ~~LOC~~ SOLN: 20.0000 [IU] | Freq: Every day | SUBCUTANEOUS | 0 refills | Status: DC

## 2017-05-21 MED ORDER — SUCRALFATE 1 GM/10ML PO SUSP: 1.0000 g | Freq: Three times a day (TID) | ORAL | 0 refills | Status: DC

## 2017-05-21 MED ORDER — METOCLOPRAMIDE HCL 5 MG PO TABS: 5.0000 mg | ORAL_TABLET | Freq: Three times a day (TID) | ORAL | 0 refills | Status: DC

## 2017-05-21 MED ORDER — MAGIC MOUTHWASH W/LIDOCAINE: 10.0000 mL | Freq: Four times a day (QID) | ORAL | 0 refills | Status: DC | PRN

## 2017-05-21 MED ORDER — POTASSIUM CHLORIDE 10 MEQ/100ML IV SOLN
10.0000 meq | INTRAVENOUS | Status: AC
  Administered 2017-05-21: 10 meq via INTRAVENOUS
  Filled 2017-05-21 (×4): qty 100

## 2017-05-21 MED ORDER — MAGNESIUM SULFATE 4 GM/100ML IV SOLN
4.0000 g | Freq: Once | INTRAVENOUS | Status: AC
  Administered 2017-05-21: 4 g via INTRAVENOUS
  Filled 2017-05-21: qty 100

## 2017-05-21 MED ORDER — PANTOPRAZOLE SODIUM 40 MG PO TBEC: 40.0000 mg | DELAYED_RELEASE_TABLET | Freq: Two times a day (BID) | ORAL | 1 refills | Status: DC

## 2017-05-21 NOTE — Discharge Summary (Signed)
Physician Discharge Summary  Shawn Maldonado:[id number] DOB: 07-10-1985 DOA: 05/16/2017  PCP: [facility]  Admit date: 05/16/2017 Discharge date: 05/21/2017  Time spent: 60 minutes  Recommendations for Outpatient Follow-up:  1. Follow-up with PCP in 1-2 weeks.  On follow-up patient will need a comprehensive metabolic profile done as well as a magnesium level to follow-up on electrolytes, liver enzymes and renal function.  Patient's diabetes will need to be reassessed with further management of his diabetes.  Patient's esophagitis will need to be followed up upon.   Discharge Diagnoses:  Principal Problem:   Diabetic ketoacidosis without coma associated with type 1 diabetes mellitus (HCC) Active Problems:   DKA (diabetic ketoacidoses) (HCC)   Hypokalemia   DKA, type 1 (HCC)   Nausea & vomiting   AKI (acute kidney injury) (HCC)   Elevated LFTs   Diabetic gastroparesis (HCC)   Leukocytosis   Transaminitis   Discharge Condition: Stable and improved.  Diet recommendation: Carb modified diet.  Filed Weights   05/17/17 1739  Weight: 56.7 kg (125 lb)    History of present illness:  Per Dr Bonnell Public is a 32 y.o. male with medical history significant of type 1 diabetes; who presented with sudden onset of nausea and vomiting on the morning of admission at 7 AM.  Associated symptoms included generalized malaise, diaphoresis, polyuria, increased thirst, and reported wife recently had a 24-hour GI bug 4 days ago.  Denied any significant diarrhea, cough, chest pain, or dysuria. Patient reported having 3 mixed drinks last night, but did not usually drink on daily basis.  He last reported given himself insulin sometime last night.  The patient was last admitted into the hospital for DKA in 07/2015.  ED Course: Upon admission to the emergency department patient was found to be afebrile, pulse 104-128, respirations 18-24, blood pressure 138/92- 159/71, and o2 sats maintained on  room air.  Labs revealed CBG 372 with anion gap of 23, sodium bicarb initially 11, and venous pH noted to be 7.03. urinalysis revealed signs of ketones and glucose but no signs of infection.  Patient was ordered to be given 3 L of normal saline IV fluids, 5 units of insulin and to be started on glucose stabilizer protocol. Repeat i-STAT labs revealed CO2   9, potassium 6.5, and glucose 518.  [facility] called to admit   Hospital Course:  1. Diabetic ketoacidosis-patient was admitted in DKA with a anion gap of 23.  Patient noted to have a pH of 7.03 a urinalysis with ketones and glucose.  Patient was hydrated aggressively with IV fluids and patient started a glucose stabilizer.  Serial BMETs were obtained and patient was followed.  Had no signs or symptoms of infection.  Anion gap closed and patient was subsequently transitioned to subcutaneous insulin.  Patient's diet was slowly advanced which he tolerated.  CBGs were followed and monitored.  Hemoglobin A1c which was done was 13.2 on May 17, 2017.  Patient improved clinically and patient's Lantus dose was adjusted to 20 units daily.  Patient will be discharged home on his long-acting insulin at 20 units daily as well as sliding scale insulin with meals that he was on prior to admission.  Patient will follow-up with PCP in the outpatient setting for further control of his diabetes. 2. Diabetes mellitus- continue SSI and continue Lantus for now. A1C 13.2 3. Intractable nausea and vomiting associated with gastroparesis and GERD.  Patient was noted to have nausea and vomiting which was felt to  be secondary to gastroparesis and gastroesophageal reflux disease.  Patient was initially placed on clear liquids and diet slowly advanced.  Patient was started on Reglan as well as a PPI and as needed antiemetics.  Small multiple meals approach was discussed with patient who seemed to have understood.  Patient improved clinically.  Diet was slowly advanced such that by day of  discharge patient was tolerating a solid diet.  Outpatient follow-up.  4. Leukocytosis- WBC was  27.4 on admission, likely reactive in setting of acute DKA.  Urinalysis which was done was unremarkable.  Chest X-ray done was negative for any acute infiltrate.  Patient remained afebrile.  Leukocytosis trended down and had resolved by day of discharge.  Outpatient follow-up.   5. Transamenitis, mild-patient has mild elevation of AST 48, total bilirubin 1.9. Most likely associated with DKA, dehydration and recent alcohol abuse.  Patient was hydrated.  DKA was treated and had resolved by day of discharge.  LFTs were followed and had resolved by day of discharge.  Outpatient follow-up.  6. Oral lesions/esophagitis-patient noted to have some oral lesions/esophagitis.  Patient was placed on oral mouthwash with lidocaine.  Patient had no signs of thrush.  Patient was placed on a PPI twice daily as well as Carafate.  Outpatient follow-up.   7. Alcohol use: Cessation counseling has been provided.    Patient had no signs or symptoms of withdrawal during the hospitalization.  Outpatient follow-up.   8. Hyperkalemia/hypokalemia: associated with DKA initially and then GI loses and use of insulin.Patient's electrolytes were repleted by day of discharge. Outpatient follow up.       Procedures:  CXR 05/16/2017  Consultations:  None  Discharge Exam: Vitals:   05/21/17 0429 05/21/17 0900  BP: 132/82 122/72  Pulse: 68 86  Resp: 18 18  Temp: 98 F (36.7 C) 98.6 F (37 C)  SpO2: 100% 100%    General: NAD Cardiovascular: RRR Respiratory: CTAB  Discharge Instructions   Discharge Instructions    Diet Carb Modified   Complete by:  As directed    Increase activity slowly   Complete by:  As directed      Allergies as of 05/21/2017   No Known Allergies     Medication List    TAKE these medications   insulin aspart 100 UNIT/ML injection Commonly known as:  novoLOG Inject 4 Units into the skin 3  (three) times daily with meals. What changed:  how much to take   insulin detemir 100 UNIT/ML injection Commonly known as:  LEVEMIR Inject 0.2 mLs (20 Units total) into the skin at bedtime. What changed:  how much to take   magic mouthwash w/lidocaine Soln Take 10 mLs by mouth 4 (four) times daily as needed for mouth pain.   metoCLOPramide 5 MG tablet Commonly known as:  REGLAN Take 1 tablet (5 mg total) by mouth 4 (four) times daily -  before meals and at bedtime.   ondansetron 4 MG tablet Commonly known as:  ZOFRAN Take 1 tablet (4 mg total) by mouth every 8 (eight) hours as needed for nausea or vomiting.   pantoprazole 40 MG tablet Commonly known as:  PROTONIX Take 1 tablet (40 mg total) by mouth 2 (two) times daily.   sucralfate 1 GM/10ML suspension Commonly known as:  CARAFATE Take 10 mLs (1 g total) by mouth 3 (three) times daily.      No Known Allergies Follow-up Information    [facility]. Schedule an appointment  as soon as possible for a visit in 2 week(s).   Why:  Call at 9 am for hospiutal follow up appointment and a pcp. Follow up in 1-2 weeks. Contact information: [address] [place] [id number] [phone]           The results of significant diagnostics from this hospitalization (including imaging, microbiology, ancillary and laboratory) are listed below for reference.    Significant Diagnostic Studies: Dg Chest Port 1 View  Result Date: 05/16/2017 CLINICAL DATA:  Vomiting with hyperglycemia EXAM: PORTABLE CHEST 1 VIEW COMPARISON:  None. FINDINGS: The heart size and mediastinal contours are within normal limits. Both lungs are clear. The visualized skeletal structures are unremarkable. IMPRESSION: No active disease. Electronically Signed   By: Jasmine PangKim  Fujinaga M.D.   On: 05/16/2017 23:14    Microbiology: No results found for this or any previous visit (from the past 240 hour(s)).   Labs: Basic  Metabolic Panel: Recent Labs  Lab 05/17/17 0131 05/17/17 1122 05/19/17 0711 05/20/17 0541 05/21/17 0543  NA 142 139 139 137 137  K 5.1 3.4* 3.8 2.8* 3.3*  CL 116* 114* 109 109 105  CO2 9* 19* 17* 22 26  GLUCOSE 278* 121* 272* 211* 133*  BUN 15 12 11 8 6   CREATININE 1.13 0.73 0.75 0.48* 0.48*  CALCIUM 8.7* 9.0 9.1 7.4* 8.4*  MG  --   --   --   --  1.8   Liver Function Tests: Recent Labs  Lab 05/16/17 1546 05/19/17 0711 05/21/17 0543  AST 48* 30 17  ALT 47 27 19  ALKPHOS 93 63 50  BILITOT 1.9* 2.7* 1.5*  PROT 8.8* 7.0 5.8*  ALBUMIN 5.2* 3.9 3.3*   Recent Labs  Lab 05/16/17 1546  LIPASE 16   No results for input(s): AMMONIA in the last 168 hours. CBC: Recent Labs  Lab 05/17/17 1854 05/18/17 0039 05/18/17 [id number] 05/19/17 0711 05/21/17 0543  WBC 20.0* 18.4* 16.7* 13.3* 7.6  NEUTROABS 18.1* 15.8* 14.7*  --   --   HGB 13.6 13.3 13.5 13.9 13.2  HCT 39.0 38.2* 38.8* 39.6 37.8*  MCV 88.8 88.8 89.8 89.4 88.3  PLT 245 239 223 235 186   Cardiac Enzymes: No results for input(s): CKTOTAL, CKMB, CKMBINDEX, TROPONINI in the last 168 hours. BNP: BNP (last 3 results) No results for input(s): BNP in the last 8760 hours.  ProBNP (last 3 results) No results for input(s): PROBNP in the last 8760 hours.  CBG: Recent Labs  Lab 05/20/17 1150 05/20/17 1640 05/20/17 2146 05/21/17 0736 05/21/17 1228  GLUCAP 141* 151* 163* 136* 195*       Signed:  Ramiro Harvestaniel Cassandra Mcmanaman MD.  [facility] 05/21/2017, 3:50 PM

## 2017-05-21 NOTE — Progress Notes (Signed)
Pt discharged to home with wife at bedside.  Discussed discharge instructions.  Verbalized understanding.  Vitals stable.  Denies pain and nausea.  Pt has all belongings.  No other questions or concerns at this time.  Barrie LymeVance, Nayah Lukens E RN 6:07 PM 05/21/2017

## 2017-09-07 ENCOUNTER — Encounter (HOSPITAL_COMMUNITY): Payer: Self-pay | Admitting: Emergency Medicine

## 2017-09-07 ENCOUNTER — Other Ambulatory Visit: Payer: Self-pay

## 2017-09-07 DIAGNOSIS — Z79899 Other long term (current) drug therapy: Secondary | ICD-10-CM | POA: Diagnosis not present

## 2017-09-07 DIAGNOSIS — E109 Type 1 diabetes mellitus without complications: Secondary | ICD-10-CM | POA: Insufficient documentation

## 2017-09-07 DIAGNOSIS — K029 Dental caries, unspecified: Secondary | ICD-10-CM | POA: Diagnosis not present

## 2017-09-07 DIAGNOSIS — Z87891 Personal history of nicotine dependence: Secondary | ICD-10-CM | POA: Diagnosis not present

## 2017-09-07 DIAGNOSIS — K0889 Other specified disorders of teeth and supporting structures: Secondary | ICD-10-CM | POA: Diagnosis present

## 2017-09-07 DIAGNOSIS — K047 Periapical abscess without sinus: Secondary | ICD-10-CM | POA: Diagnosis not present

## 2017-09-07 NOTE — ED Triage Notes (Signed)
Patient states he has abscess in his lower gums on the right side. Patient states it is painful.

## 2017-09-08 ENCOUNTER — Emergency Department (HOSPITAL_COMMUNITY)
Admission: EM | Admit: 2017-09-08 | Discharge: 2017-09-08 | Disposition: A | Discharge status: Home / Self Care | Payer: Medicaid Other | Attending: Emergency Medicine | Admitting: Emergency Medicine

## 2017-09-08 DIAGNOSIS — K047 Periapical abscess without sinus: Secondary | ICD-10-CM

## 2017-09-08 LAB — CBG MONITORING, ED: GLUCOSE-CAPILLARY: 95 mg/dL (ref 65–99)

## 2017-09-08 MED ORDER — PENICILLIN V POTASSIUM 500 MG PO TABS
500.0000 mg | ORAL_TABLET | Freq: Once | ORAL | Status: AC
  Administered 2017-09-08: 500 mg via ORAL
  Filled 2017-09-08: qty 1

## 2017-09-08 MED ORDER — HYDROCODONE-ACETAMINOPHEN 5-325 MG PO TABS: 1.0000 | ORAL_TABLET | ORAL | 0 refills | Status: DC | PRN

## 2017-09-08 MED ORDER — IBUPROFEN 600 MG PO TABS: 600.0000 mg | ORAL_TABLET | Freq: Four times a day (QID) | ORAL | 0 refills | Status: DC | PRN

## 2017-09-08 MED ORDER — PENICILLIN V POTASSIUM 500 MG PO TABS: 500.0000 mg | ORAL_TABLET | Freq: Three times a day (TID) | ORAL | 0 refills | Status: DC

## 2017-09-08 NOTE — ED Provider Notes (Signed)
[facility]-EMERGENCY DEPT Provider Note   CSN: [id number] Arrival date & time: 09/07/17  2215     History   Chief Complaint Chief Complaint  Patient presents with  . Abscess    HPI Shawn Maldonado is a 32 y.o. male.  Patient presents for evaluation of suspected dental abscess of lower incisors. He reports he is aware he has cavities and needs dental follow up but noticed a very painful swelling at the base of the front lower teeth yesterday. No fever. No difficulty swallowing.   The history is provided by the patient. No language interpreter was used.  Abscess  Associated symptoms: no fever and no nausea     Past Medical History:  Diagnosis Date  . Diabetes mellitus without complication [facility])     Patient Active Problem List   Diagnosis Date Noted  . Diabetic gastroparesis (HCC) 05/21/2017  . Leukocytosis 05/21/2017  . Transaminitis 05/21/2017  . Nausea & vomiting 05/17/2017  . AKI (acute kidney injury) (HCC) 05/17/2017  . Elevated LFTs 05/17/2017  . DKA, type 1 (HCC) 05/16/2017  . Hyperbilirubinemia 08/17/2015  . Intractable vomiting   . Type 1 diabetes mellitus with ketoacidosis without coma (HCC)   . Type I diabetes mellitus (HCC) 08/14/2015  . Peripheral neuropathy 08/14/2015  . Diabetic ketoacidosis without coma associated with type 1 diabetes mellitus (HCC)   . Opiate withdrawal (HCC)   . Acute upper respiratory infection 04/30/2015  . Hypokalemia 09/25/2012  . Diabetes mellitus (HCC) 09/24/2012  . DKA (diabetic ketoacidoses) (HCC) 09/24/2012  . Oral thrush 09/24/2012    Past Surgical History:  Procedure Laterality Date  . MOUTH SURGERY          Home Medications    Prior to Admission medications   Medication Sig Start Date End Date Taking? Authorizing Provider  insulin aspart (NOVOLOG) 100 UNIT/ML injection Inject 4 Units into the skin 3 (three) times daily with meals. Patient taking differently: Inject 20 Units into the skin 3  (three) times daily with meals.  05/02/15   Albertine Grates, MD  insulin detemir (LEVEMIR) 100 UNIT/ML injection Inject 0.2 mLs (20 Units total) into the skin at bedtime. 05/21/17   Rodolph Bong, MD  magic mouthwash w/lidocaine SOLN Take 10 mLs by mouth 4 (four) times daily as needed for mouth pain. 05/21/17   Rodolph Bong, MD  metoCLOPramide (REGLAN) 5 MG tablet Take 1 tablet (5 mg total) by mouth 4 (four) times daily -  before meals and at bedtime. 05/21/17   Rodolph Bong, MD  ondansetron (ZOFRAN) 4 MG tablet Take 1 tablet (4 mg total) by mouth every 8 (eight) hours as needed for nausea or vomiting. 08/19/15   Inez Catalina, MD  pantoprazole (PROTONIX) 40 MG tablet Take 1 tablet (40 mg total) by mouth 2 (two) times daily. 05/21/17   Rodolph Bong, MD  sucralfate (CARAFATE) 1 GM/10ML suspension Take 10 mLs (1 g total) by mouth 3 (three) times daily. 05/21/17   Rodolph Bong, MD    Family History Family History  Problem Relation Age of Onset  . Stroke Father   . CAD Other     Social History Social History   Tobacco Use  . Smoking status: Former Smoker    Types: Cigarettes  . Smokeless tobacco: Never Used  Substance Use Topics  . Alcohol use: Yes    Comment: social  . Drug use: Yes    Types: Marijuana     Allergies  Patient has no known allergies.   Review of Systems Review of Systems  Constitutional: Negative for fever.  HENT: Positive for dental problem. Negative for facial swelling and trouble swallowing.   Gastrointestinal: Negative for nausea.     Physical Exam Updated Vital Signs BP 126/87   Pulse 67   Temp 97.8 F (36.6 C) (Oral)   Resp 14   Ht  (1.778 m)   Wt 61.2 kg (135 lb)   SpO2 99%   BMI 19.37 kg/m   Physical Exam  Constitutional: He is oriented to person, place, and time. He appears well-developed and well-nourished.  HENT:  Mouth/Throat: Oropharynx is clear and moist.  Sublingual swelling of multiple lower incisors. No  drainage. Stable dentition.   Neck: Normal range of motion.  Pulmonary/Chest: Effort normal.  Musculoskeletal: Normal range of motion.  Neurological: He is alert and oriented to person, place, and time.  Skin: Skin is warm and dry.  Psychiatric: He has a normal mood and affect.     ED Treatments / Results  Labs (all labs ordered are listed, but only abnormal results are displayed) Labs Reviewed  CBG MONITORING, ED    EKG None  Radiology No results found.  Procedures Procedures (including critical care time)  Medications Ordered in ED Medications  penicillin v potassium (VEETID) tablet 500 mg (has no administration in time range)     Initial Impression / Assessment and Plan / ED Course  I have reviewed the triage vital signs and the nursing notes.  Pertinent labs & imaging results that were available during my care of the patient were reviewed by me and considered in my medical decision making (see chart for details).     Patient here with dental pain and exam c/w dental abscess. He has a history of IDDM and reports CBGs have been stable.   Will start on PCN and provide dental resources.   Final Clinical Impressions(s) / ED Diagnoses   Final diagnoses:  None   1. Dental abscess  ED Discharge Orders    None       Elpidio Anis, PA-C 09/08/17 0308    Derwood Kaplan, MD 09/08/17 [phone]

## 2017-10-18 ENCOUNTER — Observation Stay (HOSPITAL_COMMUNITY)
Admission: EM | Admit: 2017-10-18 | Discharge: 2017-10-20 | Disposition: A | Discharge status: Home / Self Care | Payer: Medicaid Other | Attending: Internal Medicine | Admitting: Internal Medicine

## 2017-10-18 ENCOUNTER — Encounter (HOSPITAL_COMMUNITY): Payer: Self-pay | Admitting: Emergency Medicine

## 2017-10-18 ENCOUNTER — Other Ambulatory Visit: Payer: Self-pay

## 2017-10-18 DIAGNOSIS — E1010 Type 1 diabetes mellitus with ketoacidosis without coma: Principal | ICD-10-CM | POA: Insufficient documentation

## 2017-10-18 DIAGNOSIS — E876 Hypokalemia: Secondary | ICD-10-CM | POA: Diagnosis not present

## 2017-10-18 DIAGNOSIS — Z794 Long term (current) use of insulin: Secondary | ICD-10-CM | POA: Diagnosis not present

## 2017-10-18 DIAGNOSIS — K2900 Acute gastritis without bleeding: Secondary | ICD-10-CM | POA: Insufficient documentation

## 2017-10-18 DIAGNOSIS — Z87891 Personal history of nicotine dependence: Secondary | ICD-10-CM | POA: Diagnosis not present

## 2017-10-18 DIAGNOSIS — Z79899 Other long term (current) drug therapy: Secondary | ICD-10-CM | POA: Insufficient documentation

## 2017-10-18 LAB — BASIC METABOLIC PANEL
ANION GAP: 21 — AB (ref 5–15)
Anion gap: 18 — ABNORMAL HIGH (ref 5–15)
Anion gap: 12 (ref 5–15)
Anion gap: 12 (ref 5–15)
BUN: 16 mg/dL (ref 6–20)
BUN: 14 mg/dL (ref 6–20)
BUN: 11 mg/dL (ref 6–20)
BUN: 10 mg/dL (ref 6–20)
CALCIUM: 9.6 mg/dL (ref 8.9–10.3)
CHLORIDE: 102 mmol/L (ref 101–111)
CHLORIDE: 109 mmol/L (ref 101–111)
CHLORIDE: 111 mmol/L (ref 101–111)
CHLORIDE: 108 mmol/L (ref 101–111)
CO2: 18 mmol/L — AB (ref 22–32)
CO2: 15 mmol/L — AB (ref 22–32)
CO2: 18 mmol/L — AB (ref 22–32)
CO2: 21 mmol/L — AB (ref 22–32)
Calcium: 8.3 mg/dL — ABNORMAL LOW (ref 8.9–10.3)
Calcium: 8.4 mg/dL — ABNORMAL LOW (ref 8.9–10.3)
Calcium: 8.7 mg/dL — ABNORMAL LOW (ref 8.9–10.3)
Creatinine, Ser: 0.87 mg/dL (ref 0.61–1.24)
Creatinine, Ser: 0.82 mg/dL (ref 0.61–1.24)
Creatinine, Ser: 0.78 mg/dL (ref 0.61–1.24)
Creatinine, Ser: 0.76 mg/dL (ref 0.61–1.24)
GFR calc Af Amer: >60 mL/min (ref >60)
GFR calc non Af Amer: >60 mL/min (ref >60)
GFR calc non Af Amer: >60 mL/min (ref >60)
GFR calc non Af Amer: >60 mL/min (ref >60)
GFR calc non Af Amer: >60 mL/min (ref >60)
GLUCOSE: 281 mg/dL — AB (ref 65–99)
Glucose, Bld: 390 mg/dL — ABNORMAL HIGH (ref 65–99)
Glucose, Bld: 213 mg/dL — ABNORMAL HIGH (ref 65–99)
Glucose, Bld: 167 mg/dL — ABNORMAL HIGH (ref 65–99)
Potassium: 4.1 mmol/L (ref 3.5–5.1)
Potassium: 4.2 mmol/L (ref 3.5–5.1)
Potassium: 4.3 mmol/L (ref 3.5–5.1)
Potassium: 3.9 mmol/L (ref 3.5–5.1)
Sodium: 141 mmol/L (ref 135–145)
Sodium: 142 mmol/L (ref 135–145)
Sodium: 141 mmol/L (ref 135–145)
Sodium: 141 mmol/L (ref 135–145)

## 2017-10-18 LAB — GLUCOSE, CAPILLARY
GLUCOSE-CAPILLARY: 235 mg/dL — AB (ref 65–99)
GLUCOSE-CAPILLARY: 243 mg/dL — AB (ref 65–99)
GLUCOSE-CAPILLARY: 202 mg/dL — AB (ref 65–99)
GLUCOSE-CAPILLARY: 180 mg/dL — AB (ref 65–99)
GLUCOSE-CAPILLARY: 160 mg/dL — AB (ref 65–99)
GLUCOSE-CAPILLARY: 149 mg/dL — AB (ref 65–99)
GLUCOSE-CAPILLARY: 157 mg/dL — AB (ref 65–99)
Glucose-Capillary: 206 mg/dL — ABNORMAL HIGH (ref 65–99)
Glucose-Capillary: 138 mg/dL — ABNORMAL HIGH (ref 65–99)
Glucose-Capillary: 172 mg/dL — ABNORMAL HIGH (ref 65–99)
Glucose-Capillary: 158 mg/dL — ABNORMAL HIGH (ref 65–99)

## 2017-10-18 LAB — CBC WITH DIFFERENTIAL/PLATELET
BASOS ABS: 0 10*3/uL (ref 0.0–0.1)
BASOS PCT: 0 %
Eosinophils Absolute: 0 10*3/uL (ref 0.0–0.7)
Eosinophils Relative: 0 %
HEMATOCRIT: 43.1 % (ref 39.0–52.0)
HEMOGLOBIN: 15.4 g/dL (ref 13.0–17.0)
Lymphocytes Relative: 16 %
Lymphs Abs: 2.4 10*3/uL (ref 0.7–4.0)
MCH: 31.2 pg (ref 26.0–34.0)
MCHC: 35.7 g/dL (ref 30.0–36.0)
MCV: 87.2 fL (ref 78.0–100.0)
MONOS PCT: 3 %
Monocytes Absolute: 0.5 10*3/uL (ref 0.1–1.0)
NEUTROS ABS: 11.9 10*3/uL — AB (ref 1.7–7.7)
NEUTROS PCT: 81 %
Platelets: 249 10*3/uL (ref 150–400)
RBC: 4.94 MIL/uL (ref 4.22–5.81)
RDW: 12.4 % (ref 11.5–15.5)
WBC: 14.8 10*3/uL — ABNORMAL HIGH (ref 4.0–10.5)

## 2017-10-18 LAB — MAGNESIUM: Magnesium: 1.5 mg/dL — ABNORMAL LOW (ref 1.7–2.4)

## 2017-10-18 LAB — BLOOD GAS, VENOUS
Acid-base deficit: 7.5 mmol/L — ABNORMAL HIGH (ref 0.0–2.0)
BICARBONATE: 16.5 mmol/L — AB (ref 20.0–28.0)
O2 Saturation: 83.4 %
PH VEN: 7.349 (ref 7.250–7.430)
Patient temperature: 98.6
pCO2, Ven: 30.8 mmHg — ABNORMAL LOW (ref 44.0–60.0)
pO2, Ven: 52 mmHg — ABNORMAL HIGH (ref 32.0–45.0)

## 2017-10-18 LAB — HEMOGLOBIN A1C
Hgb A1c MFr Bld: 12.4 % — ABNORMAL HIGH (ref 4.8–5.6)
Mean Plasma Glucose: 309.18 mg/dL

## 2017-10-18 LAB — CBG MONITORING, ED
Glucose-Capillary: 378 mg/dL — ABNORMAL HIGH (ref 65–99)
Glucose-Capillary: 323 mg/dL — ABNORMAL HIGH (ref 65–99)

## 2017-10-18 MED ORDER — LACTATED RINGERS IV BOLUS
1000.0000 mL | Freq: Once | INTRAVENOUS | Status: AC
Start: 2017-10-18 — End: 2017-10-18
  Administered 2017-10-18: 1000 mL via INTRAVENOUS

## 2017-10-18 MED ORDER — ACETAMINOPHEN 650 MG RE SUPP: 650.0000 mg | Freq: Four times a day (QID) | RECTAL | Status: DC | PRN

## 2017-10-18 MED ORDER — ENOXAPARIN SODIUM 40 MG/0.4ML ~~LOC~~ SOLN
40.0000 mg | SUBCUTANEOUS | Status: DC
  Administered 2017-10-18 – 2017-10-19 (×2): 40 mg via SUBCUTANEOUS
  Filled 2017-10-18 (×2): qty 0.4

## 2017-10-18 MED ORDER — SODIUM CHLORIDE 0.9 % IV SOLN
INTRAVENOUS | Status: DC
  Administered 2017-10-18: 12:00:00 via INTRAVENOUS

## 2017-10-18 MED ORDER — METOCLOPRAMIDE HCL 5 MG PO TABS
5.0000 mg | ORAL_TABLET | Freq: Three times a day (TID) | ORAL | Status: DC
  Administered 2017-10-18 – 2017-10-20 (×7): 5 mg via ORAL
  Filled 2017-10-18 (×7): qty 1

## 2017-10-18 MED ORDER — POTASSIUM CHLORIDE 10 MEQ/100ML IV SOLN
10.0000 meq | INTRAVENOUS | Status: AC
  Administered 2017-10-18 (×2): 10 meq via INTRAVENOUS
  Filled 2017-10-18 (×2): qty 100

## 2017-10-18 MED ORDER — SODIUM CHLORIDE 0.9 % IV BOLUS
1000.0000 mL | Freq: Once | INTRAVENOUS | Status: AC
  Administered 2017-10-18: 1000 mL via INTRAVENOUS

## 2017-10-18 MED ORDER — PROMETHAZINE HCL 25 MG/ML IJ SOLN
12.5000 mg | Freq: Four times a day (QID) | INTRAMUSCULAR | Status: DC | PRN
  Administered 2017-10-18: 12.5 mg via INTRAVENOUS
  Filled 2017-10-18: qty 1

## 2017-10-18 MED ORDER — MAGNESIUM SULFATE 2 GM/50ML IV SOLN
2.0000 g | Freq: Once | INTRAVENOUS | Status: AC
  Administered 2017-10-18: 2 g via INTRAVENOUS
  Filled 2017-10-18: qty 50

## 2017-10-18 MED ORDER — ONDANSETRON HCL 4 MG PO TABS
4.0000 mg | ORAL_TABLET | Freq: Four times a day (QID) | ORAL | Status: DC | PRN
  Filled 2017-10-18: qty 1

## 2017-10-18 MED ORDER — SENNOSIDES-DOCUSATE SODIUM 8.6-50 MG PO TABS: 1.0000 | ORAL_TABLET | Freq: Every evening | ORAL | Status: DC | PRN

## 2017-10-18 MED ORDER — SODIUM CHLORIDE 0.9 % IV SOLN
INTRAVENOUS | Status: DC
  Administered 2017-10-18: 2.6 [IU]/h via INTRAVENOUS

## 2017-10-18 MED ORDER — ONDANSETRON HCL 4 MG/2ML IJ SOLN
4.0000 mg | Freq: Once | INTRAMUSCULAR | Status: AC
  Administered 2017-10-18: 4 mg via INTRAVENOUS
  Filled 2017-10-18: qty 2

## 2017-10-18 MED ORDER — ONDANSETRON HCL 4 MG/2ML IJ SOLN
4.0000 mg | Freq: Four times a day (QID) | INTRAMUSCULAR | Status: DC | PRN
Start: 2017-10-18 — End: 2017-10-20
  Administered 2017-10-18 (×2): 4 mg via INTRAVENOUS
  Filled 2017-10-18 (×2): qty 2

## 2017-10-18 MED ORDER — SUCRALFATE 1 GM/10ML PO SUSP
1.0000 g | Freq: Three times a day (TID) | ORAL | Status: DC
  Administered 2017-10-18 – 2017-10-20 (×5): 1 g via ORAL
  Filled 2017-10-18 (×5): qty 10

## 2017-10-18 MED ORDER — DEXTROSE-NACL 5-0.45 % IV SOLN
INTRAVENOUS | Status: DC
  Administered 2017-10-18: 125 mL/h via INTRAVENOUS

## 2017-10-18 MED ORDER — PANTOPRAZOLE SODIUM 40 MG PO TBEC
40.0000 mg | DELAYED_RELEASE_TABLET | Freq: Two times a day (BID) | ORAL | Status: DC
  Administered 2017-10-18 – 2017-10-20 (×4): 40 mg via ORAL
  Filled 2017-10-18 (×4): qty 1

## 2017-10-18 MED ORDER — TRAMADOL HCL 50 MG PO TABS: 50.0000 mg | ORAL_TABLET | Freq: Four times a day (QID) | ORAL | Status: DC | PRN

## 2017-10-18 MED ORDER — SODIUM CHLORIDE 0.9 % IV SOLN
INTRAVENOUS | Status: DC
  Administered 2017-10-18: 2.6 [IU]/h via INTRAVENOUS
  Filled 2017-10-18: qty 1

## 2017-10-18 MED ORDER — ACETAMINOPHEN 325 MG PO TABS: 650.0000 mg | ORAL_TABLET | Freq: Four times a day (QID) | ORAL | Status: DC | PRN

## 2017-10-18 NOTE — Progress Notes (Signed)
PHYSICIAN NOTIFIED OF LAB VALUES AND CONTINUED N/V. NEW ORDERS RECEIVED. SEE PHENERGAN ORDER. CONTINUE BGL Q1 HR AND D5W1/2NS AT 125 ML/HR. ALSO CONTINUE LABS 4 HOURS.

## 2017-10-18 NOTE — ED Provider Notes (Signed)
[facility]-EMERGENCY DEPT Provider Note   CSN: [id number] Arrival date & time: 10/18/17  0930     History   Chief Complaint Chief Complaint  Patient presents with  . Hyperglycemia  . Nausea  . Emesis    HPI Shawn Maldonado is a 32 y.o. male.  HPI  32 year old male with a history of type 1 diabetes presents with vomiting and hyperglycemia.  He states that he ran out of his insulin 2 days ago.  His last glucose he has checked was 2 days ago and 150.  He is having vomiting and diarrhea.  No blood in either.  No abdominal pain.  No headache, chest pain, shortness of breath, fever.  Past Medical History:  Diagnosis Date  . Diabetes mellitus without complication [facility](HCC)     Patient Active Problem List   Diagnosis Date Noted  . Diabetic gastroparesis (HCC) 05/21/2017  . Leukocytosis 05/21/2017  . Transaminitis 05/21/2017  . Nausea & vomiting 05/17/2017  . AKI (acute kidney injury) (HCC) 05/17/2017  . Elevated LFTs 05/17/2017  . DKA, type 1 (HCC) 05/16/2017  . Hyperbilirubinemia 08/17/2015  . Intractable vomiting   . Type 1 diabetes mellitus with ketoacidosis without coma (HCC)   . Type I diabetes mellitus (HCC) 08/14/2015  . Peripheral neuropathy 08/14/2015  . Diabetic ketoacidosis without coma associated with type 1 diabetes mellitus (HCC)   . Opiate withdrawal (HCC)   . Acute upper respiratory infection 04/30/2015  . Hypokalemia 09/25/2012  . Diabetes mellitus (HCC) 09/24/2012  . DKA (diabetic ketoacidoses) (HCC) 09/24/2012  . Oral thrush 09/24/2012    Past Surgical History:  Procedure Laterality Date  . MOUTH SURGERY          Home Medications    Prior to Admission medications   Medication Sig Start Date End Date Taking? Authorizing Provider  HYDROcodone-acetaminophen (NORCO/VICODIN) 5-325 MG tablet Take 1-2 tablets by mouth every 4 (four) hours as needed. Patient not taking: Reported on 10/18/2017 09/08/17   Elpidio AnisUpstill, Shari, PA-C  ibuprofen  (ADVIL,MOTRIN) 600 MG tablet Take 1 tablet (600 mg total) by mouth every 6 (six) hours as needed. Patient not taking: Reported on 10/18/2017 09/08/17   Elpidio AnisUpstill, Shari, PA-C  insulin aspart (NOVOLOG) 100 UNIT/ML injection Inject 4 Units into the skin 3 (three) times daily with meals. Patient taking differently: Inject 20 Units into the skin 3 (three) times daily with meals.  05/02/15   Albertine GratesXu, Fang, MD  insulin detemir (LEVEMIR) 100 UNIT/ML injection Inject 0.2 mLs (20 Units total) into the skin at bedtime. 05/21/17   Rodolph Bonghompson, Daniel V, MD  magic mouthwash w/lidocaine SOLN Take 10 mLs by mouth 4 (four) times daily as needed for mouth pain. 05/21/17   Rodolph Bonghompson, Daniel V, MD  metoCLOPramide (REGLAN) 5 MG tablet Take 1 tablet (5 mg total) by mouth 4 (four) times daily -  before meals and at bedtime. 05/21/17   Rodolph Bonghompson, Daniel V, MD  ondansetron (ZOFRAN) 4 MG tablet Take 1 tablet (4 mg total) by mouth every 8 (eight) hours as needed for nausea or vomiting. 08/19/15   Inez CatalinaMullen, Emily B, MD  pantoprazole (PROTONIX) 40 MG tablet Take 1 tablet (40 mg total) by mouth 2 (two) times daily. 05/21/17   Rodolph Bonghompson, Daniel V, MD  penicillin v potassium (VEETID) 500 MG tablet Take 1 tablet (500 mg total) by mouth 3 (three) times daily. 09/08/17   Elpidio AnisUpstill, Shari, PA-C  sucralfate (CARAFATE) 1 GM/10ML suspension Take 10 mLs (1 g total) by mouth 3 (three) times daily.  05/21/17   Rodolph Bong, MD    Family History Family History  Problem Relation Age of Onset  . Stroke Father   . CAD Other     Social History Social History   Tobacco Use  . Smoking status: Former Smoker    Types: Cigarettes  . Smokeless tobacco: Never Used  Substance Use Topics  . Alcohol use: Yes    Comment: social  . Drug use: Yes    Types: Marijuana     Allergies   Patient has no known allergies.   Review of Systems Review of Systems  Constitutional: Negative for fever.  Respiratory: Negative for shortness of breath.   Cardiovascular:  Negative for chest pain.  Gastrointestinal: Positive for diarrhea, nausea and vomiting. Negative for abdominal pain and blood in stool.  Genitourinary: Negative for dysuria.  Neurological: Negative for headaches.  All other systems reviewed and are negative.    Physical Exam Updated Vital Signs BP 128/81   Pulse (!) 58   Temp 97.7 F (36.5 C) (Oral)   Resp 16   SpO2 100%   Physical Exam  Constitutional: He is oriented to person, place, and time. He appears well-developed and well-nourished.  HENT:  Head: Normocephalic and atraumatic.  Right Ear: External ear normal.  Left Ear: External ear normal.  Nose: Nose normal.  Eyes: Right eye exhibits no discharge. Left eye exhibits no discharge.  Neck: Neck supple.  Cardiovascular: Regular rhythm and normal heart sounds. Tachycardia present.  HR~100  Pulmonary/Chest: Effort normal and breath sounds normal.  Abdominal: Soft. There is no tenderness.  Musculoskeletal: He exhibits no edema.  Neurological: He is alert and oriented to person, place, and time.  Skin: Skin is warm and dry.  Nursing note and vitals reviewed.    ED Treatments / Results  Labs (all labs ordered are listed, but only abnormal results are displayed) Labs Reviewed  BASIC METABOLIC PANEL - Abnormal; Notable for the following components:      Result Value   CO2 18 (*)    Glucose, Bld 390 (*)    Anion gap 21 (*)    All other components within normal limits  BLOOD GAS, VENOUS - Abnormal; Notable for the following components:   pCO2, Ven 30.8 (*)    pO2, Ven 52.0 (*)    Bicarbonate 16.5 (*)    Acid-base deficit 7.5 (*)    All other components within normal limits  CBC WITH DIFFERENTIAL/PLATELET - Abnormal; Notable for the following components:   WBC 14.8 (*)    Neutro Abs 11.9 (*)    All other components within normal limits  CBG MONITORING, ED - Abnormal; Notable for the following components:   Glucose-Capillary 378 (*)    All other components within  normal limits  URINALYSIS, ROUTINE W REFLEX MICROSCOPIC    EKG None  Radiology No results found.  Procedures .Critical Care Performed by: Pricilla Loveless, MD Authorized by: Pricilla Loveless, MD   Critical care provider statement:    Critical care time (minutes):  30   Critical care time was exclusive of:  Separately billable procedures and treating other patients   Critical care was necessary to treat or prevent imminent or life-threatening deterioration of the following conditions:  Endocrine crisis   Critical care was time spent personally by me on the following activities:  Development of treatment plan with patient or surrogate, discussions with consultants, evaluation of patient's response to treatment, examination of patient, obtaining history from patient or surrogate, ordering and  performing treatments and interventions, ordering and review of laboratory studies, ordering and review of radiographic studies, pulse oximetry and re-evaluation of patient's condition   (including critical care time)  Medications Ordered in ED Medications  sodium chloride 0.9 % bolus 1,000 mL (1,000 mLs Intravenous New Bag/Given 10/18/17 1017)  insulin regular (NOVOLIN R,HUMULIN R) 100 Units in sodium chloride 0.9 % 100 mL (1 Units/mL) infusion (has no administration in time range)  potassium chloride 10 mEq in 100 mL IVPB (10 mEq Intravenous New Bag/Given 10/18/17 1056)  lactated ringers bolus 1,000 mL (has no administration in time range)  ondansetron (ZOFRAN) injection 4 mg (4 mg Intravenous Given 10/18/17 1017)     Initial Impression / Assessment and Plan / ED Course  I have reviewed the triage vital signs and the nursing notes.  Pertinent labs & imaging results that were available during my care of the patient were reviewed by me and considered in my medical decision making (see chart for details).     Lab work is consistent with DKA with an elevated anion gap of 21 and a low bicarbonate of  18.  He is mildly compensated with tachypnea but otherwise will need further treatment of his DKA.  He has been started on IV fluids and insulin drip.  Potassium is 4.1.  He will be treated per DKA protocol.  Dr. Alvino Chapel to admit.  Final Clinical Impressions(s) / ED Diagnoses   Final diagnoses:  Diabetic ketoacidosis without coma associated with type 1 diabetes mellitus [facility])    ED Discharge Orders    None       Pricilla Loveless, MD 10/18/17 1058

## 2017-10-18 NOTE — H&P (Signed)
History and Physical    Shawn Maldonado ZOX:[id number] DOB: 01-19-1986 DOA: 10/18/2017  PCP: [facility]  Patient coming from: Home  Chief Complaint: Nausea, vomiting  HPI: Shawn Maldonado is a 32 y.o. male with medical history significant of DM type 1 on insulin who presents with nausea and vomiting. His symptom started this morning. He admits that he missed his insulin doses for 2 days. He has no fever, chest pain, shortness of breath, abdominal pain. He states he feels like he is "burning" and has nothing in his stomach due to vomiting.    ED Course: Labs revealed blood sugar elevated 390 with CO2 18 and AG 21. He was started on IVF and IV insulin.   Review of Systems: As per HPI otherwise 10 point review of systems negative.   Past Medical History:  Diagnosis Date  . Diabetes mellitus without complication [facility](HCC)     Past Surgical History:  Procedure Laterality Date  . MOUTH SURGERY       reports that he has quit smoking. His smoking use included cigarettes. He has never used smokeless tobacco. He reports that he drinks alcohol. He reports that he has current or past drug history. Drug: Marijuana.  No Known Allergies  Family History  Problem Relation Age of Onset  . Stroke Father   . CAD Other     Prior to Admission medications   Medication Sig Start Date End Date Taking? Authorizing Provider  HYDROcodone-acetaminophen (NORCO/VICODIN) 5-325 MG tablet Take 1-2 tablets by mouth every 4 (four) hours as needed. Patient not taking: Reported on 10/18/2017 09/08/17   Elpidio AnisUpstill, Shari, PA-C  ibuprofen (ADVIL,MOTRIN) 600 MG tablet Take 1 tablet (600 mg total) by mouth every 6 (six) hours as needed. Patient not taking: Reported on 10/18/2017 09/08/17   Elpidio AnisUpstill, Shari, PA-C  insulin aspart (NOVOLOG) 100 UNIT/ML injection Inject 4 Units into the skin 3 (three) times daily with meals. Patient taking differently: Inject 20 Units into the skin 3 (three) times daily with meals.  05/02/15    Albertine GratesXu, Fang, MD  insulin detemir (LEVEMIR) 100 UNIT/ML injection Inject 0.2 mLs (20 Units total) into the skin at bedtime. 05/21/17   Rodolph Bonghompson, Daniel V, MD  magic mouthwash w/lidocaine SOLN Take 10 mLs by mouth 4 (four) times daily as needed for mouth pain. 05/21/17   Rodolph Bonghompson, Daniel V, MD  metoCLOPramide (REGLAN) 5 MG tablet Take 1 tablet (5 mg total) by mouth 4 (four) times daily -  before meals and at bedtime. 05/21/17   Rodolph Bonghompson, Daniel V, MD  ondansetron (ZOFRAN) 4 MG tablet Take 1 tablet (4 mg total) by mouth every 8 (eight) hours as needed for nausea or vomiting. 08/19/15   Inez CatalinaMullen, Emily B, MD  pantoprazole (PROTONIX) 40 MG tablet Take 1 tablet (40 mg total) by mouth 2 (two) times daily. 05/21/17   Rodolph Bonghompson, Daniel V, MD  penicillin v potassium (VEETID) 500 MG tablet Take 1 tablet (500 mg total) by mouth 3 (three) times daily. 09/08/17   Elpidio AnisUpstill, Shari, PA-C  sucralfate (CARAFATE) 1 GM/10ML suspension Take 10 mLs (1 g total) by mouth 3 (three) times daily. 05/21/17   Rodolph Bonghompson, Daniel V, MD    Physical Exam: Vitals:   10/18/17 0946 10/18/17 1014  BP: 133/87 128/81  Pulse: 100 (!) 58  Resp: 18 16  Temp: 97.7 F (36.5 C)   TempSrc: Oral   SpO2: 98% 100%     Constitutional: NAD, calm, uncomfortable appearing  Eyes: PERRL, lids and conjunctivae normal ENMT: Mucous membranes  are dry. Posterior pharynx clear of any exudate or lesions.Normal dentition.  Neck: normal, supple, no masses, no thyromegaly Respiratory: clear to auscultation bilaterally, no wheezing, no crackles. Normal respiratory effort. No accessory muscle use.  Cardiovascular: Regular rate and rhythm, no murmurs / rubs / gallops. No extremity edema.  Abdomen: no tenderness, no masses palpated. No hepatosplenomegaly. Bowel sounds positive.  Musculoskeletal: no clubbing / cyanosis. No joint deformity upper and lower extremities. Good ROM, no contractures. Normal muscle tone.  Skin: no rashes, lesions, ulcers. No  induration Neurologic: CN 2-12 grossly intact. Strength 5/5 in all 4.  Psychiatric: Normal judgment and insight. Alert and oriented x 3. Normal mood.   Labs on Admission: I have personally reviewed following labs and imaging studies  CBC: Recent Labs  Lab 10/18/17 0957  WBC 14.8*  NEUTROABS 11.9*  HGB 15.4  HCT 43.1  MCV 87.2  PLT 249   Basic Metabolic Panel: Recent Labs  Lab 10/18/17 0957  NA 141  K 4.1  CL 102  CO2 18*  GLUCOSE 390*  BUN 16  CREATININE 0.87  CALCIUM 9.6   GFR: CrCl cannot be calculated (Unknown ideal weight.). Liver Function Tests: No results for input(s): AST, ALT, ALKPHOS, BILITOT, PROT, ALBUMIN in the last 168 hours. No results for input(s): LIPASE, AMYLASE in the last 168 hours. No results for input(s): AMMONIA in the last 168 hours. Coagulation Profile: No results for input(s): INR, PROTIME in the last 168 hours. Cardiac Enzymes: No results for input(s): CKTOTAL, CKMB, CKMBINDEX, TROPONINI in the last 168 hours. BNP (last 3 results) No results for input(s): PROBNP in the last 8760 hours. HbA1C: No results for input(s): HGBA1C in the last 72 hours. CBG: Recent Labs  Lab 10/18/17 0942  GLUCAP 378*   Lipid Profile: No results for input(s): CHOL, HDL, LDLCALC, TRIG, CHOLHDL, LDLDIRECT in the last 72 hours. Thyroid Function Tests: No results for input(s): TSH, T4TOTAL, FREET4, T3FREE, THYROIDAB in the last 72 hours. Anemia Panel: No results for input(s): VITAMINB12, FOLATE, FERRITIN, TIBC, IRON, RETICCTPCT in the last 72 hours. Urine analysis:    Component Value Date/Time   COLORURINE STRAW (A) 05/16/2017 2205   APPEARANCEUR CLEAR 05/16/2017 2205   LABSPEC 1.017 05/16/2017 2205   PHURINE 5.0 05/16/2017 2205   GLUCOSEU >=500 (A) 05/16/2017 2205   HGBUR SMALL (A) 05/16/2017 2205   BILIRUBINUR NEGATIVE 05/16/2017 2205   KETONESUR 80 (A) 05/16/2017 2205   PROTEINUR 30 (A) 05/16/2017 2205   UROBILINOGEN 0.2 09/24/2012 1037   NITRITE  NEGATIVE 05/16/2017 2205   LEUKOCYTESUR NEGATIVE 05/16/2017 2205   Sepsis Labs: !!!!!!!!!!!!!!!!!!!!!!!!!!!!!!!!!!!!!!!!!!!! @LABRCNTIP (procalcitonin:4,lacticidven:4) )No results found for this or any previous visit (from the past 240 hour(s)).   Radiological Exams on Admission: No results found.   Assessment/Plan Active Problems:   DKA, type 1 (HCC)   DKA -Insulin gtt -IVF  -Check K, Mg every 4 hours and replace electrolytes as needed -Check Ha1c   Anion gap metabolic acidosis -Due to above -Trend    DVT prophylaxis: Lovenox Code Status: Full  Family Communication: No family at bedside  Disposition Plan: Suspect discharge home tomorrow once blood sugar and metabolic acidosis stabilized  Consults called: None  Admission status: Observation   Severity of Illness: The appropriate patient status for this patient is OBSERVATION. Observation status is judged to be reasonable and necessary in order to provide the required intensity of service to ensure the patient's safety. The patient's presenting symptoms, physical exam findings, and initial radiographic and laboratory data in the  context of their medical condition is felt to place them at decreased risk for further clinical deterioration. Furthermore, it is anticipated that the patient will be medically stable for discharge from the hospital within 2 midnights of admission.   Noralee Stain, DO [facility] [email or web address] Password [email or web address] 10/18/2017, 11:04 AM

## 2017-10-18 NOTE — ED Triage Notes (Signed)
Patient here from home with complaints of hyperglycemia, nausea, vomiting that started this morning. Type 1 diabetic.

## 2017-10-19 DIAGNOSIS — E1010 Type 1 diabetes mellitus with ketoacidosis without coma: Secondary | ICD-10-CM

## 2017-10-19 DIAGNOSIS — K2900 Acute gastritis without bleeding: Secondary | ICD-10-CM

## 2017-10-19 DIAGNOSIS — E876 Hypokalemia: Secondary | ICD-10-CM | POA: Diagnosis not present

## 2017-10-19 LAB — BASIC METABOLIC PANEL
ANION GAP: 9 (ref 5–15)
Anion gap: 5 (ref 5–15)
Anion gap: 8 (ref 5–15)
BUN: 9 mg/dL (ref 6–20)
BUN: 9 mg/dL (ref 6–20)
BUN: 10 mg/dL (ref 6–20)
CHLORIDE: 111 mmol/L (ref 101–111)
CHLORIDE: 111 mmol/L (ref 101–111)
CO2: 25 mmol/L (ref 22–32)
CO2: 21 mmol/L — AB (ref 22–32)
CO2: 22 mmol/L (ref 22–32)
CREATININE: 0.66 mg/dL (ref 0.61–1.24)
CREATININE: 0.6 mg/dL — AB (ref 0.61–1.24)
Calcium: 8.3 mg/dL — ABNORMAL LOW (ref 8.9–10.3)
Calcium: 8.5 mg/dL — ABNORMAL LOW (ref 8.9–10.3)
Calcium: 8.7 mg/dL — ABNORMAL LOW (ref 8.9–10.3)
Chloride: 107 mmol/L (ref 101–111)
Creatinine, Ser: 0.63 mg/dL (ref 0.61–1.24)
GFR calc Af Amer: >60 mL/min (ref >60)
GFR calc Af Amer: >60 mL/min (ref >60)
GFR calc Af Amer: >60 mL/min (ref >60)
GFR calc non Af Amer: >60 mL/min (ref >60)
GLUCOSE: 153 mg/dL — AB (ref 65–99)
GLUCOSE: 261 mg/dL — AB (ref 65–99)
GLUCOSE: 129 mg/dL — AB (ref 65–99)
POTASSIUM: 3.5 mmol/L (ref 3.5–5.1)
POTASSIUM: 3.3 mmol/L — AB (ref 3.5–5.1)
Potassium: 4.1 mmol/L (ref 3.5–5.1)
SODIUM: 141 mmol/L (ref 135–145)
SODIUM: 138 mmol/L (ref 135–145)
Sodium: 140 mmol/L (ref 135–145)

## 2017-10-19 LAB — GLUCOSE, CAPILLARY
GLUCOSE-CAPILLARY: 144 mg/dL — AB (ref 65–99)
GLUCOSE-CAPILLARY: 145 mg/dL — AB (ref 65–99)
GLUCOSE-CAPILLARY: 127 mg/dL — AB (ref 65–99)
GLUCOSE-CAPILLARY: 202 mg/dL — AB (ref 65–99)
GLUCOSE-CAPILLARY: 240 mg/dL — AB (ref 65–99)
GLUCOSE-CAPILLARY: 255 mg/dL — AB (ref 65–99)
Glucose-Capillary: 122 mg/dL — ABNORMAL HIGH (ref 65–99)
Glucose-Capillary: 135 mg/dL — ABNORMAL HIGH (ref 65–99)
Glucose-Capillary: 141 mg/dL — ABNORMAL HIGH (ref 65–99)
Glucose-Capillary: 168 mg/dL — ABNORMAL HIGH (ref 65–99)
Glucose-Capillary: 172 mg/dL — ABNORMAL HIGH (ref 65–99)
Glucose-Capillary: 178 mg/dL — ABNORMAL HIGH (ref 65–99)
Glucose-Capillary: 204 mg/dL — ABNORMAL HIGH (ref 65–99)
Glucose-Capillary: 215 mg/dL — ABNORMAL HIGH (ref 65–99)
Glucose-Capillary: 237 mg/dL — ABNORMAL HIGH (ref 65–99)

## 2017-10-19 LAB — CBC
HEMATOCRIT: 37.2 % — AB (ref 39.0–52.0)
Hemoglobin: 13.4 g/dL (ref 13.0–17.0)
MCH: 30.9 pg (ref 26.0–34.0)
MCHC: 36 g/dL (ref 30.0–36.0)
MCV: 85.7 fL (ref 78.0–100.0)
PLATELETS: 205 10*3/uL (ref 150–400)
RBC: 4.34 MIL/uL (ref 4.22–5.81)
RDW: 12.4 % (ref 11.5–15.5)
WBC: 16.9 10*3/uL — AB (ref 4.0–10.5)

## 2017-10-19 LAB — MAGNESIUM: Magnesium: 1.7 mg/dL (ref 1.7–2.4)

## 2017-10-19 MED ORDER — INSULIN DETEMIR 100 UNIT/ML ~~LOC~~ SOLN
10.0000 [IU] | Freq: Every day | SUBCUTANEOUS | Status: DC
  Administered 2017-10-19: 10 [IU] via SUBCUTANEOUS
  Filled 2017-10-19: qty 0.1

## 2017-10-19 MED ORDER — INSULIN ASPART 100 UNIT/ML ~~LOC~~ SOLN
3.0000 [IU] | Freq: Three times a day (TID) | SUBCUTANEOUS | Status: DC
  Administered 2017-10-19 – 2017-10-20 (×2): 3 [IU] via SUBCUTANEOUS

## 2017-10-19 MED ORDER — POTASSIUM CHLORIDE CRYS ER 20 MEQ PO TBCR
40.0000 meq | EXTENDED_RELEASE_TABLET | ORAL | Status: AC
Start: 2017-10-19 — End: 2017-10-19
  Administered 2017-10-19 (×2): 40 meq via ORAL
  Filled 2017-10-19 (×2): qty 2

## 2017-10-19 MED ORDER — INSULIN ASPART 100 UNIT/ML ~~LOC~~ SOLN
0.0000 [IU] | Freq: Three times a day (TID) | SUBCUTANEOUS | Status: DC
  Administered 2017-10-19 (×2): 3 [IU] via SUBCUTANEOUS
  Administered 2017-10-20: 1 [IU] via SUBCUTANEOUS

## 2017-10-19 MED ORDER — MAGNESIUM SULFATE 2 GM/50ML IV SOLN
2.0000 g | Freq: Once | INTRAVENOUS | Status: AC
  Administered 2017-10-19: 2 g via INTRAVENOUS
  Filled 2017-10-19: qty 50

## 2017-10-19 MED ORDER — INSULIN DETEMIR 100 UNIT/ML ~~LOC~~ SOLN
20.0000 [IU] | Freq: Every day | SUBCUTANEOUS | Status: DC
  Filled 2017-10-19: qty 0.2

## 2017-10-19 MED ORDER — INSULIN DETEMIR 100 UNIT/ML ~~LOC~~ SOLN
10.0000 [IU] | Freq: Two times a day (BID) | SUBCUTANEOUS | Status: AC
  Administered 2017-10-19: 10 [IU] via SUBCUTANEOUS
  Filled 2017-10-19: qty 0.1

## 2017-10-19 NOTE — Progress Notes (Signed)
Inpatient Diabetes Program Recommendations  AACE/ADA: New Consensus Statement on Inpatient Glycemic Control (2015)  Target Ranges:  Prepandial:   less than 140 mg/dL      Peak postprandial:   less than 180 mg/dL (1-2 hours)      Critically ill patients:  140 - 180 mg/dL   Lab Results  Component Value Date   GLUCAP 202 (H) 10/19/2017   HGBA1C 12.4 (H) 10/18/2017    Review of Glycemic Control  Diabetes history: DM1 Outpatient Diabetes medications: Levemir 20 units QHS, Novolog 4 units tidwc Current orders for Inpatient glycemic control: Transitioning off insulin drip - Levemir 10 units QD, Novolog 0-9 units tidwc  HgbA1C - 12.4%- uncontrolled  Inpatient Diabetes Program Recommendations:     Add HS correction Add Novolog 4 units tidwc for meal coverage insulin if pt eats > 50% meal Change Levemir to 10 units bid  Spoke with pt regarding his diabetes and glucose control. Discussed HgbA1C of 12.4% and goal of 7%. Discussed long and short term complications from uncontrolled blood sugars. Pt has Medicaid and should not have problems in getting his insulin. Pt has been counseled multiple times by Diabetes Coordinators in past several months.  Needs PCP to manage his diabetes. He has previously been to [facility] in the past and was instructed in March 2019 to make appt. Pt states he never made the appt.  Continue to follow.  Thank you. Ailene Ardshonda Eiko Mcgowen, RD, LDN, CDE Inpatient Diabetes Coordinator [phone]

## 2017-10-19 NOTE — Progress Notes (Signed)
PROGRESS NOTE  Shawn Maldonado RUE:[id number] DOB: [birth year] DOA: 10/18/2017 PCP: [facility], [facility]   LOS: 0 days   Brief Narrative / Interim history: 32 year old male with medical history significant for Type 1 DM on insulin. Presented to the ED last night with complaint of nausea and vomiting x 1 day after he ran out of insulin 2 days ago. Blood glucose was 390, CO2 18, AG 21, pH7.349. He was treated with IVF, IV insulin, and KCl in the ED then admitted to the hospital for management of DKA.   Assessment & Plan: Active Problems:   DKA, type 1 (HCC)  DKA, type 1 -Blood glucose 390 on admission, AG 18 -Treated with IVF and IV insulin drip -Blood glucose trended down over night, 178 this AM -Patient is no longer symptomatic, remained stable over night -Patient was on Levemir 20 U at bedtime and Novolog 4 U TID with meals -Switch to insulin sliding scale and IM Levemir 10 U this am, stop IV insulin drip 2 hours after first dose of levimir -D/C IVF, dextrose, cardiac monitoring -Carb modified diet  Anion gap metabolic acidosis, resolved -Anion gap 21, CO2 18, pH 7.349 on admission last night -Anion gap 5 this AM, patient remains stable  Hypokalemia -Potassium 3.3 this am -Replete with KCl 80 mEq PO  Hypomagnesemia -Magnesium 1.7 this AM -Replete with IV Magnesium sulfate 2g  DVT prophylaxis: Lovenox Code Status: Full code Family Communication: No family members at bedside Disposition Plan: Monitor for next 24 hours, if patient remains stable on IM insulin and sliding scale d/c tomorrow.   Subjective: Patient is resting well this morning. He states that he is feeling significantly better than he did last night. Denies nausea, vomiting, diarrhea, dizziness, chest pain, SOB.  Objective: Vitals:   10/19/17 0800 10/19/17 0900 10/19/17 1000 10/19/17 1100  BP: 125/67 120/71 113/69 127/67  Pulse: 73 78 68 79  Resp: 16 15 15 13   Temp:      TempSrc:      SpO2: 100% 100% 100%  99%  Weight:      Height:        Intake/Output Summary (Last 24 hours) at 10/19/2017 1141 Last data filed at 10/19/2017 1100 Gross per 24 hour  Intake 4179.39 ml  Output 2300 ml  Net 1879.39 ml   Filed Weights   10/18/17 1130  Weight: 58.4 kg (128 lb 12 oz)    Examination:  Constitutional: well developed, well nourished, NAD, non-toxic appearing Eyes: PERRL, lids and conjunctivae normal, sclera non-icteric Neck: normal, supple. Respiratory: clear to auscultation bilaterally, no wheezing, no crackles. Normal respiratory effort. No accessory muscle use.  Cardiovascular: Regular rate and rhythm, no appreciable murmurs / rubs / gallops. No LE edema.  Abdomen: soft, no distention, no tenderness.  Musculoskeletal: no clubbing / cyanosis. No joint deformity upper and lower extremities. No contractures. Normal muscle tone.  Skin: no rashes, lesions, ulcers. No induration Neurologic: Grossly nonfocal. Alert and oriented x 3. Psychiatric: Normal judgment and insight. Normal mood.    Data Reviewed: I have independently reviewed following labs and imaging studies.  CBC: Recent Labs  Lab 10/18/17 0957 10/19/17 0326  WBC 14.8* 16.9*  NEUTROABS 11.9*  --   HGB 15.4 13.4  HCT 43.1 37.2*  MCV 87.2 85.7  PLT 249 205   Basic Metabolic Panel: Recent Labs  Lab 10/18/17 1237 10/18/17 1533 10/18/17 1938 10/19/17 0004 10/19/17 0326  NA 142 141 141 140 141  K 4.2 4.3 3.9 3.5 3.3*  CL 109 111 108 111 111  CO2 15* 18* 21* 21* 25  GLUCOSE 281* 213* 167* 153* 129*  BUN 14 11 10 9 9   CREATININE 0.82 0.78 0.76 0.66 0.60*  CALCIUM 8.3* 8.4* 8.7* 8.5* 8.7*  MG 1.5*  --   --   --  1.7   GFR: Estimated Creatinine Clearance: 109.5 mL/min (A) (by C-G formula based on SCr of 0.6 mg/dL (L)). Liver Function Tests: No results for input(s): AST, ALT, ALKPHOS, BILITOT, PROT, ALBUMIN in the last 168 hours. No results for input(s): LIPASE, AMYLASE in the last 168 hours. No results for input(s):  AMMONIA in the last 168 hours. Coagulation Profile: No results for input(s): INR, PROTIME in the last 168 hours. Cardiac Enzymes: No results for input(s): CKTOTAL, CKMB, CKMBINDEX, TROPONINI in the last 168 hours. BNP (last 3 results) No results for input(s): PROBNP in the last 8760 hours. HbA1C: Recent Labs    10/18/17 1237  HGBA1C 12.4*   CBG: Recent Labs  Lab 10/19/17 0734 10/19/17 0838 10/19/17 0940 10/19/17 1046 10/19/17 1114  GLUCAP 178* 204* 237* 215* 202*   Lipid Profile: No results for input(s): CHOL, HDL, LDLCALC, TRIG, CHOLHDL, LDLDIRECT in the last 72 hours. Thyroid Function Tests: No results for input(s): TSH, T4TOTAL, FREET4, T3FREE, THYROIDAB in the last 72 hours. Anemia Panel: No results for input(s): VITAMINB12, FOLATE, FERRITIN, TIBC, IRON, RETICCTPCT in the last 72 hours. Urine analysis:    Component Value Date/Time   COLORURINE STRAW (A) 05/16/2017 2205   APPEARANCEUR CLEAR 05/16/2017 2205   LABSPEC 1.017 05/16/2017 2205   PHURINE 5.0 05/16/2017 2205   GLUCOSEU >=500 (A) 05/16/2017 2205   HGBUR SMALL (A) 05/16/2017 2205   BILIRUBINUR NEGATIVE 05/16/2017 2205   KETONESUR 80 (A) 05/16/2017 2205   PROTEINUR 30 (A) 05/16/2017 2205   UROBILINOGEN 0.2 09/24/2012 1037   NITRITE NEGATIVE 05/16/2017 2205   LEUKOCYTESUR NEGATIVE 05/16/2017 2205   Sepsis Labs: Invalid input(s): PROCALCITONIN, LACTICIDVEN  No results found for this or any previous visit (from the past 240 hour(s)).    Radiology Studies: No results found.   Scheduled Meds: . enoxaparin (LOVENOX) injection  40 mg Subcutaneous Q24H  . insulin aspart  0-9 Units Subcutaneous TID WC  . insulin detemir  10 Units Subcutaneous Daily  . metoCLOPramide  5 mg Oral TID AC & HS  . pantoprazole  40 mg Oral BID  . potassium chloride  40 mEq Oral Q4H  . sucralfate  1 g Oral TID   Cindee LameMelissa Kaela Beitz, PA-S [facility] Pager [phone]  If 7PM-7AM, please contact  night-coverage [email or web address] Password [email or web address] 10/19/2017, 11:41 AM

## 2017-10-19 NOTE — Progress Notes (Signed)
PROGRESS NOTE    Shawn Maldonado  ZOX:[id number] DOB: 09/24/1985 DOA: 10/18/2017 PCP: [facility]    Brief Narrative:  32 year old male who presented with nausea and vomiting.  He does have significant past medical history of type 1 diabetes mellitus.  Apparently he missed insulin therapy for 2 consecutive days, because he ran out of his medication.  On his initial physical examination blood pressure 133/87, heart rate 100, respiratory rate 18, temperature 97.7, oxygen saturation 98%.  His mucous membranes were dry, his lungs clear to auscultation bilaterally, heart S1-S2 present rhythmic, abdomen soft nontender, no lower extremity edema.  Sodium 141, potassium 4.1, chloride 102, bicarb 18, glucose 390, BUN 16, creatinine 0.87, anion gap 21, white count 14.8, hemoglobin 15.4, hematocrit 43.1, platelets 249.  Urinalysis greater than 500 glucose, protein 30, specific gravity 1.017, 0-5 white cells.  Patient was admitted to the hospital with a working diagnosis of diabetes ketoacidosis.   Assessment & Plan:   Active Problems:   DKA, type 1 (HCC)   1. Diabetes ketoacidosis. Anion gap has closed, patient with no further nausea or vomiting, will advance diet to regular and will transition to sq insulin. Patient at home insulin levimir 20 units plus, plus insulin aspart 3 units before meals. Insulin requirements over last 8 hours 12,9 units, with estimate 24 hours requirements of 38 units. 70% of 24 hours insulin requirements down to 27 units. Will place patient on 10 units bid of levimir and will continue insulin sliding scale for glucose coverage.   2. Gastritis. Continue antiacid therapy with pantoprazole and sucralfate.   3. Hypokalemia and hypomagnesemia. Will continue electrolyte correction with kcl and mg sulfate will follow on renal panel and electrolytes in am. Serum cr has been stable at 0,60. Serum bicarbonate at 25.   4. T1DM. Patient is able to afford insulin levimir, will need  close follow up as outpatient, old records personally reviewed, patient had frequent hospitalizations due to uncontrolled T1DM and DKA, HbA1c 12,4.   DVT prophylaxis: enoxaparin   Code Status: full Family Communication: no family at the bedside  Disposition Plan: home in am if stable glucose    Consultants:     Procedures:     Antimicrobials:       Subjective: Patient is feeling better, no nausea or vomiting, no chest pain or dyspnea.   Objective: Vitals:   10/19/17 0333 10/19/17 0400 10/19/17 0500 10/19/17 0600  BP:  (!) 113/40 (!) 96/47 114/61  Pulse:  72 77 67  Resp:  16 18 (!) 24  Temp: 98.2 F (36.8 C)     TempSrc: Oral     SpO2:  99% 99% 100%  Weight:      Height:        Intake/Output Summary (Last 24 hours) at 10/19/2017 0801 Last data filed at 10/18/2017 2000 Gross per 24 hour  Intake 3575.58 ml  Output 1500 ml  Net 2075.58 ml   Filed Weights   10/18/17 1130  Weight: 58.4 kg (128 lb 12 oz)    Examination:   General: Not in pain or dyspnea, deconditioned  Neurology: Awake and alert, non focal  E ENT: mild pallor, no icterus, oral mucosa moist Cardiovascular: No JVD. S1-S2 present, rhythmic, no gallops, rubs, or murmurs. No lower extremity edema. Pulmonary: vesicular breath sounds bilaterally, adequate air movement, no wheezing, rhonchi or rales. Gastrointestinal. Abdomen with no organomegaly, non tender, no rebound or guarding Skin. No rashes Musculoskeletal: no joint deformities     Data Reviewed: I  have personally reviewed following labs and imaging studies  CBC: Recent Labs  Lab 10/18/17 0957 10/19/17 0326  WBC 14.8* 16.9*  NEUTROABS 11.9*  --   HGB 15.4 13.4  HCT 43.1 37.2*  MCV 87.2 85.7  PLT 249 205   Basic Metabolic Panel: Recent Labs  Lab 10/18/17 1237 10/18/17 1533 10/18/17 1938 10/19/17 0004 10/19/17 0326  NA 142 141 141 140 141  K 4.2 4.3 3.9 3.5 3.3*  CL 109 111 108 111 111  CO2 15* 18* 21* 21* 25  GLUCOSE 281*  213* 167* 153* 129*  BUN 14 11 10 9 9   CREATININE 0.82 0.78 0.76 0.66 0.60*  CALCIUM 8.3* 8.4* 8.7* 8.5* 8.7*  MG 1.5*  --   --   --  1.7   GFR: Estimated Creatinine Clearance: 109.5 mL/min (A) (by C-G formula based on SCr of 0.6 mg/dL (L)). Liver Function Tests: No results for input(s): AST, ALT, ALKPHOS, BILITOT, PROT, ALBUMIN in the last 168 hours. No results for input(s): LIPASE, AMYLASE in the last 168 hours. No results for input(s): AMMONIA in the last 168 hours. Coagulation Profile: No results for input(s): INR, PROTIME in the last 168 hours. Cardiac Enzymes: No results for input(s): CKTOTAL, CKMB, CKMBINDEX, TROPONINI in the last 168 hours. BNP (last 3 results) No results for input(s): PROBNP in the last 8760 hours. HbA1C: Recent Labs    10/18/17 1237  HGBA1C 12.4*   CBG: Recent Labs  Lab 10/19/17 0423 10/19/17 0519 10/19/17 0629 10/19/17 0658 10/19/17 0734  GLUCAP 122* 135* 168* 172* 178*   Lipid Profile: No results for input(s): CHOL, HDL, LDLCALC, TRIG, CHOLHDL, LDLDIRECT in the last 72 hours. Thyroid Function Tests: No results for input(s): TSH, T4TOTAL, FREET4, T3FREE, THYROIDAB in the last 72 hours. Anemia Panel: No results for input(s): VITAMINB12, FOLATE, FERRITIN, TIBC, IRON, RETICCTPCT in the last 72 hours.    Radiology Studies: I have reviewed all of the imaging during this hospital visit personally     Scheduled Meds: . enoxaparin (LOVENOX) injection  40 mg Subcutaneous Q24H  . metoCLOPramide  5 mg Oral TID AC & HS  . pantoprazole  40 mg Oral BID  . sucralfate  1 g Oral TID   Continuous Infusions: . sodium chloride Stopped (10/18/17 1220)  . dextrose 5 % and 0.45% NaCl 125 mL/hr (10/18/17 1228)  . insulin (NOVOLIN-R) infusion 0.5 Units/hr (10/19/17 0615)     LOS: 0 days        Mauricio Annett Gulaaniel Arrien, MD [facility] Pager [phone]

## 2017-10-20 DIAGNOSIS — E1010 Type 1 diabetes mellitus with ketoacidosis without coma: Secondary | ICD-10-CM | POA: Diagnosis not present

## 2017-10-20 DIAGNOSIS — E876 Hypokalemia: Secondary | ICD-10-CM | POA: Diagnosis not present

## 2017-10-20 LAB — BASIC METABOLIC PANEL
Anion gap: 8 (ref 5–15)
BUN: 9 mg/dL (ref 6–20)
CO2: 27 mmol/L (ref 22–32)
CREATININE: 0.61 mg/dL (ref 0.61–1.24)
Calcium: 8.8 mg/dL — ABNORMAL LOW (ref 8.9–10.3)
Chloride: 107 mmol/L (ref 101–111)
GFR calc Af Amer: >60 mL/min (ref >60)
Glucose, Bld: 150 mg/dL — ABNORMAL HIGH (ref 65–99)
Potassium: 3.8 mmol/L (ref 3.5–5.1)
SODIUM: 142 mmol/L (ref 135–145)

## 2017-10-20 LAB — GLUCOSE, CAPILLARY: GLUCOSE-CAPILLARY: 143 mg/dL — AB (ref 65–99)

## 2017-10-20 LAB — MAGNESIUM: Magnesium: 1.9 mg/dL (ref 1.7–2.4)

## 2017-10-20 MED ORDER — INSULIN ASPART 100 UNIT/ML ~~LOC~~ SOLN: 4.0000 [IU] | Freq: Three times a day (TID) | SUBCUTANEOUS | 0 refills | Status: DC

## 2017-10-20 MED ORDER — INSULIN DETEMIR 100 UNIT/ML ~~LOC~~ SOLN: 20.0000 [IU] | Freq: Every day | SUBCUTANEOUS | 0 refills | Status: DC

## 2017-10-20 NOTE — Progress Notes (Signed)
Pt is discharged to home. Dc instructions given. No concerns voiced. Prescription for insulins x 2. Pt encouraged to make f/u appointments and emphasis placed on importance of appts. Voiced understanding. Left unit ambulatory accompanied by family. Left in stable condition. Derinda SisVera Maui Ahart,rn.

## 2017-10-20 NOTE — Discharge Summary (Signed)
Physician Discharge Summary  Shawn Maldonado:[id number] DOB: July 31, 1985 DOA: 10/18/2017  PCP: [facility]  Admit date: 10/18/2017 Discharge date: 10/20/2017  Admitted From: Home  Disposition:  Home  Recommendations for Outpatient Follow-up and new medication changes:  1. Follow up with [facility] in one week 2. Resume insulin levimir and aspart, new prescription given.   Home Health: no   Equipment/Devices: no    Discharge Condition: stable  CODE STATUS: full  Diet recommendation:  Diabetic prudent.   Brief/Interim Summary: 32 year old male who presented with nausea and vomiting.  He does have the significant past medical history of type 1 diabetes mellitus.  Apparently he missed insulin therapy for 2 consecutive days, because he ran out of his medication.  On his initial physical examination blood pressure 133/87, heart rate 100, respiratory rate 18, temperature 97.7, oxygen saturation 98%.  His mucous membranes were dry, his lungs were clear to auscultation bilaterally, heart S1-S2 present rhythmic, abdomen soft nontender, no lower extremity edema.  Sodium 141, potassium 4.1, chloride 102, bicarb 18, glucose 390, BUN 16, creatinine 0.87, anion gap 21, white count 14.8, hemoglobin 15.4, hematocrit 43.1, platelets 249.  Urinalysis greater than 500 glucose, protein 30, specific gravity 1.017, 0-5 white cells.  Patient was admitted to the hospital with a working diagnosis of diabetes ketoacidosis.  1.  Diabetes ketoacidosis.  Patient was admitted to the stepdown unit, he received isotonic IV fluids, continuous infusion of IV insulin, close monitoring of electrolytes,   acid-base balance and anion gap.  He responded well to medical therapy, his anion gap closed, his GI symptoms improved and his diet was advanced.  He was successfully transitioned to subcutaneous insulin with a fasting glucose on the day of discharge of 150 mg/ dl.  Patient will continue 20 units of  long-acting insulin/Levemir and 4 units of insulin aspart before meals.  He was advised to avoid interruption of insulin therapy.   2.  Hypokalemia and hypomagnesemia.  Electrolytes disturbances related to diabetes ketoacidosis, potassium and magnesium were corrected with potassium chloride and magnesium sulfate.  His kidney function remained stable, with a discharge creatinine of 0.61, potassium of 3.8 and magnesium of 1.9.  3.  Acute gastritis.  Patient was placed on antiacid therapy with sucralfate and pantoprazole with significant improvement of his symptoms.  4.  Type 1 diabetes mellitus.  Patient was advised to have a follow-up within 7 days, continue current insulin regimen of basal insulin/Levemir 20 units plus pre-meal  short-acting insulin/aspart 4 units.  His hemoglobin A1c was 12.4   Discharge Diagnoses:  Active Problems:   DKA, type 1 (HCC)    Discharge Instructions   Allergies as of 10/20/2017   No Known Allergies     Medication List    STOP taking these medications   pantoprazole 40 MG tablet Commonly known as:  PROTONIX   penicillin v potassium 500 MG tablet Commonly known as:  VEETID   sucralfate 1 GM/10ML suspension Commonly known as:  CARAFATE     TAKE these medications   insulin aspart 100 UNIT/ML injection Commonly known as:  novoLOG Inject 4 Units into the skin 3 (three) times daily with meals.   insulin detemir 100 UNIT/ML injection Commonly known as:  LEVEMIR Inject 0.2 mLs (20 Units total) into the skin at bedtime.       No Known Allergies  Consultations:     Procedures/Studies:  No results found.    Subjective: Patient is feeling better, no further nausea or vomiting, no chest  pain. Has been out of bed.   Discharge Exam: Vitals:   10/19/17 2043 10/20/17 0511  BP: 121/79 119/73  Pulse: 71 (!) 59  Resp: 18 18  Temp: 98.5 F (36.9 C) 97.6 F (36.4 C)  SpO2: 99% 100%   Vitals:   10/19/17 1100 10/19/17 1204 10/19/17 2043  10/20/17 0511  BP: 127/67 124/83 121/79 119/73  Pulse: 79 70 71 (!) 59  Resp: 13 17 18 18   Temp:  98.1 F (36.7 C) 98.5 F (36.9 C) 97.6 F (36.4 C)  TempSrc:  Oral Oral Oral  SpO2: 99% 100% 99% 100%  Weight:      Height:        General: Not in pain or dyspnea. Neurology: Awake and alert, non focal  E ENT: no pallor, no icterus, oral mucosa moist Cardiovascular: No JVD. S1-S2 present, rhythmic, no gallops, rubs, or murmurs. No lower extremity edema. Pulmonary: vesicular breath sounds bilaterally, adequate air movement, no wheezing, rhonchi or rales. Gastrointestinal. Abdomen flat, no organomegaly, non tender, no rebound or guarding Skin. No rashes Musculoskeletal: no joint deformities   The results of significant diagnostics from this hospitalization (including imaging, microbiology, ancillary and laboratory) are listed below for reference.     Microbiology: No results found for this or any previous visit (from the past 240 hour(s)).   Labs: BNP (last 3 results) No results for input(s): BNP in the last 8760 hours. Basic Metabolic Panel: Recent Labs  Lab 10/18/17 1237  10/18/17 1938 10/19/17 0004 10/19/17 0326 10/19/17 1606 10/20/17 0552  NA 142   < > 141 140 141 138 142  K 4.2   < > 3.9 3.5 3.3* 4.1 3.8  CL 109   < > 108 111 111 107 107  CO2 15*   < > 21* 21* 25 22 27   GLUCOSE 281*   < > 167* 153* 129* 261* 150*  BUN 14   < > 10 9 9 10 9   CREATININE 0.82   < > 0.76 0.66 0.60* 0.63 0.61  CALCIUM 8.3*   < > 8.7* 8.5* 8.7* 8.3* 8.8*  MG 1.5*  --   --   --  1.7  --  1.9   < > = values in this interval not displayed.   Liver Function Tests: No results for input(s): AST, ALT, ALKPHOS, BILITOT, PROT, ALBUMIN in the last 168 hours. No results for input(s): LIPASE, AMYLASE in the last 168 hours. No results for input(s): AMMONIA in the last 168 hours. CBC: Recent Labs  Lab 10/18/17 0957 10/19/17 0326  WBC 14.8* 16.9*  NEUTROABS 11.9*  --   HGB 15.4 13.4  HCT  43.1 37.2*  MCV 87.2 85.7  PLT 249 205   Cardiac Enzymes: No results for input(s): CKTOTAL, CKMB, CKMBINDEX, TROPONINI in the last 168 hours. BNP: Invalid input(s): POCBNP CBG: Recent Labs  Lab 10/19/17 1046 10/19/17 1114 10/19/17 1655 10/19/17 2228 10/20/17 0751  GLUCAP 215* 202* 240* 255* 143*   D-Dimer No results for input(s): DDIMER in the last 72 hours. Hgb A1c Recent Labs    10/18/17 1237  HGBA1C 12.4*   Lipid Profile No results for input(s): CHOL, HDL, LDLCALC, TRIG, CHOLHDL, LDLDIRECT in the last 72 hours. Thyroid function studies No results for input(s): TSH, T4TOTAL, T3FREE, THYROIDAB in the last 72 hours.  Invalid input(s): FREET3 Anemia work up No results for input(s): VITAMINB12, FOLATE, FERRITIN, TIBC, IRON, RETICCTPCT in the last 72 hours. Urinalysis    Component Value Date/Time   COLORURINE  STRAW (A) 05/16/2017 2205   APPEARANCEUR CLEAR 05/16/2017 2205   LABSPEC 1.017 05/16/2017 2205   PHURINE 5.0 05/16/2017 2205   GLUCOSEU >=500 (A) 05/16/2017 2205   HGBUR SMALL (A) 05/16/2017 2205   BILIRUBINUR NEGATIVE 05/16/2017 2205   KETONESUR 80 (A) 05/16/2017 2205   PROTEINUR 30 (A) 05/16/2017 2205   UROBILINOGEN 0.2 09/24/2012 1037   NITRITE NEGATIVE 05/16/2017 2205   LEUKOCYTESUR NEGATIVE 05/16/2017 2205   Sepsis Labs Invalid input(s): PROCALCITONIN,  WBC,  LACTICIDVEN Microbiology No results found for this or any previous visit (from the past 240 hour(s)).   Time coordinating discharge: 45 minutes  SIGNED:   Coralie Keens, MD  [facility] 10/20/2017, 9:26 AM Pager [phone]  If 7PM-7AM, please contact night-coverage [email or web address] Password [email or web address]

## 2018-07-27 ENCOUNTER — Encounter (HOSPITAL_COMMUNITY): Payer: Self-pay

## 2018-07-27 ENCOUNTER — Emergency Department (HOSPITAL_COMMUNITY): Payer: Medicaid Other

## 2018-07-27 ENCOUNTER — Other Ambulatory Visit: Payer: Self-pay

## 2018-07-27 ENCOUNTER — Inpatient Hospital Stay (HOSPITAL_COMMUNITY)
Admission: EM | Admit: 2018-07-27 | Discharge: 2018-07-30 | DRG: 639 | Disposition: A | Discharge status: Home / Self Care | Payer: Medicaid Other | Attending: Student | Admitting: Student

## 2018-07-27 DIAGNOSIS — E109 Type 1 diabetes mellitus without complications: Secondary | ICD-10-CM

## 2018-07-27 DIAGNOSIS — E1010 Type 1 diabetes mellitus with ketoacidosis without coma: Secondary | ICD-10-CM | POA: Diagnosis not present

## 2018-07-27 DIAGNOSIS — Z91120 Patient's intentional underdosing of medication regimen due to financial hardship: Secondary | ICD-10-CM

## 2018-07-27 DIAGNOSIS — Z9111 Patient's noncompliance with dietary regimen: Secondary | ICD-10-CM

## 2018-07-27 DIAGNOSIS — Z794 Long term (current) use of insulin: Secondary | ICD-10-CM

## 2018-07-27 DIAGNOSIS — Z8249 Family history of ischemic heart disease and other diseases of the circulatory system: Secondary | ICD-10-CM

## 2018-07-27 DIAGNOSIS — R030 Elevated blood-pressure reading, without diagnosis of hypertension: Secondary | ICD-10-CM | POA: Diagnosis present

## 2018-07-27 DIAGNOSIS — T383X6A Underdosing of insulin and oral hypoglycemic [antidiabetic] drugs, initial encounter: Secondary | ICD-10-CM | POA: Diagnosis present

## 2018-07-27 DIAGNOSIS — E1043 Type 1 diabetes mellitus with diabetic autonomic (poly)neuropathy: Secondary | ICD-10-CM | POA: Diagnosis present

## 2018-07-27 DIAGNOSIS — Z87891 Personal history of nicotine dependence: Secondary | ICD-10-CM

## 2018-07-27 DIAGNOSIS — Z823 Family history of stroke: Secondary | ICD-10-CM

## 2018-07-27 DIAGNOSIS — E1110 Type 2 diabetes mellitus with ketoacidosis without coma: Secondary | ICD-10-CM | POA: Diagnosis present

## 2018-07-27 DIAGNOSIS — K3184 Gastroparesis: Secondary | ICD-10-CM | POA: Diagnosis present

## 2018-07-27 DIAGNOSIS — E1042 Type 1 diabetes mellitus with diabetic polyneuropathy: Secondary | ICD-10-CM | POA: Diagnosis present

## 2018-07-27 LAB — BLOOD GAS, VENOUS
ACID-BASE DEFICIT: 19.8 mmol/L — AB (ref 0.0–2.0)
BICARBONATE: 10.3 mmol/L — AB (ref 20.0–28.0)
O2 Saturation: 68.9 %
PH VEN: 7.074 — AB (ref 7.250–7.430)
PO2 VEN: 46.3 mmHg — AB (ref 32.0–45.0)
Patient temperature: 98.6
pCO2, Ven: 36.9 mmHg — ABNORMAL LOW (ref 44.0–60.0)

## 2018-07-27 LAB — RAPID URINE DRUG SCREEN, HOSP PERFORMED
AMPHETAMINES: NOT DETECTED
Barbiturates: NOT DETECTED
Benzodiazepines: NOT DETECTED
Cocaine: NOT DETECTED
Opiates: NOT DETECTED
Tetrahydrocannabinol: NOT DETECTED

## 2018-07-27 LAB — CBC WITH DIFFERENTIAL/PLATELET
ABS IMMATURE GRANULOCYTES: 0.19 10*3/uL — AB (ref 0.00–0.07)
BASOS ABS: 0.1 10*3/uL (ref 0.0–0.1)
BASOS PCT: 0 %
Eosinophils Absolute: 0 10*3/uL (ref 0.0–0.5)
Eosinophils Relative: 0 %
HCT: 50.5 % (ref 39.0–52.0)
HEMOGLOBIN: 16.7 g/dL (ref 13.0–17.0)
Immature Granulocytes: 1 %
LYMPHS PCT: 5 %
Lymphs Abs: 1.3 10*3/uL (ref 0.7–4.0)
MCH: 31.1 pg (ref 26.0–34.0)
MCHC: 33.1 g/dL (ref 30.0–36.0)
MCV: 94 fL (ref 80.0–100.0)
Monocytes Absolute: 0.8 10*3/uL (ref 0.1–1.0)
Monocytes Relative: 3 %
NEUTROS PCT: 91 %
NRBC: 0 % (ref 0.0–0.2)
Neutro Abs: 22.2 10*3/uL — ABNORMAL HIGH (ref 1.7–7.7)
PLATELETS: 269 10*3/uL (ref 150–400)
RBC: 5.37 MIL/uL (ref 4.22–5.81)
RDW: 11.7 % (ref 11.5–15.5)
WBC: 24.6 10*3/uL — AB (ref 4.0–10.5)

## 2018-07-27 LAB — COMPREHENSIVE METABOLIC PANEL
ALBUMIN: 5.5 g/dL — AB (ref 3.5–5.0)
ALK PHOS: 90 U/L (ref 38–126)
ALT: 29 U/L (ref 0–44)
ANION GAP: 21 — AB (ref 5–15)
AST: 35 U/L (ref 15–41)
BUN: 17 mg/dL (ref 6–20)
CALCIUM: 9.3 mg/dL (ref 8.9–10.3)
CHLORIDE: 105 mmol/L (ref 98–111)
CO2: 11 mmol/L — AB (ref 22–32)
CREATININE: 1.13 mg/dL (ref 0.61–1.24)
GFR calc non Af Amer: >60 mL/min (ref >60)
GLUCOSE: 414 mg/dL — AB (ref 70–99)
Potassium: 5 mmol/L (ref 3.5–5.1)
SODIUM: 137 mmol/L (ref 135–145)
Total Bilirubin: 1.5 mg/dL — ABNORMAL HIGH (ref 0.3–1.2)
Total Protein: 9.2 g/dL — ABNORMAL HIGH (ref 6.5–8.1)

## 2018-07-27 LAB — CBG MONITORING, ED
Glucose-Capillary: 416 mg/dL — ABNORMAL HIGH (ref 70–99)
Glucose-Capillary: 366 mg/dL — ABNORMAL HIGH (ref 70–99)
Glucose-Capillary: 319 mg/dL — ABNORMAL HIGH (ref 70–99)
Glucose-Capillary: 249 mg/dL — ABNORMAL HIGH (ref 70–99)

## 2018-07-27 LAB — BASIC METABOLIC PANEL
Anion gap: 17 — ABNORMAL HIGH (ref 5–15)
BUN: 15 mg/dL (ref 6–20)
CO2: 10 mmol/L — ABNORMAL LOW (ref 22–32)
CREATININE: 1.08 mg/dL (ref 0.61–1.24)
Calcium: 8.5 mg/dL — ABNORMAL LOW (ref 8.9–10.3)
Chloride: 111 mmol/L (ref 98–111)
GFR calc Af Amer: >60 mL/min (ref >60)
GFR calc non Af Amer: >60 mL/min (ref >60)
Glucose, Bld: 304 mg/dL — ABNORMAL HIGH (ref 70–99)
Potassium: 5.8 mmol/L — ABNORMAL HIGH (ref 3.5–5.1)
SODIUM: 138 mmol/L (ref 135–145)

## 2018-07-27 LAB — GLUCOSE, CAPILLARY
Glucose-Capillary: 227 mg/dL — ABNORMAL HIGH (ref 70–99)
Glucose-Capillary: 176 mg/dL — ABNORMAL HIGH (ref 70–99)
Glucose-Capillary: 184 mg/dL — ABNORMAL HIGH (ref 70–99)
Glucose-Capillary: 177 mg/dL — ABNORMAL HIGH (ref 70–99)

## 2018-07-27 LAB — LIPASE, BLOOD: Lipase: 17 U/L (ref 11–51)

## 2018-07-27 LAB — MRSA PCR SCREENING: MRSA by PCR: NEGATIVE

## 2018-07-27 LAB — HEMOGLOBIN A1C
Hgb A1c MFr Bld: 12.6 % — ABNORMAL HIGH (ref 4.8–5.6)
Mean Plasma Glucose: 314.92 mg/dL

## 2018-07-27 MED ORDER — DEXTROSE-NACL 5-0.45 % IV SOLN
INTRAVENOUS | Status: DC
  Administered 2018-07-27 – 2018-07-28 (×2): via INTRAVENOUS

## 2018-07-27 MED ORDER — METOCLOPRAMIDE HCL 5 MG/ML IJ SOLN
10.0000 mg | Freq: Four times a day (QID) | INTRAMUSCULAR | Status: DC
  Administered 2018-07-27 – 2018-07-30 (×11): 10 mg via INTRAVENOUS
  Filled 2018-07-27 (×11): qty 2

## 2018-07-27 MED ORDER — POTASSIUM CHLORIDE 10 MEQ/100ML IV SOLN
10.0000 meq | INTRAVENOUS | Status: DC
  Filled 2018-07-27: qty 100

## 2018-07-27 MED ORDER — LORAZEPAM 2 MG/ML IJ SOLN
1.0000 mg | Freq: Once | INTRAMUSCULAR | Status: AC
  Administered 2018-07-27: 1 mg via INTRAVENOUS
  Filled 2018-07-27: qty 1

## 2018-07-27 MED ORDER — INSULIN REGULAR(HUMAN) IN NACL 100-0.9 UT/100ML-% IV SOLN
INTRAVENOUS | Status: DC
  Administered 2018-07-27: 5.7 [IU]/h via INTRAVENOUS

## 2018-07-27 MED ORDER — SODIUM CHLORIDE 0.9 % IV BOLUS
1500.0000 mL | Freq: Once | INTRAVENOUS | Status: AC
  Administered 2018-07-27: 1500 mL via INTRAVENOUS

## 2018-07-27 MED ORDER — PROMETHAZINE HCL 25 MG/ML IJ SOLN
12.5000 mg | Freq: Four times a day (QID) | INTRAMUSCULAR | Status: DC | PRN
  Administered 2018-07-28 (×2): 25 mg via INTRAVENOUS
  Filled 2018-07-27 (×2): qty 1

## 2018-07-27 MED ORDER — SODIUM CHLORIDE 0.9 % IV SOLN: INTRAVENOUS | Status: DC

## 2018-07-27 MED ORDER — POTASSIUM CHLORIDE 10 MEQ/100ML IV SOLN
10.0000 meq | Freq: Once | INTRAVENOUS | Status: AC
  Administered 2018-07-27: 10 meq via INTRAVENOUS
  Filled 2018-07-27: qty 100

## 2018-07-27 MED ORDER — DEXTROSE-NACL 5-0.45 % IV SOLN: INTRAVENOUS | Status: DC

## 2018-07-27 MED ORDER — ONDANSETRON HCL 4 MG/2ML IJ SOLN
4.0000 mg | Freq: Four times a day (QID) | INTRAMUSCULAR | Status: DC | PRN
  Administered 2018-07-28: 4 mg via INTRAVENOUS
  Filled 2018-07-27: qty 2

## 2018-07-27 MED ORDER — ENOXAPARIN SODIUM 40 MG/0.4ML ~~LOC~~ SOLN
40.0000 mg | SUBCUTANEOUS | Status: DC
  Administered 2018-07-27 – 2018-07-29 (×2): 40 mg via SUBCUTANEOUS
  Filled 2018-07-27 (×4): qty 0.4

## 2018-07-27 MED ORDER — METOCLOPRAMIDE HCL 5 MG/ML IJ SOLN
10.0000 mg | Freq: Once | INTRAMUSCULAR | Status: AC
  Administered 2018-07-27: 10 mg via INTRAVENOUS
  Filled 2018-07-27: qty 2

## 2018-07-27 MED ORDER — INSULIN REGULAR(HUMAN) IN NACL 100-0.9 UT/100ML-% IV SOLN
INTRAVENOUS | Status: DC
  Administered 2018-07-27: 3.1 [IU]/h via INTRAVENOUS
  Filled 2018-07-27: qty 100

## 2018-07-27 MED ORDER — SODIUM CHLORIDE 0.9 % IV SOLN
INTRAVENOUS | Status: AC
  Administered 2018-07-27: 999 mL/h via INTRAVENOUS

## 2018-07-27 NOTE — ED Notes (Signed)
Bed: BV69 Expected date:  Expected time:  Means of arrival:  Comments: 33 yo hyperglycemia

## 2018-07-27 NOTE — ED Triage Notes (Signed)
Patient presented to ed with complain of high blood sugar. Patient wake up this morning with nausea  Vomiting, dizziness and weakness. CBG per ems 440. Patient given NS 500 and Zofran 8 mg per ems.

## 2018-07-27 NOTE — H&P (Signed)
History and Physical    Jayd Gaschler BTD:[id number] DOB: November 09, 1985 DOA: 07/27/2018  PCP: [facility]  Patient coming from:  Home   Chief Complaint: DKA  HPI: Jayseon Nearhood is a 33 y.o. male with medical history significant of type 1 DM presenting with DKA.  Patient is currently somnolent and not conversant; in addition to stupor from DKA, he was recently given Ativan for agitation.  He does acknowledge non-compliance with his insulin.  HPI as per EDP:  Patient is a 33 year old male with a history of type 1 diabetes on insulin presenting today with abdominal pain and vomiting all day today. He states he thinks he messed up on how he is supposed to take his insulin yesterday and has not been following a diabetic diet. This morning his blood sugar was high and the abdominal pain had already started. He has been vomiting most in the morning. He denies any URI symptoms such as cough, congestion, fever or shortness of breath. He has not had any diarrhea. He denies excessive alcohol use. Patient was given 8 mg of Zofran in route and 500 mils of fluid prior to arrival by EMS.   ED Course:  DKA.  Type 1 DM.  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  Persistently vomiting.  PH 7.07, HCO3 10.  K+ 5.0.  Given 2L, IV insulin.  P109-115, BP ok.  Normal mental status.  Review of Systems: Unable to perform  Ambulatory Status:  Ambulates without assistance  Past Medical History:  Diagnosis Date  . Diabetes mellitus without complication [facility])     Past Surgical History:  Procedure Laterality Date  . MOUTH SURGERY      Social History   Socioeconomic History  . Marital status: Single    Spouse name: Not on file  . Number of children: 2  . Years of education: Not on file  . Highest education level: Not on file  Occupational History  . Occupation: Works in Banker.  Social Needs  . Financial resource strain: Not on file  . Food insecurity:    Worry: Not on file    Inability: Not on file   . Transportation needs:    Medical: Not on file    Non-medical: Not on file  Tobacco Use  . Smoking status: Former Smoker    Types: Cigarettes  . Smokeless tobacco: Never Used  Substance and Sexual Activity  . Alcohol use: Yes    Comment: social  . Drug use: Yes    Types: Marijuana  . Sexual activity: Never  Lifestyle  . Physical activity:    Days per week: Not on file    Minutes per session: Not on file  . Stress: Not on file  Relationships  . Social connections:    Talks on phone: Not on file    Gets together: Not on file    Attends religious service: Not on file    Active member of club or organization: Not on file    Attends meetings of clubs or organizations: Not on file    Relationship status: Not on file  . Intimate partner violence:    Fear of current or ex partner: Not on file    Emotionally abused: Not on file    Physically abused: Not on file    Forced sexual activity: Not on file  Other Topics Concern  . Not on file  Social History Narrative   Lives with girlfriend.      No Known Allergies  Family History  Problem Relation Age of Onset  . Stroke Father   . CAD Other     Prior to Admission medications   Medication Sig Start Date End Date Taking? Authorizing Provider  insulin aspart (NOVOLOG) 100 UNIT/ML injection Inject 4 Units into the skin 3 (three) times daily with meals. 10/20/17 11/19/17  Arrien, York Ram, MD  insulin detemir (LEVEMIR) 100 UNIT/ML injection Inject 0.2 mLs (20 Units total) into the skin at bedtime. 10/20/17 11/19/17  Coralie Keens, MD    Physical Exam: Vitals:   07/27/18 1500 07/27/18 1530 07/27/18 1600 07/27/18 1630  BP: (!) 145/92 (!) 145/75 (!) 146/84 129/82  Pulse: (!) 111 (!) 120 (!) 110 (!) 117  Resp: 12 (!) 21 (!) 22 (!) 22  Temp:      TempSrc:      SpO2: 100% 100% 100% 99%  Weight:      Height:         . General: Somnolent, minimally conversant . Eyes:  PERRL, EOMI, normal lids, iris . ENT:   grossly normal hearing, lips & tongue, dry mm . Neck:  no LAD, masses or thyromegaly . Cardiovascular:  RR with tachycardia, no m/r/g. No LE edema.  Marland Kitchen Respiratory:   CTA bilaterally with no wheezes/rales/rhonchi.  Normal respiratory effort. . Abdomen:  soft, NT, ND, NABS . Skin:  no rash or induration seen on limited exam . Musculoskeletal:  grossly normal tone BUE/BLE, good ROM, no bony abnormality . Psychiatric: agitated, stuporous, minimally conversant . Neurologic: unable to perform    Radiological Exams on Admission: Dg Chest Port 1 View  Result Date: 07/27/2018 CLINICAL DATA:  Hyperglycemia. Patient woke up this morning with nausea, vomiting, dizziness and weakness. H/o Diabetes. Former smoker. EXAM: PORTABLE CHEST 1 VIEW COMPARISON:  05/16/2017 FINDINGS: The heart size and mediastinal contours are within normal limits. Both lungs are clear. The visualized skeletal structures are unremarkable. IMPRESSION: No active disease. Electronically Signed   By: Gaylyn Rong M.D.   On: 07/27/2018 14:27    EKG: Independently reviewed.  NSR with rate 94; early repolarization with NSCSLT  Labs on Admission: I have personally reviewed the available labs and imaging studies at the time of the admission.  Pertinent labs:   VBG: pH 7.074/36.9/10.3 K+ 5.0 CO2 11 Glucose 414 Bili 1.5 WBC 24.6 A1c 12.4 in 6/19  Assessment/Plan Principal Problem:   Diabetic ketoacidosis without coma associated with type 1 diabetes mellitus (HCC)   DKA -Patient with poor baseline control; last A1c was 12.4 -He acknowledges recent non-compliance with insulin, which appears to be source of his DKA -No current indication of illness as source, but with WBC 24.6 will monitor -Moderate DKA on admission based on pH, HCO3 14.4; patient currently stuporous but he was also given Ativan prior to my evaluation -Will admit to SDU with DKA protocol -Would recommend continuing insulin drip at least until morning  regardless of rapidity of closure of gap and normalization of labs -K+ 5 at time of presentation and so potassium supplementation added -IVF at 150 cc/hr, NS until glucose <250 and then decrease rate to 125 and change to D51/2NS -Recheck A1c -Needs ongoing diabetes education -Will order UDS    DVT prophylaxis:  Lovenox  Code Status:  Full  Family Communication: None present  Disposition Plan:  Home once clinically improved Consults called: None  Admission status: It is my clinical opinion that referral for OBSERVATION is reasonable and necessary in this patient based on the above information provided. The aforementioned taken  together are felt to place the patient at high risk for further clinical deterioration. However it is anticipated that the patient may be medically stable for discharge from the hospital within 24 to 48 hours.    Jonah BlueJennifer Alda Gaultney MD [facility]   How to contact the [facility] Attending or Consulting provider 7A - 7P or covering provider during after hours 7P -7A, for this patient?  1. Check the care team in [facility] and look for a) attending/consulting [facility] provider listed and b) the [facility] team listed 2. Log into [email or web address] and use [place]'s universal password to access. If you do not have the password, please contact the hospital operator. 3. Locate the [facility] provider you are looking for under [facility] and page to a number that you can be directly reached. 4. If you still have difficulty reaching the provider, please page the [facility] (Director on Call) for the Hospitalists listed on amion for assistance.   07/27/2018, 4:44 PM

## 2018-07-27 NOTE — ED Provider Notes (Addendum)
[facility]-EMERGENCY DEPT Provider Note   CSN: [id number] Arrival date & time: 07/27/18  1328    History   Chief Complaint No chief complaint on file.   HPI Shawn Maldonado is a 33 y.o. male.     Patient is a 33 year old male with a history of type 1 diabetes on insulin presenting today with abdominal pain and vomiting all day today.  He states he thinks he messed up on how he is supposed to take his insulin yesterday and has not been following a diabetic diet.  This morning his blood sugar was high and the abdominal pain had already started.  He has been vomiting most in the morning.  He denies any URI symptoms such as cough, congestion, fever or shortness of breath.  He has not had any diarrhea.  He denies excessive alcohol use.  Patient was given 8 mg of Zofran in route and 500 mils of fluid prior to arrival by EMS.  The history is provided by the patient.  Emesis  Severity:  Severe Duration:  5 hours Timing:  Intermittent Number of daily episodes:  Numerous Quality:  Stomach contents Progression:  Worsening Chronicity:  Recurrent Recent urination:  Normal Relieved by:  Nothing Worsened by:  Nothing Ineffective treatments:  None tried Associated symptoms: abdominal pain   Associated symptoms: no cough, no diarrhea, no fever, no headaches, no myalgias, no sore throat and no URI   Risk factors: diabetes   Risk factors: no alcohol use and no prior abdominal surgery     Past Medical History:  Diagnosis Date  . Diabetes mellitus without complication [facility])     Patient Active Problem List   Diagnosis Date Noted  . Diabetic gastroparesis (HCC) 05/21/2017  . Leukocytosis 05/21/2017  . Transaminitis 05/21/2017  . Nausea & vomiting 05/17/2017  . AKI (acute kidney injury) (HCC) 05/17/2017  . Elevated LFTs 05/17/2017  . DKA, type 1 (HCC) 05/16/2017  . Hyperbilirubinemia 08/17/2015  . Intractable vomiting   . Type 1 diabetes mellitus with ketoacidosis  without coma (HCC)   . Type I diabetes mellitus (HCC) 08/14/2015  . Peripheral neuropathy 08/14/2015  . Diabetic ketoacidosis without coma associated with type 1 diabetes mellitus (HCC)   . Opiate withdrawal (HCC)   . Acute upper respiratory infection 04/30/2015  . Hypokalemia 09/25/2012  . Diabetes mellitus (HCC) 09/24/2012  . DKA (diabetic ketoacidoses) (HCC) 09/24/2012  . Oral thrush 09/24/2012    Past Surgical History:  Procedure Laterality Date  . MOUTH SURGERY          Home Medications    Prior to Admission medications   Medication Sig Start Date End Date Taking? Authorizing Provider  insulin aspart (NOVOLOG) 100 UNIT/ML injection Inject 4 Units into the skin 3 (three) times daily with meals. 10/20/17 11/19/17  Arrien, York Ram, MD  insulin detemir (LEVEMIR) 100 UNIT/ML injection Inject 0.2 mLs (20 Units total) into the skin at bedtime. 10/20/17 11/19/17  Arrien, York Ram, MD    Family History Family History  Problem Relation Age of Onset  . Stroke Father   . CAD Other     Social History Social History   Tobacco Use  . Smoking status: Former Smoker    Types: Cigarettes  . Smokeless tobacco: Never Used  Substance Use Topics  . Alcohol use: Yes    Comment: social  . Drug use: Yes    Types: Marijuana     Allergies   Patient has no known allergies.   Review  of Systems Review of Systems  Constitutional: Negative for fever.  HENT: Negative for sore throat.   Respiratory: Negative for cough.   Gastrointestinal: Positive for abdominal pain and vomiting. Negative for diarrhea.  Musculoskeletal: Negative for myalgias.  Neurological: Negative for headaches.  All other systems reviewed and are negative.    Physical Exam Updated Vital Signs BP 129/85 (BP Location: Left Arm)   Pulse (!) 109   Temp (!) 97.5 F (36.4 C) (Oral)   Resp (!) 24   Ht 5\' 9"  (1.753 m)   Wt 61.2 kg   SpO2 99%   BMI 19.94 kg/m   Physical Exam Vitals signs and  nursing note reviewed.  Constitutional:      General: He is in acute distress.     Appearance: He is well-developed.     Comments: Appears uncomfortable and smells of ketones  HENT:     Head: Normocephalic and atraumatic.     Nose: Nose normal.     Mouth/Throat:     Mouth: Mucous membranes are dry.  Eyes:     Conjunctiva/sclera: Conjunctivae normal.     Pupils: Pupils are equal, round, and reactive to light.  Neck:     Musculoskeletal: Normal range of motion and neck supple.  Cardiovascular:     Rate and Rhythm: Regular rhythm. Tachycardia present.     Heart sounds: No murmur.  Pulmonary:     Effort: Pulmonary effort is normal. Tachypnea present. No respiratory distress.     Breath sounds: Normal breath sounds. No wheezing or rales.  Abdominal:     General: There is no distension.     Palpations: Abdomen is soft.     Tenderness: There is abdominal tenderness. There is no guarding or rebound.     Comments: Mild diffuse tenderness but nothing localized  Musculoskeletal: Normal range of motion.        General: No tenderness.  Skin:    General: Skin is warm and dry.     Findings: No erythema or rash.  Neurological:     General: No focal deficit present.     Mental Status: He is alert and oriented to person, place, and time. Mental status is at baseline.  Psychiatric:        Mood and Affect: Mood normal.        Behavior: Behavior normal.        Thought Content: Thought content normal.      ED Treatments / Results  Labs (all labs ordered are listed, but only abnormal results are displayed) Labs Reviewed  CBC WITH DIFFERENTIAL/PLATELET - Abnormal; Notable for the following components:      Result Value   WBC 24.6 (*)    Neutro Abs 22.2 (*)    Abs Immature Granulocytes 0.19 (*)    All other components within normal limits  COMPREHENSIVE METABOLIC PANEL - Abnormal; Notable for the following components:   Glucose, Bld 414 (*)    Total Protein 9.2 (*)    Albumin 5.5 (*)     Total Bilirubin 1.5 (*)    All other components within normal limits  BLOOD GAS, VENOUS - Abnormal; Notable for the following components:   pH, Ven 7.074 (*)    pCO2, Ven 36.9 (*)    pO2, Ven 46.3 (*)    Bicarbonate 10.3 (*)    Acid-base deficit 19.8 (*)    All other components within normal limits  LIPASE, BLOOD    EKG EKG Interpretation  Date/Time:  Saturday July 27 2018 13:49:37 EDT Ventricular Rate:  94 PR Interval:    QRS Duration: 99 QT Interval:  358 QTC Calculation: 448 R Axis:   86 Text Interpretation:  Sinus rhythm Biatrial enlargement ST elev, probable normal early repol pattern No significant change since last tracing Confirmed by Gwyneth Sproutlunkett, Briley Bumgarner ([id number]) on 07/27/2018 2:01:57 PM   Radiology Dg Chest Port 1 View  Result Date: 07/27/2018 CLINICAL DATA:  Hyperglycemia. Patient woke up this morning with nausea, vomiting, dizziness and weakness. H/o Diabetes. Former smoker. EXAM: PORTABLE CHEST 1 VIEW COMPARISON:  05/16/2017 FINDINGS: The heart size and mediastinal contours are within normal limits. Both lungs are clear. The visualized skeletal structures are unremarkable. IMPRESSION: No active disease. Electronically Signed   By: Gaylyn RongWalter  Liebkemann M.D.   On: 07/27/2018 14:27    Procedures Procedures (including critical care time)  Medications Ordered in ED Medications  sodium chloride 0.9 % bolus 1,500 mL (has no administration in time range)  metoCLOPramide (REGLAN) injection 10 mg (has no administration in time range)     Initial Impression / Assessment and Plan / ED Course  I have reviewed the triage vital signs and the nursing notes.  Pertinent labs & imaging results that were available during my care of the patient were reviewed by me and considered in my medical decision making (see chart for details).       33 year old male presenting today with abdominal cramping and vomiting consistent with DKA as well as a smell of ketones.  Patient has not been  following a diabetic diet and states he did not take his insulin right yesterday.  This morning had a blood sugar in the 400s and did take a dose of his NovoLog.  However he has had abdominal pain and vomiting most in the morning.  He denies any infectious symptoms or other precursors to his symptoms.  Patient is tachycardic, tachypneic on exam.  He is are received 500 mL's of fluid by EMS but given another 1.5 L.  Labs are pending.  Chest x-ray and EKG pending.  2:52 PM Patient's EKG unchanged, leukocytosis of 24,000 on CBC which is most likely acute phase reaction, CMP with potassium of 5.0 so patient will need runs of potassium.  He has received a total of 2 L of fluid and will start the insulin drip as well as more fluid.  Lipase is within normal limits.  VBG with a pH of 7.07 and a bicarb of 10.  Unable to calculate the anion gap at this time because not all values are back in the CMP.  We will plan on admitting the patient for further care.  CRITICAL CARE Performed by: Trinton Prewitt Total critical care time: 30 minutes Critical care time was exclusive of separately billable procedures and treating other patients. Critical care was necessary to treat or prevent imminent or life-threatening deterioration. Critical care was time spent personally by me on the following activities: development of treatment plan with patient and/or surrogate as well as nursing, discussions with consultants, evaluation of patient's response to treatment, examination of patient, obtaining history from patient or surrogate, ordering and performing treatments and interventions, ordering and review of laboratory studies, ordering and review of radiographic studies, pulse oximetry and re-evaluation of patient's condition.   Final Clinical Impressions(s) / ED Diagnoses   Final diagnoses:  Diabetic ketoacidosis without coma associated with type 1 diabetes mellitus [facility](HCC)    ED Discharge Orders    None        Verbie Babic, Sugar NotchWhitney,  MD 07/27/18 1454    Gwyneth Sprout, MD 07/27/18 1455

## 2018-07-27 NOTE — ED Notes (Signed)
CBG 416- RN AWARE MD AWARE

## 2018-07-27 NOTE — Progress Notes (Addendum)
CRITICAL VALUE ALERT  Critical Value:  K+ 5.8  Date & Time Notied:  07-27-2018 @ 1930  Provider Notified: Bodenheimer  Orders Received/Actions taken: Monitor for now

## 2018-07-27 NOTE — ED Notes (Signed)
ED TO INPATIENT HANDOFF REPORT  ED Nurse Name and Phone #:  Larita Fife, RN [phone]  S Name/Age/Gender Shawn Maldonado 33 y.o. male Room/Bed: WA19/WA19  Code Status   Code Status: Full Code  Home/SNF/Other Home Patient oriented to: self, place, time and situation Is this baseline? Yes   Triage Complete: Triage complete  Chief Complaint Hyperglycemia  Triage Note Patient presented to ed with complain of high blood sugar. Patient wake up this morning with nausea  Vomiting, dizziness and weakness. CBG per ems 440. Patient given NS 500 and Zofran 8 mg per ems.   Allergies No Known Allergies  Level of Care/Admitting Diagnosis ED Disposition    ED Disposition Condition Comment   Admit  Hospital Area: [facility] [[id number]]  Level of Care: Stepdown [14]  Admit to SDU based on following criteria: Severe physiological/psychological symptoms:  Any diagnosis requiring assessment & intervention at least every 4 hours on an ongoing basis to obtain desired patient outcomes including stability and rehabilitation  Diagnosis: DKA (diabetic ketoacidosis) (HCC) [454098]  Admitting Physician: Jonah Blue [[id number]]  Attending Physician: Jonah Blue [[id number]]  PT Class (Do Not Modify): Observation [104]  PT Acc Code (Do Not Modify): Observation [10022]       B Medical/Surgery History Past Medical History:  Diagnosis Date  . Diabetes mellitus without complication [facility])    Past Surgical History:  Procedure Laterality Date  . MOUTH SURGERY       A IV Location/Drains/Wounds Patient Lines/Drains/Airways Status   Active Line/Drains/Airways    Name:   Placement date:   Placement time:   Site:   Days:   Peripheral IV 07/27/18 Left Forearm   07/27/18    -    Forearm   less than 1   Peripheral IV 07/27/18 Left;Upper Forearm   07/27/18    1415    Forearm   less than 1          Intake/Output Last 24 hours  Intake/Output Summary (Last 24 hours) at 07/27/2018 1705 Last  data filed at 07/27/2018 1609 Gross per 24 hour  Intake 1600 ml  Output -  Net 1600 ml    Labs/Imaging Results for orders placed or performed during the hospital encounter of 07/27/18 (from the past 48 hour(s))  CBG monitoring, ED     Status: Abnormal   Collection Time: 07/27/18  1:39 PM  Result Value Ref Range   Glucose-Capillary 416 (H) 70 - 99 mg/dL  CBC with Differential/Platelet     Status: Abnormal   Collection Time: 07/27/18  2:06 PM  Result Value Ref Range   WBC 24.6 (H) 4.0 - 10.5 K/uL   RBC 5.37 4.22 - 5.81 MIL/uL   Hemoglobin 16.7 13.0 - 17.0 g/dL   HCT 11.9 14.7 - 82.9 %   MCV 94.0 80.0 - 100.0 fL   MCH 31.1 26.0 - 34.0 pg   MCHC 33.1 30.0 - 36.0 g/dL   RDW 56.2 13.0 - 86.5 %   Platelets 269 150 - 400 K/uL   nRBC 0.0 0.0 - 0.2 %   Neutrophils Relative % 91 %   Neutro Abs 22.2 (H) 1.7 - 7.7 K/uL   Lymphocytes Relative 5 %   Lymphs Abs 1.3 0.7 - 4.0 K/uL   Monocytes Relative 3 %   Monocytes Absolute 0.8 0.1 - 1.0 K/uL   Eosinophils Relative 0 %   Eosinophils Absolute 0.0 0.0 - 0.5 K/uL   Basophils Relative 0 %   Basophils Absolute 0.1 0.0 -  0.1 K/uL   Immature Granulocytes 1 %   Abs Immature Granulocytes 0.19 (H) 0.00 - 0.07 K/uL    Comment: Performed at [facility] [address] [place], [place] [address]  Comprehensive metabolic panel     Status: Abnormal   Collection Time: 07/27/18  2:06 PM  Result Value Ref Range   Sodium 137 135 - 145 mmol/L   Potassium 5.0 3.5 - 5.1 mmol/L    Comment: SLIGHT HEMOLYSIS   Chloride 105 98 - 111 mmol/L   CO2 11 (L) 22 - 32 mmol/L   Glucose, Bld 414 (H) 70 - 99 mg/dL   BUN 17 6 - 20 mg/dL   Creatinine, Ser 6.81 0.61 - 1.24 mg/dL   Calcium 9.3 8.9 - 27.5 mg/dL   Total Protein 9.2 (H) 6.5 - 8.1 g/dL   Albumin 5.5 (H) 3.5 - 5.0 g/dL   AST 35 15 - 41 U/L   ALT 29 0 - 44 U/L   Alkaline Phosphatase 90 38 - 126 U/L   Total Bilirubin 1.5 (H) 0.3 - 1.2 mg/dL   GFR calc non Af Amer >60 >60 mL/min   GFR  calc Af Amer >60 >60 mL/min   Anion gap 21 (H) 5 - 15    Comment: REPEATED TO VERIFY Performed at [facility], [address] [place], [place] [address]   Lipase, blood     Status: None   Collection Time: 07/27/18  2:06 PM  Result Value Ref Range   Lipase 17 11 - 51 U/L    Comment: Performed at [facility], [address] [place], [place] [address]  Blood gas, venous (at [facility] and [facility])     Status: Abnormal   Collection Time: 07/27/18  2:41 PM  Result Value Ref Range   pH, Ven 7.074 (LL) 7.250 - 7.430    Comment: CRITICAL RESULT CALLED TO, READ BACK BY AND VERIFIED WITH: W.PLUNKETT, MD AT 1443 BY M.JESTER, RRT, RCP ON 07/28/2018    pCO2, Ven 36.9 (L) 44.0 - 60.0 mmHg   pO2, Ven 46.3 (H) 32.0 - 45.0 mmHg   Bicarbonate 10.3 (L) 20.0 - 28.0 mmol/L   Acid-base deficit 19.8 (H) 0.0 - 2.0 mmol/L   O2 Saturation 68.9 %   Patient temperature 98.6    Collection site VENOUS    Drawn by DRAWN BY RN    Sample type VENOUS     Comment: Performed at [facility], [address], [place], [place] [address]  CBG monitoring, ED     Status: Abnormal   Collection Time: 07/27/18  3:18 PM  Result Value Ref Range   Glucose-Capillary 366 (H) 70 - 99 mg/dL  CBG monitoring, ED     Status: Abnormal   Collection Time: 07/27/18  4:27 PM  Result Value Ref Range   Glucose-Capillary 319 (H) 70 - 99 mg/dL   Dg Chest Port 1 View  Result Date: 07/27/2018 CLINICAL DATA:  Hyperglycemia. Patient woke up this morning with nausea, vomiting, dizziness and weakness. H/o Diabetes. Former smoker. EXAM: PORTABLE CHEST 1 VIEW COMPARISON:  05/16/2017 FINDINGS: The heart size and mediastinal contours are within normal limits. Both lungs are clear. The visualized skeletal structures are unremarkable. IMPRESSION: No active disease. Electronically Signed   By: Gaylyn Rong M.D.   On: 07/27/2018 14:27    Pending Labs Unresulted Labs (From admission, onward)     Start     Ordered   07/27/18 1644  Hemoglobin  A1c  Once,   R     07/27/18 1645   07/27/18 1644  Urine rapid drug screen (hosp performed)  ONCE - STAT,   R     07/27/18 1645   07/27/18 1600  Basic metabolic panel  STAT Now then every 4 hours ,   STAT     07/27/18 1553   07/27/18 1552  HIV antibody (Routine Testing)  Once,   R     07/27/18 1553          Vitals/Pain Today's Vitals   07/27/18 1500 07/27/18 1530 07/27/18 1600 07/27/18 1630  BP: (!) 145/92 (!) 145/75 (!) 146/84 129/82  Pulse: (!) 111 (!) 120 (!) 110 (!) 117  Resp: 12 (!) 21 (!) 22 (!) 22  Temp:      TempSrc:      SpO2: 100% 100% 100% 99%  Weight:      Height:      PainSc:        Isolation Precautions No active isolations  Medications Medications  0.9 %  sodium chloride infusion (has no administration in time range)  0.9 %  sodium chloride infusion (has no administration in time range)  dextrose 5 %-0.45 % sodium chloride infusion (has no administration in time range)  insulin regular, human (MYXREDLIN) 100 units/ 100 mL infusion (has no administration in time range)  enoxaparin (LOVENOX) injection 40 mg (has no administration in time range)  potassium chloride 10 mEq in 100 mL IVPB ( Intravenous Restarted 07/27/18 1622)  sodium chloride 0.9 % bolus 1,500 mL (0 mLs Intravenous Stopped 07/27/18 1555)  metoCLOPramide (REGLAN) injection 10 mg (10 mg Intravenous Given 07/27/18 1410)  potassium chloride 10 mEq in 100 mL IVPB (0 mEq Intravenous Stopped 07/27/18 1609)  LORazepam (ATIVAN) injection 1 mg (1 mg Intravenous Given 07/27/18 1511)    Mobility walks     Focused Assessments DKA R Recommendations: See Admitting Provider Note  Report given to:   Additional Notes:  None

## 2018-07-28 ENCOUNTER — Encounter (HOSPITAL_COMMUNITY): Payer: Self-pay

## 2018-07-28 ENCOUNTER — Other Ambulatory Visit: Payer: Self-pay

## 2018-07-28 DIAGNOSIS — E1042 Type 1 diabetes mellitus with diabetic polyneuropathy: Secondary | ICD-10-CM | POA: Diagnosis present

## 2018-07-28 DIAGNOSIS — E1010 Type 1 diabetes mellitus with ketoacidosis without coma: Secondary | ICD-10-CM | POA: Diagnosis present

## 2018-07-28 DIAGNOSIS — E1069 Type 1 diabetes mellitus with other specified complication: Secondary | ICD-10-CM | POA: Diagnosis not present

## 2018-07-28 DIAGNOSIS — R030 Elevated blood-pressure reading, without diagnosis of hypertension: Secondary | ICD-10-CM | POA: Diagnosis present

## 2018-07-28 DIAGNOSIS — Z91120 Patient's intentional underdosing of medication regimen due to financial hardship: Secondary | ICD-10-CM | POA: Diagnosis not present

## 2018-07-28 DIAGNOSIS — Z87891 Personal history of nicotine dependence: Secondary | ICD-10-CM | POA: Diagnosis not present

## 2018-07-28 DIAGNOSIS — Z794 Long term (current) use of insulin: Secondary | ICD-10-CM | POA: Diagnosis not present

## 2018-07-28 DIAGNOSIS — Z8249 Family history of ischemic heart disease and other diseases of the circulatory system: Secondary | ICD-10-CM | POA: Diagnosis not present

## 2018-07-28 DIAGNOSIS — R112 Nausea with vomiting, unspecified: Secondary | ICD-10-CM | POA: Diagnosis present

## 2018-07-28 DIAGNOSIS — K3184 Gastroparesis: Secondary | ICD-10-CM | POA: Diagnosis present

## 2018-07-28 DIAGNOSIS — E785 Hyperlipidemia, unspecified: Secondary | ICD-10-CM | POA: Diagnosis not present

## 2018-07-28 DIAGNOSIS — Z823 Family history of stroke: Secondary | ICD-10-CM | POA: Diagnosis not present

## 2018-07-28 DIAGNOSIS — E1043 Type 1 diabetes mellitus with diabetic autonomic (poly)neuropathy: Secondary | ICD-10-CM | POA: Diagnosis present

## 2018-07-28 DIAGNOSIS — Z9111 Patient's noncompliance with dietary regimen: Secondary | ICD-10-CM | POA: Diagnosis not present

## 2018-07-28 DIAGNOSIS — E1065 Type 1 diabetes mellitus with hyperglycemia: Secondary | ICD-10-CM | POA: Diagnosis not present

## 2018-07-28 DIAGNOSIS — T383X6A Underdosing of insulin and oral hypoglycemic [antidiabetic] drugs, initial encounter: Secondary | ICD-10-CM | POA: Diagnosis present

## 2018-07-28 LAB — CBC WITH DIFFERENTIAL/PLATELET
Abs Immature Granulocytes: 0.11 10*3/uL — ABNORMAL HIGH (ref 0.00–0.07)
Basophils Absolute: 0 10*3/uL (ref 0.0–0.1)
Basophils Relative: 0 %
Eosinophils Absolute: 0 10*3/uL (ref 0.0–0.5)
Eosinophils Relative: 0 %
HCT: 38.3 % — ABNORMAL LOW (ref 39.0–52.0)
Hemoglobin: 13.3 g/dL (ref 13.0–17.0)
Immature Granulocytes: 1 %
Lymphocytes Relative: 6 %
Lymphs Abs: 1.1 10*3/uL (ref 0.7–4.0)
MCH: 31.3 pg (ref 26.0–34.0)
MCHC: 34.7 g/dL (ref 30.0–36.0)
MCV: 90.1 fL (ref 80.0–100.0)
Monocytes Absolute: 0.7 10*3/uL (ref 0.1–1.0)
Monocytes Relative: 4 %
Neutro Abs: 16.3 10*3/uL — ABNORMAL HIGH (ref 1.7–7.7)
Neutrophils Relative %: 89 %
Platelets: 228 10*3/uL (ref 150–400)
RBC: 4.25 MIL/uL (ref 4.22–5.81)
RDW: 11.9 % (ref 11.5–15.5)
WBC: 18.2 10*3/uL — ABNORMAL HIGH (ref 4.0–10.5)
nRBC: 0 % (ref 0.0–0.2)

## 2018-07-28 LAB — BASIC METABOLIC PANEL
Anion gap: 10 (ref 5–15)
Anion gap: 7 (ref 5–15)
Anion gap: 11 (ref 5–15)
Anion gap: 9 (ref 5–15)
Anion gap: 10 (ref 5–15)
BUN: 10 mg/dL (ref 6–20)
BUN: 11 mg/dL (ref 6–20)
BUN: 11 mg/dL (ref 6–20)
BUN: 10 mg/dL (ref 6–20)
BUN: 9 mg/dL (ref 6–20)
CO2: 16 mmol/L — ABNORMAL LOW (ref 22–32)
CO2: 16 mmol/L — ABNORMAL LOW (ref 22–32)
CO2: 16 mmol/L — ABNORMAL LOW (ref 22–32)
CO2: 17 mmol/L — AB (ref 22–32)
CO2: 18 mmol/L — ABNORMAL LOW (ref 22–32)
Calcium: 8.8 mg/dL — ABNORMAL LOW (ref 8.9–10.3)
Calcium: 9 mg/dL (ref 8.9–10.3)
Calcium: 8.8 mg/dL — ABNORMAL LOW (ref 8.9–10.3)
Calcium: 8.7 mg/dL — ABNORMAL LOW (ref 8.9–10.3)
Calcium: 9 mg/dL (ref 8.9–10.3)
Chloride: 113 mmol/L — ABNORMAL HIGH (ref 98–111)
Chloride: 116 mmol/L — ABNORMAL HIGH (ref 98–111)
Chloride: 112 mmol/L — ABNORMAL HIGH (ref 98–111)
Chloride: 113 mmol/L — ABNORMAL HIGH (ref 98–111)
Chloride: 112 mmol/L — ABNORMAL HIGH (ref 98–111)
Creatinine, Ser: 0.81 mg/dL (ref 0.61–1.24)
Creatinine, Ser: 0.69 mg/dL (ref 0.61–1.24)
Creatinine, Ser: 0.67 mg/dL (ref 0.61–1.24)
Creatinine, Ser: 0.71 mg/dL (ref 0.61–1.24)
Creatinine, Ser: 0.73 mg/dL (ref 0.61–1.24)
GFR calc Af Amer: >60 mL/min (ref >60)
GFR calc Af Amer: >60 mL/min (ref >60)
GFR calc Af Amer: >60 mL/min (ref >60)
GFR calc Af Amer: >60 mL/min (ref >60)
GFR calc Af Amer: >60 mL/min (ref >60)
GFR calc non Af Amer: >60 mL/min (ref >60)
GFR calc non Af Amer: >60 mL/min (ref >60)
GFR calc non Af Amer: >60 mL/min (ref >60)
GFR calc non Af Amer: >60 mL/min (ref >60)
Glucose, Bld: 191 mg/dL — ABNORMAL HIGH (ref 70–99)
Glucose, Bld: 126 mg/dL — ABNORMAL HIGH (ref 70–99)
Glucose, Bld: 234 mg/dL — ABNORMAL HIGH (ref 70–99)
Glucose, Bld: 227 mg/dL — ABNORMAL HIGH (ref 70–99)
Glucose, Bld: 228 mg/dL — ABNORMAL HIGH (ref 70–99)
Potassium: 3.8 mmol/L (ref 3.5–5.1)
Potassium: 3.6 mmol/L (ref 3.5–5.1)
Potassium: 3.8 mmol/L (ref 3.5–5.1)
Potassium: 3.6 mmol/L (ref 3.5–5.1)
Potassium: 3.5 mmol/L (ref 3.5–5.1)
SODIUM: 139 mmol/L (ref 135–145)
Sodium: 139 mmol/L (ref 135–145)
Sodium: 140 mmol/L (ref 135–145)
Sodium: 138 mmol/L (ref 135–145)
Sodium: 140 mmol/L (ref 135–145)

## 2018-07-28 LAB — GLUCOSE, CAPILLARY
GLUCOSE-CAPILLARY: 191 mg/dL — AB (ref 70–99)
Glucose-Capillary: 123 mg/dL — ABNORMAL HIGH (ref 70–99)
Glucose-Capillary: 109 mg/dL — ABNORMAL HIGH (ref 70–99)
Glucose-Capillary: 123 mg/dL — ABNORMAL HIGH (ref 70–99)
Glucose-Capillary: 168 mg/dL — ABNORMAL HIGH (ref 70–99)
Glucose-Capillary: 137 mg/dL — ABNORMAL HIGH (ref 70–99)
Glucose-Capillary: 104 mg/dL — ABNORMAL HIGH (ref 70–99)
Glucose-Capillary: 150 mg/dL — ABNORMAL HIGH (ref 70–99)
Glucose-Capillary: 187 mg/dL — ABNORMAL HIGH (ref 70–99)
Glucose-Capillary: 249 mg/dL — ABNORMAL HIGH (ref 70–99)

## 2018-07-28 LAB — LIPID PANEL
Cholesterol: 164 mg/dL (ref 0–200)
HDL: 58 mg/dL (ref >40)
LDL Cholesterol: 97 mg/dL (ref 0–99)
Total CHOL/HDL Ratio: 2.8 RATIO
Triglycerides: 43 mg/dL (ref <150)
VLDL: 9 mg/dL (ref 0–40)

## 2018-07-28 LAB — HIV ANTIBODY (ROUTINE TESTING W REFLEX): HIV Screen 4th Generation wRfx: NONREACTIVE

## 2018-07-28 MED ORDER — ALUM & MAG HYDROXIDE-SIMETH 200-200-20 MG/5ML PO SUSP
30.0000 mL | Freq: Four times a day (QID) | ORAL | Status: DC | PRN
  Administered 2018-07-28 – 2018-07-29 (×3): 30 mL via ORAL
  Filled 2018-07-28 (×3): qty 30

## 2018-07-28 MED ORDER — LISINOPRIL 5 MG PO TABS
5.0000 mg | ORAL_TABLET | Freq: Every day | ORAL | Status: DC
  Administered 2018-07-28 – 2018-07-30 (×3): 5 mg via ORAL
  Filled 2018-07-28: qty 2
  Filled 2018-07-28 (×2): qty 1

## 2018-07-28 MED ORDER — INSULIN GLARGINE 100 UNIT/ML ~~LOC~~ SOLN
20.0000 [IU] | Freq: Every day | SUBCUTANEOUS | Status: DC
  Administered 2018-07-28 – 2018-07-30 (×3): 20 [IU] via SUBCUTANEOUS
  Filled 2018-07-28 (×3): qty 0.2

## 2018-07-28 MED ORDER — SODIUM CHLORIDE 0.9 % IV SOLN
INTRAVENOUS | Status: DC
  Administered 2018-07-28 – 2018-07-29 (×3): via INTRAVENOUS

## 2018-07-28 MED ORDER — INSULIN ASPART 100 UNIT/ML ~~LOC~~ SOLN: 0.0000 [IU] | Freq: Every day | SUBCUTANEOUS | Status: DC

## 2018-07-28 MED ORDER — INSULIN ASPART 100 UNIT/ML ~~LOC~~ SOLN
0.0000 [IU] | Freq: Three times a day (TID) | SUBCUTANEOUS | Status: DC
  Administered 2018-07-28: 3 [IU] via SUBCUTANEOUS
  Administered 2018-07-29: 2 [IU] via SUBCUTANEOUS
  Administered 2018-07-29: 7 [IU] via SUBCUTANEOUS
  Administered 2018-07-29 – 2018-07-30 (×2): 2 [IU] via SUBCUTANEOUS
  Administered 2018-07-30: 3 [IU] via SUBCUTANEOUS

## 2018-07-28 MED ORDER — INSULIN ASPART 100 UNIT/ML ~~LOC~~ SOLN
4.0000 [IU] | Freq: Three times a day (TID) | SUBCUTANEOUS | Status: DC
  Administered 2018-07-29 – 2018-07-30 (×3): 4 [IU] via SUBCUTANEOUS

## 2018-07-28 MED ORDER — CHLORHEXIDINE GLUCONATE CLOTH 2 % EX PADS
6.0000 | MEDICATED_PAD | Freq: Every day | CUTANEOUS | Status: DC
  Administered 2018-07-28: 6 via TOPICAL

## 2018-07-28 NOTE — Plan of Care (Signed)
  Problem: Education: Goal: Knowledge of General Education information will improve Description Including pain rating scale, medication(s)/side effects and non-pharmacologic comfort measures Outcome: Progressing   Problem: Health Behavior/Discharge Planning: Goal: Ability to manage health-related needs will improve Outcome: Progressing   Problem: Clinical Measurements: Goal: Ability to maintain clinical measurements within normal limits will improve Outcome: Progressing Goal: Diagnostic test results will improve Outcome: Progressing Goal: Cardiovascular complication will be avoided Outcome: Progressing   Problem: Activity: Goal: Risk for activity intolerance will decrease Outcome: Progressing   Problem: Nutrition: Goal: Adequate nutrition will be maintained Outcome: Progressing   Problem: Coping: Goal: Level of anxiety will decrease Outcome: Progressing   Problem: Cardiac: Goal: Ability to maintain an adequate cardiac output will improve Outcome: Progressing   Problem: Health Behavior/Discharge Planning: Goal: Ability to identify and utilize available resources and services will improve Outcome: Progressing Goal: Ability to manage health-related needs will improve Outcome: Progressing   Problem: Nutritional: Goal: Maintenance of adequate nutrition will improve Outcome: Progressing Goal: Maintenance of adequate weight for body size and type will improve Outcome: Progressing   Problem: Respiratory: Goal: Will regain and/or maintain adequate ventilation Outcome: Progressing   Problem: Clinical Measurements: Goal: Will remain free from infection Outcome: Adequate for Discharge Goal: Respiratory complications will improve Outcome: Adequate for Discharge   Problem: Elimination: Goal: Will not experience complications related to bowel motility Outcome: Adequate for Discharge Goal: Will not experience complications related to urinary retention Outcome: Adequate for  Discharge   Problem: Pain Managment: Goal: General experience of comfort will improve Outcome: Adequate for Discharge   Problem: Safety: Goal: Ability to remain free from injury will improve Outcome: Adequate for Discharge   Problem: Skin Integrity: Goal: Risk for impaired skin integrity will decrease Outcome: Adequate for Discharge   Problem: Fluid Volume: Goal: Ability to achieve a balanced intake and output will improve Outcome: Adequate for Discharge   Problem: Urinary Elimination: Goal: Ability to achieve and maintain adequate renal perfusion and functioning will improve Outcome: Adequate for Discharge

## 2018-07-28 NOTE — Progress Notes (Signed)
PROGRESS NOTE    Shawn Maldonado  OFB:[id number] DOB: [birth year] DOA: 07/27/2018 PCP: [facility]  Outpatient Specialists:   Brief Narrative: Patient is a 33 year old male with past medical history significant for diabetes mellitus type 1.  According to the patient, he has had diabetes mellitus since the age of 62.  Patient is a poor historian.  Apparently, patient was not very compliant.  Anion gap has closed.  Will start patient on transition protocol.  Elevated blood pressure is also noted.  Will start patient on lisinopril 5 Mg p.o. once daily.  HbA1c done was 12.6%.  Will consult diabetic educator.  Will monitor patient overnight.  Likely, patient be discharged back on tomorrow.  Assessment & Plan:   Principal Problem:   Diabetic ketoacidosis without coma associated with type 1 diabetes mellitus (HCC)  Diabetic ketoacidosis in type I diabetic: Subcutaneous Lantus 20 units now, and discontinue insulin drip after 2 hours. Use transition protocol for now. Continue to monitor renal function and electrolytes. Consult diabetic educator Check lipid profile. Need to comply with diabetes management discussed with patient extensively. Hopefully, patient will be discharged in the morning.  Elevated blood pressure: Worrisome for hypertension. Continue to monitor closely. Lisinopril 5 mg p.o. once daily.  Leukocytosis: Likely reactive CBC plus differential now and tomorrow morning.  Noncompliance:  Counseled.  DVT prophylaxis: Subacute Lovenox Code Status: Full code Family Communication:  Disposition Plan: Home eventually   Consultants:   Diabetic educator  Procedures:   None  Antimicrobials:   None   Subjective: Poor historian No new complaints No fever or chills No chest pain No shortness of breath  Objective: Vitals:   07/28/18 0600 07/28/18 0700 07/28/18 0800 07/28/18 0827  BP: 133/61  (!) 144/83   Pulse: 99 (!) 111 (!) 106   Resp: 15 17 19     Temp:    98.5 F (36.9 C)  TempSrc:    Oral  SpO2: 100% 100% 100%   Weight:      Height:        Intake/Output Summary (Last 24 hours) at 07/28/2018 1025 Last data filed at 07/28/2018 7824 Gross per 24 hour  Intake 4246.29 ml  Output 850 ml  Net 3396.29 ml   Filed Weights   07/27/18 1351 07/27/18 1733 07/27/18 2000  Weight: 61.2 kg 55.3 kg 55.5 kg    Examination:  General exam: Appears calm and comfortable. Respiratory system: Clear to auscultation. Cardiovascular system: S1 & S2 heard Gastrointestinal system: Abdomen is nondistended, soft and nontender. No organomegaly or masses felt. Normal bowel sounds heard. Central nervous system: Alert and oriented.  Patient moves all limbs. Extremities: No leg edema.  Data Reviewed: I have personally reviewed following labs and imaging studies  CBC: Recent Labs  Lab 07/27/18 1406  WBC 24.6*  NEUTROABS 22.2*  HGB 16.7  HCT 50.5  MCV 94.0  PLT 269   Basic Metabolic Panel: Recent Labs  Lab 07/27/18 1406 07/27/18 1654 07/27/18 2349 07/28/18 0439 07/28/18 0903  NA 137 138 139 140 139  K 5.0 5.8* 3.8 3.6 3.8  CL 105 111 113* 116* 112*  CO2 11* 10* 16* 17* 16*  GLUCOSE 414* 304* 191* 126* 234*  BUN 17 15 10 11 11   CREATININE 1.13 1.08 0.81 0.69 0.67  CALCIUM 9.3 8.5* 8.8* 9.0 8.8*   GFR: Estimated Creatinine Clearance: 103.1 mL/min (by C-G formula based on SCr of 0.67 mg/dL). Liver Function Tests: Recent Labs  Lab 07/27/18 1406  AST 35  ALT 29  ALKPHOS 90  BILITOT 1.5*  PROT 9.2*  ALBUMIN 5.5*   Recent Labs  Lab 07/27/18 1406  LIPASE 17   No results for input(s): AMMONIA in the last 168 hours. Coagulation Profile: No results for input(s): INR, PROTIME in the last 168 hours. Cardiac Enzymes: No results for input(s): CKTOTAL, CKMB, CKMBINDEX, TROPONINI in the last 168 hours. BNP (last 3 results) No results for input(s): PROBNP in the last 8760 hours. HbA1C: Recent Labs    07/27/18 1644  HGBA1C 12.6*    CBG: Recent Labs  Lab 07/27/18 2143 07/28/18 0420 07/28/18 0529 07/28/18 0636 07/28/18 0739  GLUCAP 177* 123* 109* 123* 168*   Lipid Profile: No results for input(s): CHOL, HDL, LDLCALC, TRIG, CHOLHDL, LDLDIRECT in the last 72 hours. Thyroid Function Tests: No results for input(s): TSH, T4TOTAL, FREET4, T3FREE, THYROIDAB in the last 72 hours. Anemia Panel: No results for input(s): VITAMINB12, FOLATE, FERRITIN, TIBC, IRON, RETICCTPCT in the last 72 hours. Urine analysis:    Component Value Date/Time   COLORURINE STRAW (A) 05/16/2017 2205   APPEARANCEUR CLEAR 05/16/2017 2205   LABSPEC 1.017 05/16/2017 2205   PHURINE 5.0 05/16/2017 2205   GLUCOSEU >=500 (A) 05/16/2017 2205   HGBUR SMALL (A) 05/16/2017 2205   BILIRUBINUR NEGATIVE 05/16/2017 2205   KETONESUR 80 (A) 05/16/2017 2205   PROTEINUR 30 (A) 05/16/2017 2205   UROBILINOGEN 0.2 09/24/2012 1037   NITRITE NEGATIVE 05/16/2017 2205   LEUKOCYTESUR NEGATIVE 05/16/2017 2205   Sepsis Labs: @LABRCNTIP (procalcitonin:4,lacticidven:4)  ) Recent Results (from the past 240 hour(s))  MRSA PCR Screening     Status: None   Collection Time: 07/27/18  6:19 PM  Result Value Ref Range Status   MRSA by PCR NEGATIVE NEGATIVE Final    Comment:        The GeneXpert MRSA Assay (FDA approved for NASAL specimens only), is one component of a comprehensive MRSA colonization surveillance program. It is not intended to diagnose MRSA infection nor to guide or monitor treatment for MRSA infections. Performed at [facility], [address], [place], [place] [address]          Radiology Studies: Dg Chest Port 1 View  Result Date: 07/27/2018 CLINICAL DATA:  Hyperglycemia. Patient woke up this morning with nausea, vomiting, dizziness and weakness. H/o Diabetes. Former smoker. EXAM: PORTABLE CHEST 1 VIEW COMPARISON:  05/16/2017 FINDINGS: The heart size and mediastinal contours are within normal limits. Both lungs are  clear. The visualized skeletal structures are unremarkable. IMPRESSION: No active disease. Electronically Signed   By: Gaylyn Rong M.D.   On: 07/27/2018 14:27        Scheduled Meds: . Chlorhexidine Gluconate Cloth  6 each Topical Daily  . enoxaparin (LOVENOX) injection  40 mg Subcutaneous Q24H  . insulin aspart  0-5 Units Subcutaneous QHS  . insulin aspart  0-9 Units Subcutaneous TID WC  . insulin aspart  4 Units Subcutaneous TID WC  . insulin glargine  20 Units Subcutaneous Daily  . lisinopril  5 mg Oral Daily  . metoCLOPramide (REGLAN) injection  10 mg Intravenous Q6H   Continuous Infusions: . sodium chloride    . sodium chloride       LOS: 0 days    Time spent: 35 minutes   Berton Mount, MD  [facility] Pager #: [phone] 7PM-7AM contact night coverage as above

## 2018-07-29 DIAGNOSIS — R112 Nausea with vomiting, unspecified: Secondary | ICD-10-CM

## 2018-07-29 DIAGNOSIS — E1065 Type 1 diabetes mellitus with hyperglycemia: Secondary | ICD-10-CM

## 2018-07-29 LAB — CBC WITH DIFFERENTIAL/PLATELET
Abs Immature Granulocytes: 0.06 10*3/uL (ref 0.00–0.07)
Basophils Absolute: 0 10*3/uL (ref 0.0–0.1)
Basophils Relative: 0 %
Eosinophils Absolute: 0 10*3/uL (ref 0.0–0.5)
Eosinophils Relative: 0 %
HCT: 39.9 % (ref 39.0–52.0)
Hemoglobin: 13.1 g/dL (ref 13.0–17.0)
Immature Granulocytes: 0 %
Lymphocytes Relative: 16 %
Lymphs Abs: 2.3 10*3/uL (ref 0.7–4.0)
MCH: 30.5 pg (ref 26.0–34.0)
MCHC: 32.8 g/dL (ref 30.0–36.0)
MCV: 93 fL (ref 80.0–100.0)
Monocytes Absolute: 1.3 10*3/uL — ABNORMAL HIGH (ref 0.1–1.0)
Monocytes Relative: 9 %
Neutro Abs: 11.3 10*3/uL — ABNORMAL HIGH (ref 1.7–7.7)
Neutrophils Relative %: 75 %
Platelets: 206 10*3/uL (ref 150–400)
RBC: 4.29 MIL/uL (ref 4.22–5.81)
RDW: 12 % (ref 11.5–15.5)
WBC: 15.1 10*3/uL — ABNORMAL HIGH (ref 4.0–10.5)
nRBC: 0 % (ref 0.0–0.2)

## 2018-07-29 LAB — GLUCOSE, CAPILLARY
GLUCOSE-CAPILLARY: 175 mg/dL — AB (ref 70–99)
GLUCOSE-CAPILLARY: 135 mg/dL — AB (ref 70–99)
Glucose-Capillary: 151 mg/dL — ABNORMAL HIGH (ref 70–99)
Glucose-Capillary: 154 mg/dL — ABNORMAL HIGH (ref 70–99)
Glucose-Capillary: 161 mg/dL — ABNORMAL HIGH (ref 70–99)
Glucose-Capillary: 169 mg/dL — ABNORMAL HIGH (ref 70–99)
Glucose-Capillary: 170 mg/dL — ABNORMAL HIGH (ref 70–99)
Glucose-Capillary: 175 mg/dL — ABNORMAL HIGH (ref 70–99)
Glucose-Capillary: 188 mg/dL — ABNORMAL HIGH (ref 70–99)
Glucose-Capillary: 194 mg/dL — ABNORMAL HIGH (ref 70–99)
Glucose-Capillary: 194 mg/dL — ABNORMAL HIGH (ref 70–99)
Glucose-Capillary: 199 mg/dL — ABNORMAL HIGH (ref 70–99)
Glucose-Capillary: 233 mg/dL — ABNORMAL HIGH (ref 70–99)
Glucose-Capillary: 248 mg/dL — ABNORMAL HIGH (ref 70–99)
Glucose-Capillary: 328 mg/dL — ABNORMAL HIGH (ref 70–99)

## 2018-07-29 LAB — BASIC METABOLIC PANEL
Anion gap: 10 (ref 5–15)
BUN: 11 mg/dL (ref 6–20)
CO2: 20 mmol/L — ABNORMAL LOW (ref 22–32)
Calcium: 9 mg/dL (ref 8.9–10.3)
Chloride: 111 mmol/L (ref 98–111)
Creatinine, Ser: 0.57 mg/dL — ABNORMAL LOW (ref 0.61–1.24)
GFR calc Af Amer: >60 mL/min (ref >60)
GFR calc non Af Amer: >60 mL/min (ref >60)
Glucose, Bld: 231 mg/dL — ABNORMAL HIGH (ref 70–99)
Potassium: 3.8 mmol/L (ref 3.5–5.1)
Sodium: 141 mmol/L (ref 135–145)

## 2018-07-29 MED ORDER — LEVOTHYROXINE SODIUM 100 MCG PO TABS
ORAL_TABLET | ORAL | Status: AC
  Filled 2018-07-29: qty 1

## 2018-07-29 MED ORDER — PHENOL 1.4 % MT LIQD
1.0000 | OROMUCOSAL | Status: DC | PRN
  Filled 2018-07-29: qty 177

## 2018-07-29 MED ORDER — PANTOPRAZOLE SODIUM 40 MG PO TBEC
40.0000 mg | DELAYED_RELEASE_TABLET | Freq: Every day | ORAL | Status: DC
  Administered 2018-07-29 – 2018-07-30 (×2): 40 mg via ORAL
  Filled 2018-07-29 (×2): qty 1

## 2018-07-29 MED ORDER — ATORVASTATIN CALCIUM 10 MG PO TABS
20.0000 mg | ORAL_TABLET | Freq: Every day | ORAL | Status: DC
  Administered 2018-07-29: 20 mg via ORAL
  Filled 2018-07-29: qty 2

## 2018-07-29 NOTE — Progress Notes (Signed)
Inpatient Diabetes Program Recommendations  AACE/ADA: New Consensus Statement on Inpatient Glycemic Control (2015)  Target Ranges:  Prepandial:   less than 140 mg/dL      Peak postprandial:   less than 180 mg/dL (1-2 hours)      Critically ill patients:  140 - 180 mg/dL   Lab Results  Component Value Date   GLUCAP 169 (H) 07/29/2018   HGBA1C 12.6 (H) 07/27/2018    Review of Glycemic Control  Diabetes history: DM1 Outpatient Diabetes medications: Levemir 20 units QHS, Novolog 4 units tidwc Current orders for Inpatient glycemic control: Lantus 20 units QD, Novolog 0-9 units tidwc and hs + 4 units tidwc  HgbA1C - 12.6% - uncontrolled  Inpatient Diabetes Program Recommendations:     Talked with RN about seeing pt and discussing HgbA1C, insulin regimen at home, and problems with getting insulin. RN states pt is nauseated today and thought it would be better to talk with him in am.   Agree with orders.   Thank you. Ailene Ards, RD, LDN, CDE Inpatient Diabetes Coordinator [phone]

## 2018-07-29 NOTE — Progress Notes (Signed)
PROGRESS NOTE  Myles Slocumb PQA:[id number] DOB: October 29, 1985 DOA: 07/27/2018 PCP: [facility]   LOS: 1 day   Brief Narrative / Interim history: Patient is a 33 year old male with past medical history significant for diabetes mellitus type 1.  According to the patient, he has had diabetes mellitus since the age of 39.  Patient is a poor historian.  Apparently, patient was not very compliant.  DKA resolved on 3/29 and transition to subcu insulin.  Anion gap remains closed but patient continues to have emesis.   Subjective: Reports about 10 episodes of emesis since yesterday morning.  Last one this morning.  Emesis was nonbloody.  Also reports some epigastric pain.  Reports feeling weak and lethargic.  Denies chest pain or dyspnea.  Denies diarrhea  Assessment & Plan: Principal Problem:   Diabetic ketoacidosis without coma associated with type 1 diabetes mellitus (HCC) Active Problems:   DKA (diabetic ketoacidoses) (HCC)  DKA in patient with poorly controlled type 1 diabetes due to noncompliance with medication: A1c 12.6%.  DKA resolved transition to subcu insulin on 3/29.  Anion gap remains closed. -Continue Lantus 20 units daily. -Continue mealtime NovoLog 4 units 3 times daily. -Continue sliding scale insulin. -CBG monitoring -Start statin. -Continue IV normal saline at 75 cc/h given ongoing emesis. -Diabetic coordinator consulted.  Nausea/vomiting/epigastric pain: worrisome for gastropathy given poorly controlled diabetes. -Zofran and Reglan as needed -Try GI cocktail -Protonix 40 daily.  Elevated blood pressure: No history of hypertension.  Normal tensive this morning. -Lisinopril started given diabetes.  Leukocytosis: Likely demarginalization.  Improving.  Noncompliance with medication: says he has not seen his doctor in a while due to insurance.  He says he just got his insurance back.  He has been using [employer] brand insulin. -Counseling provided.  Scheduled Meds:  . enoxaparin (LOVENOX) injection  40 mg Subcutaneous Q24H  . insulin aspart  0-5 Units Subcutaneous QHS  . insulin aspart  0-9 Units Subcutaneous TID WC  . insulin aspart  4 Units Subcutaneous TID WC  . insulin glargine  20 Units Subcutaneous Daily  . lisinopril  5 mg Oral Daily  . metoCLOPramide (REGLAN) injection  10 mg Intravenous Q6H  . pantoprazole  40 mg Oral Daily   Continuous Infusions: . sodium chloride    . sodium chloride 75 mL/hr at 07/29/18 0007   PRN Meds:.alum & mag hydroxide-simeth  DVT prophylaxis: Subcu Lovenox Code Status: Full code Family Communication: None at bedside Disposition Plan: Remains inpatient given ongoing emesis.  Consultants:   None  Procedures:   None  Antimicrobials:  None  Objective: Vitals:   07/28/18 1500 07/28/18 2030 07/28/18 2356 07/29/18 0408  BP: (!) 151/76 109/67 113/67 122/80  Pulse: 87 79 68 75  Resp: 20 16 18 18   Temp:  98.8 F (37.1 C) 98.3 F (36.8 C) 98.4 F (36.9 C)  TempSrc:  Oral Oral Oral  SpO2: 100% 100% 99% 99%  Weight:      Height:        Intake/Output Summary (Last 24 hours) at 07/29/2018 1058 Last data filed at 07/29/2018 0912 Gross per 24 hour  Intake 1970.91 ml  Output 2350 ml  Net -379.09 ml   Filed Weights   07/27/18 1351 07/27/18 1733 07/27/18 2000  Weight: 61.2 kg 55.3 kg 55.5 kg    Examination:  GENERAL: Appears well.  Eating. EYES - vision grossly intact. Sclera anicteric.  NOSE- no gross deformity or drainage MOUTH - no oral lesions noted THROAT- no swelling or erythema LUNGS:  No IWOB. Good air movement. CTAB.  HEART:   RRR. Heart sounds normal. ABD: Bowel sounds present. Soft. Non tender.  MSK/EXT: Moves extremities. No obvious deformity. SKIN: no apparent skin lesion.  NEURO: Awake, alert and oriented appropriately.  No gross deficit.  PSYCH: Flat affect  Data Reviewed: I have independently reviewed following labs and imaging studies  CBC: Recent Labs  Lab 07/27/18  1406 07/28/18 1042 07/29/18 0239  WBC 24.6* 18.2* 15.1*  NEUTROABS 22.2* 16.3* 11.3*  HGB 16.7 13.3 13.1  HCT 50.5 38.3* 39.9  MCV 94.0 90.1 93.0  PLT 269 228 206   Basic Metabolic Panel: Recent Labs  Lab 07/28/18 0439 07/28/18 0903 07/28/18 1042 07/28/18 1839 07/29/18 0239  NA 140 139 138 140 141  K 3.6 3.8 3.6 3.5 3.8  CL 116* 112* 113* 112* 111  CO2 17* 16* 16* 18* 20*  GLUCOSE 126* 234* 227* 228* 231*  BUN CREATININE 0.69 0.67 0.71 0.73 0.57*  CALCIUM 9.0 8.8* 8.7* 9.0 9.0   GFR: Estimated Creatinine Clearance: 103.1 mL/min (A) (by C-G formula based on SCr of 0.57 mg/dL (L)). Liver Function Tests: Recent Labs  Lab 07/27/18 1406  AST 35  ALT 29  ALKPHOS 90  BILITOT 1.5*  PROT 9.2*  ALBUMIN 5.5*   Recent Labs  Lab 07/27/18 1406  LIPASE 17   No results for input(s): AMMONIA in the last 168 hours. Coagulation Profile: No results for input(s): INR, PROTIME in the last 168 hours. Cardiac Enzymes: No results for input(s): CKTOTAL, CKMB, CKMBINDEX, TROPONINI in the last 168 hours. BNP (last 3 results) No results for input(s): PROBNP in the last 8760 hours. HbA1C: Recent Labs    07/27/18 1644  HGBA1C 12.6*   CBG: Recent Labs  Lab 07/28/18 1632 07/28/18 2033 07/28/18 2359 07/29/18 0411 07/29/18 0726  GLUCAP 249* 191* 233* 248* 328*   Lipid Profile: Recent Labs    07/28/18 1042  CHOL 164  HDL 58  LDLCALC 97  TRIG 43  CHOLHDL 2.8   Thyroid Function Tests: No results for input(s): TSH, T4TOTAL, FREET4, T3FREE, THYROIDAB in the last 72 hours. Anemia Panel: No results for input(s): VITAMINB12, FOLATE, FERRITIN, TIBC, IRON, RETICCTPCT in the last 72 hours. Urine analysis:    Component Value Date/Time   COLORURINE STRAW (A) 05/16/2017 2205   APPEARANCEUR CLEAR 05/16/2017 2205   LABSPEC 1.017 05/16/2017 2205   PHURINE 5.0 05/16/2017 2205   GLUCOSEU >=500 (A) 05/16/2017 2205   HGBUR SMALL (A) 05/16/2017 2205   BILIRUBINUR  NEGATIVE 05/16/2017 2205   KETONESUR 80 (A) 05/16/2017 2205   PROTEINUR 30 (A) 05/16/2017 2205   UROBILINOGEN 0.2 09/24/2012 1037   NITRITE NEGATIVE 05/16/2017 2205   LEUKOCYTESUR NEGATIVE 05/16/2017 2205   Sepsis Labs: Invalid input(s): PROCALCITONIN, LACTICIDVEN  Recent Results (from the past 240 hour(s))  MRSA PCR Screening     Status: None   Collection Time: 07/27/18  6:19 PM  Result Value Ref Range Status   MRSA by PCR NEGATIVE NEGATIVE Final    Comment:        The GeneXpert MRSA Assay (FDA approved for NASAL specimens only), is one component of a comprehensive MRSA colonization surveillance program. It is not intended to diagnose MRSA infection nor to guide or monitor treatment for MRSA infections. Performed at [facility], [address], [place], [place] [address]       Radiology Studies: No results found.    Taye T. [facility]  Pager [phone]  If 7PM-7AM, please contact night-coverage [email or web address] Password [email or web address] 07/29/2018, 10:58 AM

## 2018-07-30 DIAGNOSIS — E1069 Type 1 diabetes mellitus with other specified complication: Secondary | ICD-10-CM

## 2018-07-30 DIAGNOSIS — E785 Hyperlipidemia, unspecified: Secondary | ICD-10-CM

## 2018-07-30 LAB — BASIC METABOLIC PANEL
Anion gap: 10 (ref 5–15)
BUN: 9 mg/dL (ref 6–20)
CO2: 23 mmol/L (ref 22–32)
Calcium: 8.5 mg/dL — ABNORMAL LOW (ref 8.9–10.3)
Chloride: 102 mmol/L (ref 98–111)
Creatinine, Ser: 0.52 mg/dL — ABNORMAL LOW (ref 0.61–1.24)
GFR calc Af Amer: >60 mL/min (ref >60)
GFR calc non Af Amer: >60 mL/min (ref >60)
Glucose, Bld: 229 mg/dL — ABNORMAL HIGH (ref 70–99)
Potassium: 3.2 mmol/L — ABNORMAL LOW (ref 3.5–5.1)
Sodium: 135 mmol/L (ref 135–145)

## 2018-07-30 LAB — CBC
HCT: 37.3 % — ABNORMAL LOW (ref 39.0–52.0)
Hemoglobin: 12.6 g/dL — ABNORMAL LOW (ref 13.0–17.0)
MCH: 30.6 pg (ref 26.0–34.0)
MCHC: 33.8 g/dL (ref 30.0–36.0)
MCV: 90.5 fL (ref 80.0–100.0)
Platelets: 170 10*3/uL (ref 150–400)
RBC: 4.12 MIL/uL — ABNORMAL LOW (ref 4.22–5.81)
RDW: 11.9 % (ref 11.5–15.5)
WBC: 11.3 10*3/uL — ABNORMAL HIGH (ref 4.0–10.5)
nRBC: 0 % (ref 0.0–0.2)

## 2018-07-30 LAB — GLUCOSE, CAPILLARY
Glucose-Capillary: 187 mg/dL — ABNORMAL HIGH (ref 70–99)
Glucose-Capillary: 242 mg/dL — ABNORMAL HIGH (ref 70–99)
Glucose-Capillary: 293 mg/dL — ABNORMAL HIGH (ref 70–99)

## 2018-07-30 LAB — MAGNESIUM: MAGNESIUM: 1.9 mg/dL (ref 1.7–2.4)

## 2018-07-30 MED ORDER — INSULIN GLARGINE 100 UNIT/ML ~~LOC~~ SOLN: 20.0000 [IU] | Freq: Every day | SUBCUTANEOUS | 0 refills | Status: DC

## 2018-07-30 MED ORDER — ZOLPIDEM TARTRATE 5 MG PO TABS
5.0000 mg | ORAL_TABLET | Freq: Every evening | ORAL | Status: DC | PRN
  Filled 2018-07-30: qty 1

## 2018-07-30 MED ORDER — POTASSIUM CHLORIDE CRYS ER 20 MEQ PO TBCR
40.0000 meq | EXTENDED_RELEASE_TABLET | ORAL | Status: AC
  Administered 2018-07-30 (×2): 40 meq via ORAL
  Filled 2018-07-30 (×2): qty 2

## 2018-07-30 MED ORDER — ONDANSETRON HCL 4 MG PO TABS: 4.0000 mg | ORAL_TABLET | Freq: Every day | ORAL | 0 refills | Status: DC | PRN

## 2018-07-30 MED ORDER — METOCLOPRAMIDE HCL 10 MG PO TABS: 10.0000 mg | ORAL_TABLET | Freq: Three times a day (TID) | ORAL | 0 refills | Status: DC | PRN

## 2018-07-30 MED ORDER — PANTOPRAZOLE SODIUM 40 MG PO TBEC: 40.0000 mg | DELAYED_RELEASE_TABLET | Freq: Every day | ORAL | 0 refills | Status: DC

## 2018-07-30 MED ORDER — LISINOPRIL 5 MG PO TABS: 5.0000 mg | ORAL_TABLET | Freq: Every day | ORAL | 0 refills | Status: DC

## 2018-07-30 MED ORDER — ATORVASTATIN CALCIUM 20 MG PO TABS: 20.0000 mg | ORAL_TABLET | Freq: Every day | ORAL | 0 refills | Status: DC

## 2018-07-30 MED ORDER — INSULIN ASPART 100 UNIT/ML ~~LOC~~ SOLN: 4.0000 [IU] | Freq: Three times a day (TID) | SUBCUTANEOUS | 0 refills | Status: DC

## 2018-07-30 NOTE — Discharge Summary (Signed)
Physician Discharge Summary  Shawn Maldonado QPY:[id number] DOB: 01/19/1986 DOA: 07/27/2018  PCP: [facility]  Admit date: 07/27/2018 Discharge date: 07/30/2018  Admitted From: Home Disposition: Home  Recommendations for Outpatient Follow-up:  1. Follow up with PCP in 1-2 weeks 2. Please obtain BMP/CBC in one week 3. Suggest evaluation for possible depression 4. Please follow up on the following pending results: None  Home Health: None Equipment/Devices: None  Discharge Condition: Stable CODE STATUS: Full code   Hospital Course: 33 year old male with history of type 1 diabetes since the age of 73 presenting with abdominal pain, emesis and altered mental status and found to be in severe DKA due to noncompliance.  He was started on insulin drip and IV fluid hydration per DKA protocol.  DKA resolved the next day.  He was transitioned to subcu insulin.  However, he continued to have nausea and vomiting.  He was started on Reglan and Zofran with resolution of his GI symptoms.  Patient's A1c was 12.6%.  On the day of discharge, symptoms resolved.  DKA resolved CBG fairly controlled.  Anion gap remained closed.  See individual problem list below for more.  Discharge Diagnoses:  Principal Problem:   Diabetic ketoacidosis without coma associated with type 1 diabetes mellitus (HCC) Active Problems:   DKA (diabetic ketoacidoses) (HCC)  DKA in patient with poorly controlled type 1 diabetes due to noncompliance with medication: A1c 12.6%.  DKA resolved transitioned to subcu insulin on 07/28/18.  Anion gap remains closed. -Discharged on Lantus 20 units daily, NovoLog 4 units 3 times a day plus sliding scale. -Started on statin and lisinopril as well. -Patient was evaluated and counseled by diabetic educator. -Recommend close follow-up for possible underlying mood issue.  Nausea/vomiting/epigastric pain: Likely due to DKA.  Cannot exclude gastropathy given poorly controlled diabetes.  -Was given prescription for Zofran and Reglan on discharge. -Also given prescription for Protonix given heartburn.  Leukocytosis: Likely demarginalization.  Improved.  Noncompliance with medication: says he has not seen his doctor in a while due to insurance.  He says he just got his insurance back.  May need close follow-up and evaluation for possible underlying mood issue.  Appears to have flat affect although he denies depression.   Discharge Instructions  Discharge Instructions    Call MD for:  persistant dizziness or light-headedness   Complete by:  As directed    Call MD for:  persistant nausea and vomiting   Complete by:  As directed    Diet Carb Modified   Complete by:  As directed    Increase activity slowly   Complete by:  As directed      Allergies as of 07/30/2018   No Known Allergies     Medication List    STOP taking these medications   insulin detemir 100 UNIT/ML injection Commonly known as:  LEVEMIR     TAKE these medications   atorvastatin 20 MG tablet Commonly known as:  LIPITOR Take 1 tablet (20 mg total) by mouth daily at 6 PM.   insulin aspart 100 UNIT/ML injection Commonly known as:  novoLOG Inject 4 Units into the skin 3 (three) times daily with meals for 30 days. Plus sliding scale as directed. What changed:  additional instructions   insulin glargine 100 UNIT/ML injection Commonly known as:  LANTUS Inject 0.2 mLs (20 Units total) into the skin daily.   lisinopril 5 MG tablet Commonly known as:  PRINIVIL,ZESTRIL Take 1 tablet (5 mg total) by mouth daily. Start taking  on:  July 31, 2018   metoCLOPramide 10 MG tablet Commonly known as:  REGLAN Take 1 tablet (10 mg total) by mouth every 8 (eight) hours as needed for nausea.   ondansetron 4 MG tablet Commonly known as:  Zofran Take 1 tablet (4 mg total) by mouth daily as needed for nausea or vomiting.   pantoprazole 40 MG tablet Commonly known as:  PROTONIX Take 1 tablet (40 mg total)  by mouth daily. Start taking on:  July 31, 2018      Follow-up Information    [facility]. Schedule an appointment as soon as possible for a visit in 1 week(s).   Specialty:  Pediatrics Contact information: [address] [place]urell Josephs [address] [place] [address] [phone]           Consultations:  Diabetic coordinator  Procedures/Studies:  2D Echo: None  Dg Chest Port 1 View  Result Date: 07/27/2018 CLINICAL DATA:  Hyperglycemia. Patient woke up this morning with nausea, vomiting, dizziness and weakness. H/o Diabetes. Former smoker. EXAM: PORTABLE CHEST 1 VIEW COMPARISON:  05/16/2017 FINDINGS: The heart size and mediastinal contours are within normal limits. Both lungs are clear. The visualized skeletal structures are unremarkable. IMPRESSION: No active disease. Electronically Signed   By: Gaylyn Rong M.D.   On: 07/27/2018 14:27      Subjective: No major events overnight of this morning.  No further emesis or nausea.  Denies abdominal pain, fever, chest pain, dyspnea or dysuria.  Feels ready to go home.  He denies depression.   Discharge Exam: Vitals:   07/29/18 2000 07/30/18 0555  BP: 134/82 139/85  Pulse: 84 63  Resp: 18 17  Temp: 98.4 F (36.9 C) 98.8 F (37.1 C)  SpO2: 100% 98%    GENERAL: Appears well. No acute distress.  HEENT: MMM.  Vision and Hearing grossly intact.  NECK: Supple.  No JVD.  LUNGS:  No IWOB. Good air movement. CTAB.  HEART:  RRR. Heart sounds normal.  ABD: Bowel sounds present. Soft. Non tender.  EXT:   no edema bilaterally.  SKIN: no apparent skin lesion.  NEURO: Awake, alert and oriented appropriately.  No gross deficit.  PSYCH: Flat affect.    The results of significant diagnostics from this hospitalization (including imaging, microbiology, ancillary and laboratory) are listed below for reference.     Microbiology: Recent Results (from the past 240 hour(s))  MRSA PCR Screening     Status: None   Collection  Time: 07/27/18  6:19 PM  Result Value Ref Range Status   MRSA by PCR NEGATIVE NEGATIVE Final    Comment:        The GeneXpert MRSA Assay (FDA approved for NASAL specimens only), is one component of a comprehensive MRSA colonization surveillance program. It is not intended to diagnose MRSA infection nor to guide or monitor treatment for MRSA infections. Performed at [facility], [address] [place], [place] [address]      Labs: BNP (last 3 results) No results for input(s): BNP in the last 8760 hours. Basic Metabolic Panel: Recent Labs  Lab 07/28/18 0903 07/28/18 1042 07/28/18 1839 07/29/18 0239 07/30/18 0437  NA 139 138 140 141 135  K 3.8 3.6 3.5 3.8 3.2*  CL 112* 113* 112* 111 102  CO2 16* 16* 18* 20* 23  GLUCOSE 234* 227* 228* 231* 229*  BUN 11 10 9 11 9   CREATININE 0.67 0.71 0.73 0.57* 0.52*  CALCIUM 8.8* 8.7* 9.0 9.0 8.5*  MG  --   --   --   --  1.9   Liver Function Tests: Recent Labs  Lab 07/27/18 1406  AST 35  ALT 29  ALKPHOS 90  BILITOT 1.5*  PROT 9.2*  ALBUMIN 5.5*   Recent Labs  Lab 07/27/18 1406  LIPASE 17   No results for input(s): AMMONIA in the last 168 hours. CBC: Recent Labs  Lab 07/27/18 1406 07/28/18 1042 07/29/18 0239 07/30/18 0437  WBC 24.6* 18.2* 15.1* 11.3*  NEUTROABS 22.2* 16.3* 11.3*  --   HGB 16.7 13.3 13.1 12.6*  HCT 50.5 38.3* 39.9 37.3*  MCV 94.0 90.1 93.0 90.5  PLT 269 228 206 170   Cardiac Enzymes: No results for input(s): CKTOTAL, CKMB, CKMBINDEX, TROPONINI in the last 168 hours. BNP: Invalid input(s): POCBNP CBG: Recent Labs  Lab 07/29/18 1145 07/29/18 1626 07/29/18 2115 07/30/18 0034 07/30/18 0751  GLUCAP 169* 161* 194* 293* 242*   D-Dimer No results for input(s): DDIMER in the last 72 hours. Hgb A1c Recent Labs    07/27/18 1644  HGBA1C 12.6*   Lipid Profile Recent Labs    07/28/18 1042  CHOL 164  HDL 58  LDLCALC 97  TRIG 43  CHOLHDL 2.8   Thyroid function  studies No results for input(s): TSH, T4TOTAL, T3FREE, THYROIDAB in the last 72 hours.  Invalid input(s): FREET3 Anemia work up No results for input(s): VITAMINB12, FOLATE, FERRITIN, TIBC, IRON, RETICCTPCT in the last 72 hours. Urinalysis    Component Value Date/Time   COLORURINE STRAW (A) 05/16/2017 2205   APPEARANCEUR CLEAR 05/16/2017 2205   LABSPEC 1.017 05/16/2017 2205   PHURINE 5.0 05/16/2017 2205   GLUCOSEU >=500 (A) 05/16/2017 2205   HGBUR SMALL (A) 05/16/2017 2205   BILIRUBINUR NEGATIVE 05/16/2017 2205   KETONESUR 80 (A) 05/16/2017 2205   PROTEINUR 30 (A) 05/16/2017 2205   UROBILINOGEN 0.2 09/24/2012 1037   NITRITE NEGATIVE 05/16/2017 2205   LEUKOCYTESUR NEGATIVE 05/16/2017 2205   Sepsis Labs Invalid input(s): PROCALCITONIN,  WBC,  LACTICIDVEN   Time coordinating discharge: 35 minutes  SIGNED:  Almon Herculesaye T , MD  [facility] 07/30/2018, 9:28 AM Pager [phone]  If 7PM-7AM, please contact night-coverage [email or web address] Password [email or web address]

## 2018-07-30 NOTE — TOC Transition Note (Signed)
Transition of Care [facility]) - CM/SW Discharge Note   Patient Details  Name: Shawn Maldonado MRN: [id number] Date of Birth: Apr 23, 1986  Transition of Care [facility]) CM/SW Contact:  Golda Acre, RN Phone Number: 07/30/2018, 11:01 AM   Clinical Narrative:    Discharged to home with self-care, orders checked for hhc needs. No TOC needs present at time of discharge.  Patient is able to arrangement own appointments and home care.   Final next level of care: Home/Self Care Barriers to Discharge: No Barriers Identified   Patient Goals and CMS Choice Patient states their goals for this hospitalization and ongoing recovery are:: just to go home      Discharge Placement                       Discharge Plan and Services   Discharge Planning Services: CM Consult                      Social Determinants of Health (SDOH) Interventions     Readmission Risk Interventions No flowsheet data found.

## 2018-07-30 NOTE — Discharge Instructions (Addendum)
It has been a pleasure taking care of you! -You were admitted with diabetic ketoacidosis (see below for more).  -Please follow the directions on your insulin regimen and your other medications. See sliding scale insulin chart below. The sliding scale is in addition to your Lantus and meal time Novolog.     Once you are discharged, your primary care physician will handle any further medical issues. Please note that NO REFILLS for any discharge medications will be authorized once you are discharged, as it is imperative that you return to your primary care physician (or establish a relationship with a primary care physician if you do not have one) for your aftercare needs so that they can reassess your need for medications and monitor your lab values. Take care,   Diabetic Ketoacidosis Diabetic ketoacidosis is a serious complication of diabetes. This condition develops when there is not enough insulin in the body. Insulin is an hormone that regulates blood sugar levels in the body. Normally, insulin allows glucose to enter the cells in the body. The cells break down glucose for energy. Without enough insulin, the body cannot break down glucose, so it breaks down fats instead. This leads to high blood glucose levels in the body and the production of acids that are called ketones. Ketones are poisonous at high levels. If diabetic ketoacidosis is not treated, it can cause severe dehydration and can lead to a coma or death. What are the causes? This condition develops when a lack of insulin causes the body to break down fats instead of glucose. This may be triggered by:  Stress on the body. This stress is brought on by an illness.  Infection.  Medicines that raise blood glucose levels.  Not taking diabetes medicine.  New onset of type 1 diabetes mellitus. What are the signs or symptoms? Symptoms of this condition include:  Fatigue.  Weight loss.  Excessive  thirst.  Light-headedness.  Fruity or sweet-smelling breath.  Excessive urination.  Vision changes.  Confusion or irritability.  Nausea.  Vomiting.  Rapid breathing.  Abdominal pain.  Feeling flushed. How is this diagnosed? This condition is diagnosed based on your medical history, a physical exam, and blood tests. You may also have a urine test to check for ketones. How is this treated? This condition may be treated with:  Fluid replacement. This may be done to correct dehydration.  Insulin injections. These may be given through the skin or through an IV tube.  Electrolyte replacement. Electrolytes are minerals in your blood. Electrolytes such as potassium and sodium may be given in pill form or through an IV tube.  Antibiotic medicines. These may be prescribed if your condition was caused by an infection. Diabetic ketoacidosis is a serious medical condition. You may need emergency treatment in the hospital to monitor your condition. Follow these instructions at home: Eating and drinking  Drink enough fluids to keep your urine clear or pale yellow.  If you are not able to eat, drink clear fluids in small amounts as you are able. Clear fluids include water, ice chips, fruit juice with water added (diluted), and low-calorie sports drinks. You may also have sugar-free jello or popsicles.  If you are able to eat, follow your usual diet and drink sugar-free liquids, such as water. Medicines  Take over-the-counter and prescription medicines only as told by your health care provider.  Continue to take insulin and other diabetes medicines as told by your health care provider.  If you were prescribed an antibiotic,  take it as told by your health care provider. Do not stop taking the antibiotic even if you start to feel better. General instructions   Check your urine for ketones when you are ill and as told by your health care provider. ? If your blood glucose is 240 mg/dL  (16.1 mmol/L) or higher, check your urine ketones every 4-6 hours.  Check your blood glucose every day, as often as told by your health care provider. ? If your blood glucose is high, drink plenty of fluids. This helps to flush out ketones. ? If your blood glucose is above your target for 2 tests in a row, contact your health care provider.  Carry a medical alert card or wear medical alert jewelry that says that you have diabetes.  Rest and exercise only as told by your health care provider. Do not exercise when your blood glucose is high and you have ketones in your urine.  If you get sick, call your health care provider and begin treatment quickly. Your body often needs extra insulin to fight an illness. Check your blood glucose every 4-6 hours when you are sick.  Keep all follow-up visits as told by your health care provider. This is important. Contact a health care provider if:  Your blood glucose level is higher than 240 mg/dL (09.6 mmol/L) for 2 days in a row.  You have moderate or large ketones in your urine.  You have a fever.  You cannot eat or drink without vomiting.  You have been vomiting for more than 2 hours.  You continue to have symptoms of diabetic ketoacidosis.  You develop new symptoms. Get help right away if:  Your blood glucose monitor reads high even when you are taking insulin.  You faint.  You have chest pain.  You have trouble breathing.  You have sudden trouble speaking or swallowing.  You have vomiting or diarrhea that gets worse after 3 hours.  You are unable to stay awake.  You have trouble thinking.  You are severely dehydrated. Symptoms of severe dehydration include: ? Extreme thirst. ? Dry mouth. ? Rapid breathing. These symptoms may represent a serious problem that is an emergency. Do not wait to see if the symptoms will go away. Get medical help right away. Call your local emergency services (911 in the [place] Do not drive yourself  to the hospital. Summary  Diabetic ketoacidosis is a serious complication of diabetes. This condition develops when there is not enough insulin in the body.  This condition is diagnosed based on your medical history, a physical exam, and blood tests. You may also have a urine test to check for ketones.  Diabetic ketoacidosis is a serious medical condition. You may need emergency treatment in the hospital to monitor your condition.  Contact your health care provider if your blood glucose is higher than 240 mg/dl for 2 days in a row or if you have moderate or large ketones in your urine. This information is not intended to replace advice given to you by your health care provider. Make sure you discuss any questions you have with your health care provider. Document Released: 04/14/2000 Document Revised: 05/22/2016 Document Reviewed: 05/22/2016 Elsevier Interactive Patient Education  2019 [employer]   Type 1 Diabetes Mellitus, Diagnosis, Adult  Type 1 diabetes (type 1 diabetes mellitus) is a long-term (chronic) disease. It happens when your pancreas does not make enough of a hormone called insulin. Insulin lets sugars (glucose) go into cells in your body.  This gives you energy. If your body does not make enough insulin, sugars cannot get into cells. This causes high blood sugar (hyperglycemia). Your doctor will set treatment goals for you. Generally, you should have these blood sugar levels:  Before meals (preprandial): 80-130 mg/dL (1.0-3.1 mmol/L).  After meals (postprandial): below 180 mg/dL (10 mmol/L).  A1c (hemoglobin A1c) level: less than 7%. Follow these instructions at home: Questions to ask your doctor  You may want to ask these questions: ? Do I need to meet with a diabetes educator? ? Where can I find a support group for people with diabetes? ? What equipment will I need to care for myself at home? ? What diabetes medicines do I need? When should I take them? ? How often  do I need to check my blood sugar? ? What number can I call if I have questions? ? When is my next doctor's visit? General instructions  Take over-the-counter and prescription medicines only as told by your doctor.  Keep all follow-up visits as told by your doctor. This is important. Contact a doctor if:  Your blood sugar is at or above 240 mg/dL (59.4 mmol/L) for 2 days in a row.  You have been sick for 2 days or more, and you are not getting better.  You have had a fever for 2 days or more, and you are not getting better.  You have any of these problems for more than 6 hours: ? You cannot eat or drink. ? You feel sick to your stomach (nauseous). ? You throw up (vomit). ? You have watery poop (diarrhea). Get help right away if:  Your blood sugar is lower than 54 mg/dL (3 mmol/L).  You get confused.  You have trouble: ? Thinking clearly. ? Breathing.  You have moderate or large ketone levels in your pee (urine). Summary  Type 1 diabetes (type 1 diabetes mellitus) is a long-term disease. It happens when your pancreas does not make enough of a hormone called insulin.  Your doctor will set treatment goals for you.  Take over-the-counter and prescription medicines only as told by your doctor.  Keep all follow-up visits as told by your doctor. This is important. This information is not intended to replace advice given to you by your health care provider. Make sure you discuss any questions you have with your health care provider. Document Released: 08/09/2015 Document Revised: 11/15/2016 Document Reviewed: 05/21/2015 Elsevier Interactive Patient Education  2019 [employer].

## 2018-12-06 ENCOUNTER — Emergency Department (HOSPITAL_COMMUNITY)
Admission: EM | Admit: 2018-12-06 | Discharge: 2018-12-06 | Disposition: A | Discharge status: Home / Self Care | Payer: Medicaid Other | Source: Home / Self Care | Attending: Emergency Medicine | Admitting: Emergency Medicine

## 2018-12-06 ENCOUNTER — Encounter (HOSPITAL_BASED_OUTPATIENT_CLINIC_OR_DEPARTMENT_OTHER): Payer: Self-pay | Admitting: Emergency Medicine

## 2018-12-06 ENCOUNTER — Other Ambulatory Visit: Payer: Self-pay

## 2018-12-06 ENCOUNTER — Emergency Department (HOSPITAL_BASED_OUTPATIENT_CLINIC_OR_DEPARTMENT_OTHER)
Admission: EM | Admit: 2018-12-06 | Discharge: 2018-12-06 | Disposition: A | Discharge status: Home / Self Care | Payer: Medicaid Other | Attending: Emergency Medicine | Admitting: Emergency Medicine

## 2018-12-06 ENCOUNTER — Encounter (HOSPITAL_COMMUNITY): Payer: Self-pay

## 2018-12-06 DIAGNOSIS — R112 Nausea with vomiting, unspecified: Secondary | ICD-10-CM

## 2018-12-06 DIAGNOSIS — Z79899 Other long term (current) drug therapy: Secondary | ICD-10-CM | POA: Insufficient documentation

## 2018-12-06 DIAGNOSIS — E1065 Type 1 diabetes mellitus with hyperglycemia: Secondary | ICD-10-CM | POA: Diagnosis not present

## 2018-12-06 DIAGNOSIS — F10921 Alcohol use, unspecified with intoxication delirium: Secondary | ICD-10-CM | POA: Diagnosis not present

## 2018-12-06 DIAGNOSIS — Y906 Blood alcohol level of 120-199 mg/100 ml: Secondary | ICD-10-CM | POA: Diagnosis not present

## 2018-12-06 DIAGNOSIS — Z794 Long term (current) use of insulin: Secondary | ICD-10-CM | POA: Insufficient documentation

## 2018-12-06 DIAGNOSIS — Z87891 Personal history of nicotine dependence: Secondary | ICD-10-CM | POA: Insufficient documentation

## 2018-12-06 DIAGNOSIS — R1084 Generalized abdominal pain: Secondary | ICD-10-CM | POA: Insufficient documentation

## 2018-12-06 DIAGNOSIS — E109 Type 1 diabetes mellitus without complications: Secondary | ICD-10-CM | POA: Insufficient documentation

## 2018-12-06 LAB — CBC
HCT: 42 % (ref 39.0–52.0)
Hemoglobin: 14 g/dL (ref 13.0–17.0)
MCH: 29.8 pg (ref 26.0–34.0)
MCHC: 33.3 g/dL (ref 30.0–36.0)
MCV: 89.4 fL (ref 80.0–100.0)
Platelets: 253 10*3/uL (ref 150–400)
RBC: 4.7 MIL/uL (ref 4.22–5.81)
RDW: 11.9 % (ref 11.5–15.5)
WBC: 18.2 10*3/uL — ABNORMAL HIGH (ref 4.0–10.5)
nRBC: 0 % (ref 0.0–0.2)

## 2018-12-06 LAB — CBC WITH DIFFERENTIAL/PLATELET
Abs Immature Granulocytes: 0.06 10*3/uL (ref 0.00–0.07)
Basophils Absolute: 0.1 10*3/uL (ref 0.0–0.1)
Basophils Relative: 0 %
Eosinophils Absolute: 0 10*3/uL (ref 0.0–0.5)
Eosinophils Relative: 0 %
HCT: 44 % (ref 39.0–52.0)
Hemoglobin: 14.3 g/dL (ref 13.0–17.0)
Immature Granulocytes: 0 %
Lymphocytes Relative: 10 %
Lymphs Abs: 1.6 10*3/uL (ref 0.7–4.0)
MCH: 29.7 pg (ref 26.0–34.0)
MCHC: 32.5 g/dL (ref 30.0–36.0)
MCV: 91.5 fL (ref 80.0–100.0)
Monocytes Absolute: 0.6 10*3/uL (ref 0.1–1.0)
Monocytes Relative: 4 %
Neutro Abs: 13.3 10*3/uL — ABNORMAL HIGH (ref 1.7–7.7)
Neutrophils Relative %: 86 %
Platelets: 198 10*3/uL (ref 150–400)
RBC: 4.81 MIL/uL (ref 4.22–5.81)
RDW: 11.9 % (ref 11.5–15.5)
WBC: 15.6 10*3/uL — ABNORMAL HIGH (ref 4.0–10.5)
nRBC: 0 % (ref 0.0–0.2)

## 2018-12-06 LAB — COMPREHENSIVE METABOLIC PANEL
ALT: 17 U/L (ref 0–44)
ALT: 20 U/L (ref 0–44)
AST: 20 U/L (ref 15–41)
AST: 24 U/L (ref 15–41)
Albumin: 4.2 g/dL (ref 3.5–5.0)
Albumin: 4.7 g/dL (ref 3.5–5.0)
Alkaline Phosphatase: 54 U/L (ref 38–126)
Alkaline Phosphatase: 59 U/L (ref 38–126)
Anion gap: 9 (ref 5–15)
Anion gap: 13 (ref 5–15)
BUN: 10 mg/dL (ref 6–20)
BUN: 10 mg/dL (ref 6–20)
CO2: 23 mmol/L (ref 22–32)
CO2: 20 mmol/L — ABNORMAL LOW (ref 22–32)
Calcium: 8.5 mg/dL — ABNORMAL LOW (ref 8.9–10.3)
Calcium: 9.4 mg/dL (ref 8.9–10.3)
Chloride: 105 mmol/L (ref 98–111)
Chloride: 108 mmol/L (ref 98–111)
Creatinine, Ser: 0.84 mg/dL (ref 0.61–1.24)
Creatinine, Ser: 0.6 mg/dL — ABNORMAL LOW (ref 0.61–1.24)
GFR calc Af Amer: >60 mL/min (ref >60)
GFR calc Af Amer: >60 mL/min (ref >60)
GFR calc non Af Amer: >60 mL/min (ref >60)
GFR calc non Af Amer: >60 mL/min (ref >60)
Glucose, Bld: 236 mg/dL — ABNORMAL HIGH (ref 70–99)
Glucose, Bld: 146 mg/dL — ABNORMAL HIGH (ref 70–99)
Potassium: 4 mmol/L (ref 3.5–5.1)
Potassium: 3.8 mmol/L (ref 3.5–5.1)
Sodium: 137 mmol/L (ref 135–145)
Sodium: 141 mmol/L (ref 135–145)
Total Bilirubin: 0.9 mg/dL (ref 0.3–1.2)
Total Bilirubin: 1.5 mg/dL — ABNORMAL HIGH (ref 0.3–1.2)
Total Protein: 7 g/dL (ref 6.5–8.1)
Total Protein: 7.8 g/dL (ref 6.5–8.1)

## 2018-12-06 LAB — POCT I-STAT EG7
Acid-base deficit: 3 mmol/L — ABNORMAL HIGH (ref 0.0–2.0)
Bicarbonate: 22.1 mmol/L (ref 20.0–28.0)
Calcium, Ion: 1.03 mmol/L — ABNORMAL LOW (ref 1.15–1.40)
HCT: 41 % (ref 39.0–52.0)
Hemoglobin: 13.9 g/dL (ref 13.0–17.0)
O2 Saturation: 75 %
Patient temperature: 98.2
Potassium: 4.3 mmol/L (ref 3.5–5.1)
Sodium: 141 mmol/L (ref 135–145)
TCO2: 23 mmol/L (ref 22–32)
pCO2, Ven: 36.4 mmHg — ABNORMAL LOW (ref 44.0–60.0)
pH, Ven: 7.389 (ref 7.250–7.430)
pO2, Ven: 40 mmHg (ref 32.0–45.0)

## 2018-12-06 LAB — CBG MONITORING, ED
Glucose-Capillary: 219 mg/dL — ABNORMAL HIGH (ref 70–99)
Glucose-Capillary: 218 mg/dL — ABNORMAL HIGH (ref 70–99)
Glucose-Capillary: 139 mg/dL — ABNORMAL HIGH (ref 70–99)
Glucose-Capillary: 132 mg/dL — ABNORMAL HIGH (ref 70–99)

## 2018-12-06 LAB — LIPASE, BLOOD: Lipase: 19 U/L (ref 11–51)

## 2018-12-06 LAB — ETHANOL: Alcohol, Ethyl (B): 197 mg/dL — ABNORMAL HIGH (ref <10)

## 2018-12-06 MED ORDER — PANTOPRAZOLE SODIUM 40 MG IV SOLR
40.0000 mg | Freq: Once | INTRAVENOUS | Status: AC
  Administered 2018-12-06: 05:00:00 40 mg via INTRAVENOUS
  Filled 2018-12-06: qty 40

## 2018-12-06 MED ORDER — SODIUM CHLORIDE 0.9 % IV BOLUS
1000.0000 mL | Freq: Once | INTRAVENOUS | Status: AC
  Administered 2018-12-06: 1000 mL via INTRAVENOUS

## 2018-12-06 MED ORDER — INSULIN ASPART 100 UNIT/ML ~~LOC~~ SOLN
5.0000 [IU] | Freq: Once | SUBCUTANEOUS | Status: AC
  Administered 2018-12-06: 07:00:00 5 [IU] via SUBCUTANEOUS
  Filled 2018-12-06: qty 1

## 2018-12-06 MED ORDER — ONDANSETRON HCL 4 MG/2ML IJ SOLN
4.0000 mg | Freq: Once | INTRAMUSCULAR | Status: AC
  Administered 2018-12-06: 4 mg via INTRAVENOUS
  Filled 2018-12-06: qty 2

## 2018-12-06 MED ORDER — SODIUM CHLORIDE 0.9% FLUSH: 3.0000 mL | Freq: Once | INTRAVENOUS | Status: DC

## 2018-12-06 MED ORDER — DICYCLOMINE HCL 20 MG PO TABS: 20.0000 mg | ORAL_TABLET | Freq: Two times a day (BID) | ORAL | 0 refills | Status: DC | PRN

## 2018-12-06 MED ORDER — DICYCLOMINE HCL 10 MG/ML IM SOLN
20.0000 mg | Freq: Once | INTRAMUSCULAR | Status: AC
  Administered 2018-12-06: 20 mg via INTRAMUSCULAR
  Filled 2018-12-06: qty 2

## 2018-12-06 MED ORDER — SODIUM CHLORIDE 0.9 % IV BOLUS
1000.0000 mL | Freq: Once | INTRAVENOUS | Status: AC
  Administered 2018-12-06: 07:00:00 1000 mL via INTRAVENOUS

## 2018-12-06 MED ORDER — PROMETHAZINE HCL 25 MG PO TABS: 25.0000 mg | ORAL_TABLET | Freq: Four times a day (QID) | ORAL | 0 refills | Status: DC | PRN

## 2018-12-06 NOTE — ED Triage Notes (Signed)
Patient arrived via [facility] from.   Patient c/o N/V and not able to keep anything down.   Patient took his insulin this morning.    CBG-157   A/Ox4 Ambulatory   Denies alcohol today.  Drank 1/2 bottle of tequila yesterday.    Mother states patient usually does not manage his DM and drinking causes DKA.

## 2018-12-06 NOTE — ED Provider Notes (Signed)
[facility]   CSN: [id number] Arrival date & time: 12/06/18  1504    History   Chief Complaint Chief Complaint  Patient presents with  . Emesis    HPI Shawn Maldonado is a 33 y.o. male w PMHx T1DM, gastroparesis, alcohol abuse, presenting with N/V and epigastric abd pain since early this morning. He was [facility] early this [facility] for the same, however had no episodes of emesis and labs showed no evidence of DKA. He  was discharged to home. He states he felt better initially, however once he went home and laid down then began having recurrence of symptoms. He did not treat his sx at home prior to returning to the ED. He reportedly drank 1/2 bottle of tequila last night. He took his usual dose of insulin this morning, 20u.     The history is provided by the patient and medical records.    Past Medical History:  Diagnosis Date  . Diabetes mellitus without complication [facility](HCC)     Patient Active Problem List   Diagnosis Date Noted  . DKA (diabetic ketoacidosis) (HCC) 07/27/2018  . Diabetic gastroparesis (HCC) 05/21/2017  . Leukocytosis 05/21/2017  . Transaminitis 05/21/2017  . Nausea & vomiting 05/17/2017  . AKI (acute kidney injury) (HCC) 05/17/2017  . Elevated LFTs 05/17/2017  . DKA, type 1 (HCC) 05/16/2017  . Hyperbilirubinemia 08/17/2015  . Intractable vomiting   . Type 1 diabetes mellitus with ketoacidosis without coma (HCC)   . Type I diabetes mellitus (HCC) 08/14/2015  . Peripheral neuropathy 08/14/2015  . Diabetic ketoacidosis without coma associated with type 1 diabetes mellitus (HCC)   . Opiate withdrawal (HCC)   . Acute upper respiratory infection 04/30/2015  . Hypokalemia 09/25/2012  . Diabetes mellitus (HCC) 09/24/2012  . DKA (diabetic ketoacidoses) (HCC) 09/24/2012  . Oral thrush 09/24/2012    Past Surgical History:  Procedure Laterality Date  . MOUTH SURGERY          Home Medications     Prior to Admission medications   Medication Sig Start Date End Date Taking? Authorizing Provider  insulin aspart (NOVOLOG) 100 UNIT/ML injection Inject 4 Units into the skin 3 (three) times daily with meals for 30 days. Plus sliding scale as directed. 07/30/18 12/06/18 Yes Almon HerculesGonfa, Taye T, MD  insulin glargine (LANTUS) 100 UNIT/ML injection Inject 0.2 mLs (20 Units total) into the skin daily. 07/30/18  Yes Almon HerculesGonfa, Taye T, MD  atorvastatin (LIPITOR) 20 MG tablet Take 1 tablet (20 mg total) by mouth daily at 6 PM. Patient not taking: Reported on 12/06/2018 07/30/18   Almon HerculesGonfa, Taye T, MD  dicyclomine (BENTYL) 20 MG tablet Take 1 tablet (20 mg total) by mouth 2 (two) times daily as needed for spasms. 12/06/18   Robinson, [place] N, PA-C  lisinopril (PRINIVIL,ZESTRIL) 5 MG tablet Take 1 tablet (5 mg total) by mouth daily. Patient not taking: Reported on 12/06/2018 07/31/18   Almon HerculesGonfa, Taye T, MD  metoCLOPramide (REGLAN) 10 MG tablet Take 1 tablet (10 mg total) by mouth every 8 (eight) hours as needed for nausea. Patient not taking: Reported on 12/06/2018 07/30/18 07/30/19  Almon HerculesGonfa, Taye T, MD  ondansetron (ZOFRAN) 4 MG tablet Take 1 tablet (4 mg total) by mouth daily as needed for nausea or vomiting. Patient not taking: Reported on 12/06/2018 07/30/18 07/30/19  Almon HerculesGonfa, Taye T, MD  pantoprazole (PROTONIX) 40 MG tablet Take 1 tablet (40 mg total) by mouth daily. Patient not taking: Reported on  12/06/2018 07/31/18   Almon HerculesGonfa, Taye T, MD  promethazine (PHENERGAN) 25 MG tablet Take 1 tablet (25 mg total) by mouth every 6 (six) hours as needed for nausea or vomiting. 12/06/18   Robinson, [place] N, PA-C    Family History Family History  Problem Relation Age of Onset  . Stroke Father   . CAD Other     Social History Social History   Tobacco Use  . Smoking status: Former Smoker    Types: Cigarettes  . Smokeless tobacco: Never Used  Substance Use Topics  . Alcohol use: Yes    Comment: social  . Drug use: Yes    Types: Marijuana      Allergies   Patient has no known allergies.   Review of Systems Review of Systems  Constitutional: Negative for fever.  Gastrointestinal: Positive for abdominal pain, nausea and vomiting.  All other systems reviewed and are negative.    Physical Exam Updated Vital Signs BP 131/76   Pulse 79   Temp 98.9 F (37.2 C) (Oral)   Resp 16   SpO2 100%   Physical Exam Vitals signs and nursing note reviewed.  Constitutional:      Appearance: He is well-developed.  HENT:     Head: Normocephalic and atraumatic.  Eyes:     Conjunctiva/sclera: Conjunctivae normal.  Cardiovascular:     Rate and Rhythm: Normal rate and regular rhythm.  Pulmonary:     Effort: Pulmonary effort is normal. No respiratory distress.     Breath sounds: Normal breath sounds.  Abdominal:     General: Bowel sounds are normal.     Palpations: Abdomen is soft.     Tenderness: Tenderness: mild, generalized. There is no guarding or rebound.  Skin:    General: Skin is warm.  Neurological:     Mental Status: He is alert.  Psychiatric:        Behavior: Behavior normal.     ED Treatments / Results  Labs (all labs ordered are listed, but only abnormal results are displayed) Labs Reviewed  COMPREHENSIVE METABOLIC PANEL - Abnormal; Notable for the following components:      Result Value   CO2 20 (*)    Glucose, Bld 146 (*)    Creatinine, Ser 0.60 (*)    Total Bilirubin 1.5 (*)    All other components within normal limits  CBC - Abnormal; Notable for the following components:   WBC 18.2 (*)    All other components within normal limits  CBG MONITORING, ED - Abnormal; Notable for the following components:   Glucose-Capillary 139 (*)    All other components within normal limits  CBG MONITORING, ED - Abnormal; Notable for the following components:   Glucose-Capillary 132 (*)    All other components within normal limits  LIPASE, BLOOD  URINALYSIS, ROUTINE W REFLEX MICROSCOPIC    EKG None  Radiology No  results found.  Procedures Procedures (including critical care time)  Medications Ordered in ED Medications  sodium chloride flush (NS) 0.9 % injection 3 mL (has no administration in time range)  sodium chloride 0.9 % bolus 1,000 mL (1,000 mLs Intravenous New Bag/Given 12/06/18 1826)  ondansetron (ZOFRAN) injection 4 mg (4 mg Intravenous Given 12/06/18 1822)  dicyclomine (BENTYL) injection 20 mg (20 mg Intramuscular Given 12/06/18 1823)     Initial Impression / Assessment and Plan / ED Course  I have reviewed the triage vital signs and the nursing notes.  Pertinent labs & imaging results that were available  during my care of the patient were reviewed by me and considered in my medical decision making (see chart for details).  Clinical Course as of Dec 06 2055  Fri Dec 06, 2018  2001 Pt re-evaluated and reports improvement in sx. Will discharge with symptomatic management and instruction to f/u with pcp.   [JR]    Clinical Course User Index [JR] Robinson, [place] N, PA-C      Patient with history of type 1 diabetes, gastroparesis, alcohol use, presenting to the emergency department for second visit today with nausea and vomiting after alcohol use last night.  He has some crampy generalized abdominal pain, however is nonfocal on exam and is in no distress.  Repeat labs done today are reassuring.  He does have a leukocytosis, likely stress response from the persistent vomiting.  His blood sugar is 146, normal gap.  He is not in DKA.  His vital signs are stable.  He is treated in the ED with fluids, antiemetics and Bentyl with improvement in symptoms.  He is tolerating p.o. and does not have intractable vomiting.  Patient feels safe for discharge.  Will discharge with symptomatic management, instructed to follow-up with PCP.  Discussed results, findings, treatment and follow up. Patient advised of return precautions. Patient verbalized understanding and agreed with plan.  Final Clinical  Impressions(s) / ED Diagnoses   Final diagnoses:  Non-intractable vomiting with nausea, unspecified vomiting type    ED Discharge Orders         Ordered    promethazine (PHENERGAN) 25 MG tablet  Every 6 hours PRN     12/06/18 2049    dicyclomine (BENTYL) 20 MG tablet  2 times daily PRN     12/06/18 2049           Robinson, [place] N, PA-C 12/06/18 2057    Milton Ferguson, MD 12/06/18 2222

## 2018-12-06 NOTE — Discharge Instructions (Signed)
Please read instructions below. Drink clear liquids until your stomach feels better. Then, slowly introduce bland foods into your diet as tolerated, such as bread, rice, apples, bananas. You can take phenergan every 6 hours as needed for nausea. Follow up with your primary care if symptoms persist. Return to the ER for severe abdominal pain, fever, uncontrollable vomiting, or new or concerning symptoms.  

## 2018-12-06 NOTE — ED Provider Notes (Signed)
[facility] Provider Note: Lowella DellJ. Lane Donnae Michels, MD, FACEP  CSN: [id number] MRN: [id number] ARRIVAL: 12/06/18 at 0406 ROOM: MH01/MH01   CHIEF COMPLAINT  Vomiting  Level 5 caveat: Intoxicated HISTORY OF PRESENT ILLNESS  12/06/18 4:14 AM Shawn Maldonado is a 33 y.o. male with a history of type 1 diabetes.  He is here with nausea and vomiting that began about 3 AM.  He admits to drinking a bottle of tequila and several beers earlier and has been smoking marijuana.  He denies any associated pain.  Some of his emesis with blood-streaked.  EMS gave him 500 mL of normal saline IV as well as 4 mg of Zofran.  He has not vomited in their presence.  He was noted to be intoxicated but able to ambulate prior to arrival.    Past Medical History:  Diagnosis Date  . Diabetes mellitus without complication [facility](HCC)     Past Surgical History:  Procedure Laterality Date  . MOUTH SURGERY      Family History  Problem Relation Age of Onset  . Stroke Father   . CAD Other     Social History   Tobacco Use  . Smoking status: Former Smoker    Types: Cigarettes  . Smokeless tobacco: Never Used  Substance Use Topics  . Alcohol use: Yes    Comment: social  . Drug use: Yes    Types: Marijuana    Prior to Admission medications   Medication Sig Start Date End Date Taking? Authorizing Provider  atorvastatin (LIPITOR) 20 MG tablet Take 1 tablet (20 mg total) by mouth daily at 6 PM. 07/30/18   Gonfa, Boyce Mediciaye T, MD  insulin aspart (NOVOLOG) 100 UNIT/ML injection Inject 4 Units into the skin 3 (three) times daily with meals for 30 days. Plus sliding scale as directed. 07/30/18 08/29/18  Almon HerculesGonfa, Taye T, MD  insulin glargine (LANTUS) 100 UNIT/ML injection Inject 0.2 mLs (20 Units total) into the skin daily. 07/30/18   Almon HerculesGonfa, Taye T, MD  lisinopril (PRINIVIL,ZESTRIL) 5 MG tablet Take 1 tablet (5 mg total) by mouth daily. 07/31/18   Almon HerculesGonfa, Taye T, MD  metoCLOPramide (REGLAN) 10 MG tablet Take 1 tablet (10 mg total) by  mouth every 8 (eight) hours as needed for nausea. 07/30/18 07/30/19  Almon HerculesGonfa, Taye T, MD  ondansetron (ZOFRAN) 4 MG tablet Take 1 tablet (4 mg total) by mouth daily as needed for nausea or vomiting. 07/30/18 07/30/19  Almon HerculesGonfa, Taye T, MD  pantoprazole (PROTONIX) 40 MG tablet Take 1 tablet (40 mg total) by mouth daily. 07/31/18   Almon HerculesGonfa, Taye T, MD    Allergies Patient has no known allergies.   REVIEW OF SYSTEMS     PHYSICAL EXAMINATION  Initial Vital Signs Blood pressure 116/78, pulse 75, temperature 98.2 F (36.8 C), temperature source Oral, resp. rate 16, height 5\' 11"  (1.803 m), weight 55.5 kg, SpO2 99 %.  Examination General: Well-developed, well-nourished male in no acute distress; appearance consistent with age of record HENT: normocephalic; atraumatic; breath nonketotic Eyes: pupils equal, round and reactive to light; disconjugate gaze when asleep Neck: supple Heart: regular rate and rhythm Lungs: clear to auscultation bilaterally Abdomen: soft; nondistended; nontender; no masses or hepatosplenomegaly; bowel sounds present Extremities: No deformity; full range of motion; pulses normal Neurologic: Somnolent but arousable to voice; dysarthria; motor function intact in all extremities and symmetric; no facial droop Skin: Warm and dry Psychiatric: Flat affect   RESULTS  Summary of this visit's results, reviewed by myself:  EKG Interpretation  Date/Time:    Ventricular Rate:    PR Interval:    QRS Duration:   QT Interval:    QTC Calculation:   R Axis:     Text Interpretation:        Laboratory Studies: Results for orders placed or performed during the hospital encounter of 12/06/18 (from the past 24 hour(s))  CBG monitoring, ED     Status: Abnormal   Collection Time: 12/06/18  4:21 AM  Result Value Ref Range   Glucose-Capillary 219 (H) 70 - 99 mg/dL  CBC with Differential/Platelet     Status: Abnormal   Collection Time: 12/06/18  4:30 AM  Result Value Ref Range   WBC  15.6 (H) 4.0 - 10.5 K/uL   RBC 4.81 4.22 - 5.81 MIL/uL   Hemoglobin 14.3 13.0 - 17.0 g/dL   HCT 44.0 39.0 - 52.0 %   MCV 91.5 80.0 - 100.0 fL   MCH 29.7 26.0 - 34.0 pg   MCHC 32.5 30.0 - 36.0 g/dL   RDW 11.9 11.5 - 15.5 %   Platelets 198 150 - 400 K/uL   nRBC 0.0 0.0 - 0.2 %   Neutrophils Relative % 86 %   Neutro Abs 13.3 (H) 1.7 - 7.7 K/uL   Lymphocytes Relative 10 %   Lymphs Abs 1.6 0.7 - 4.0 K/uL   Monocytes Relative 4 %   Monocytes Absolute 0.6 0.1 - 1.0 K/uL   Eosinophils Relative 0 %   Eosinophils Absolute 0.0 0.0 - 0.5 K/uL   Basophils Relative 0 %   Basophils Absolute 0.1 0.0 - 0.1 K/uL   Immature Granulocytes 0 %   Abs Immature Granulocytes 0.06 0.00 - 0.07 K/uL  Comprehensive metabolic panel     Status: Abnormal   Collection Time: 12/06/18  4:30 AM  Result Value Ref Range   Sodium 137 135 - 145 mmol/L   Potassium 4.0 3.5 - 5.1 mmol/L   Chloride 105 98 - 111 mmol/L   CO2 23 22 - 32 mmol/L   Glucose, Bld 236 (H) 70 - 99 mg/dL   BUN 10 6 - 20 mg/dL   Creatinine, Ser 0.84 0.61 - 1.24 mg/dL   Calcium 8.5 (L) 8.9 - 10.3 mg/dL   Total Protein 7.0 6.5 - 8.1 g/dL   Albumin 4.2 3.5 - 5.0 g/dL   AST 20 15 - 41 U/L   ALT 17 0 - 44 U/L   Alkaline Phosphatase 54 38 - 126 U/L   Total Bilirubin 0.9 0.3 - 1.2 mg/dL   GFR calc non Af Amer >60 >60 mL/min   GFR calc Af Amer >60 >60 mL/min   Anion gap 9 5 - 15  Ethanol     Status: Abnormal   Collection Time: 12/06/18  4:30 AM  Result Value Ref Range   Alcohol, Ethyl (B) 197 (H) <10 mg/dL  POCT I-Stat EG7     Status: Abnormal   Collection Time: 12/06/18  4:32 AM  Result Value Ref Range   pH, Ven 7.389 7.250 - 7.430   pCO2, Ven 36.4 (L) 44.0 - 60.0 mmHg   pO2, Ven 40.0 32.0 - 45.0 mmHg   Bicarbonate 22.1 20.0 - 28.0 mmol/L   TCO2 23 22 - 32 mmol/L   O2 Saturation 75.0 %   Acid-base deficit 3.0 (H) 0.0 - 2.0 mmol/L   Sodium 141 135 - 145 mmol/L   Potassium 4.3 3.5 - 5.1 mmol/L   Calcium, Ion 1.03 (L) 1.15 - 1.40  mmol/L    HCT 41.0 39.0 - 52.0 %   Hemoglobin 13.9 13.0 - 17.0 g/dL   Patient temperature 78.298.2 F    Collection site IV START    Drawn by Nurse    Sample type VENOUS   CBG monitoring, ED     Status: Abnormal   Collection Time: 12/06/18  6:34 AM  Result Value Ref Range   Glucose-Capillary 218 (H) 70 - 99 mg/dL   Imaging Studies: No results found.  ED COURSE and MDM  Nursing notes and initial vitals signs, including pulse oximetry, reviewed.  Vitals:   12/06/18 0410 12/06/18 0411 12/06/18 0635 12/06/18 0704  BP: 116/78  100/64 109/73  Pulse: 75  81 92  Resp: 16  16   Temp: 98.2 F (36.8 C)     TempSrc: Oral     SpO2: 99%  100% 100%  Weight:  55.5 kg    Height:  5\' 11"  (1.803 m)     IV fluid bolus, IV Protonix given.  Patient's blood gases not consistent with diabetic ketoacidosis.  The patient sister states he began drinking at a family party yesterday evening and apparently continued to drink in private to the point of significant intoxication.  7:09 AM Patient able to ambulate, has not vomited since arrival.  PROCEDURES    ED DIAGNOSES     ICD-10-CM   1. Alcohol intoxication with delirium (HCC)  F10.921   2. Nausea and vomiting in adult  R11.2   3. Hyperglycemia due to type 1 diabetes mellitus (HCC)  E10.65        Tyler Robidoux, Jonny RuizJohn, MD 12/06/18 0710

## 2018-12-06 NOTE — ED Notes (Signed)
ED Provider at bedside. 

## 2018-12-06 NOTE — ED Triage Notes (Signed)
Patient arrived via EMS from home with complaints of NV this am; states patient had etoh use this evening prior to NV; patient resting and no active vomiting noted at this time.

## 2018-12-06 NOTE — ED Notes (Signed)
Below order not completed by EW. 

## 2019-02-10 ENCOUNTER — Encounter (HOSPITAL_COMMUNITY): Payer: Self-pay | Admitting: *Deleted

## 2019-02-10 ENCOUNTER — Emergency Department (HOSPITAL_COMMUNITY): Payer: Medicaid Other

## 2019-02-10 ENCOUNTER — Other Ambulatory Visit: Payer: Self-pay

## 2019-02-10 ENCOUNTER — Inpatient Hospital Stay (HOSPITAL_COMMUNITY)
Admission: EM | Admit: 2019-02-10 | Discharge: 2019-02-14 | DRG: 639 | Disposition: A | Discharge status: Home / Self Care | Payer: Medicaid Other | Attending: Family Medicine | Admitting: Family Medicine

## 2019-02-10 DIAGNOSIS — K219 Gastro-esophageal reflux disease without esophagitis: Secondary | ICD-10-CM | POA: Diagnosis present

## 2019-02-10 DIAGNOSIS — Z794 Long term (current) use of insulin: Secondary | ICD-10-CM

## 2019-02-10 DIAGNOSIS — Z8249 Family history of ischemic heart disease and other diseases of the circulatory system: Secondary | ICD-10-CM

## 2019-02-10 DIAGNOSIS — Z20828 Contact with and (suspected) exposure to other viral communicable diseases: Secondary | ICD-10-CM | POA: Diagnosis present

## 2019-02-10 DIAGNOSIS — Z9114 Patient's other noncompliance with medication regimen: Secondary | ICD-10-CM

## 2019-02-10 DIAGNOSIS — E1010 Type 1 diabetes mellitus with ketoacidosis without coma: Principal | ICD-10-CM

## 2019-02-10 DIAGNOSIS — E1043 Type 1 diabetes mellitus with diabetic autonomic (poly)neuropathy: Secondary | ICD-10-CM | POA: Diagnosis present

## 2019-02-10 DIAGNOSIS — E1110 Type 2 diabetes mellitus with ketoacidosis without coma: Secondary | ICD-10-CM | POA: Diagnosis present

## 2019-02-10 DIAGNOSIS — K3184 Gastroparesis: Secondary | ICD-10-CM | POA: Diagnosis present

## 2019-02-10 DIAGNOSIS — E1042 Type 1 diabetes mellitus with diabetic polyneuropathy: Secondary | ICD-10-CM | POA: Diagnosis present

## 2019-02-10 DIAGNOSIS — Z9119 Patient's noncompliance with other medical treatment and regimen: Secondary | ICD-10-CM

## 2019-02-10 DIAGNOSIS — Z87891 Personal history of nicotine dependence: Secondary | ICD-10-CM

## 2019-02-10 DIAGNOSIS — Z823 Family history of stroke: Secondary | ICD-10-CM

## 2019-02-10 DIAGNOSIS — D72829 Elevated white blood cell count, unspecified: Secondary | ICD-10-CM | POA: Diagnosis present

## 2019-02-10 DIAGNOSIS — R17 Unspecified jaundice: Secondary | ICD-10-CM

## 2019-02-10 LAB — BASIC METABOLIC PANEL
Anion gap: 21 — ABNORMAL HIGH (ref 5–15)
Anion gap: 19 — ABNORMAL HIGH (ref 5–15)
BUN: 15 mg/dL (ref 6–20)
BUN: 13 mg/dL (ref 6–20)
CO2: 10 mmol/L — ABNORMAL LOW (ref 22–32)
CO2: 8 mmol/L — ABNORMAL LOW (ref 22–32)
Calcium: 9.1 mg/dL (ref 8.9–10.3)
Calcium: 8.2 mg/dL — ABNORMAL LOW (ref 8.9–10.3)
Chloride: 104 mmol/L (ref 98–111)
Chloride: 111 mmol/L (ref 98–111)
Creatinine, Ser: 0.95 mg/dL (ref 0.61–1.24)
Creatinine, Ser: 0.83 mg/dL (ref 0.61–1.24)
GFR calc Af Amer: >60 mL/min (ref >60)
GFR calc Af Amer: >60 mL/min (ref >60)
GFR calc non Af Amer: >60 mL/min (ref >60)
GFR calc non Af Amer: >60 mL/min (ref >60)
Glucose, Bld: 398 mg/dL — ABNORMAL HIGH (ref 70–99)
Glucose, Bld: 316 mg/dL — ABNORMAL HIGH (ref 70–99)
Potassium: 4.4 mmol/L (ref 3.5–5.1)
Potassium: 4.8 mmol/L (ref 3.5–5.1)
Sodium: 135 mmol/L (ref 135–145)
Sodium: 138 mmol/L (ref 135–145)

## 2019-02-10 LAB — URINALYSIS, ROUTINE W REFLEX MICROSCOPIC
Bacteria, UA: NONE SEEN
Bilirubin Urine: NEGATIVE
Glucose, UA: >=500 mg/dL — AB
Hgb urine dipstick: NEGATIVE
Ketones, ur: 80 mg/dL — AB
Leukocytes,Ua: NEGATIVE
Nitrite: NEGATIVE
Protein, ur: NEGATIVE mg/dL
Specific Gravity, Urine: 1.016 (ref 1.005–1.030)
pH: 5 (ref 5.0–8.0)

## 2019-02-10 LAB — CBC WITH DIFFERENTIAL/PLATELET
Abs Immature Granulocytes: 0.07 10*3/uL (ref 0.00–0.07)
Basophils Absolute: 0.1 10*3/uL (ref 0.0–0.1)
Basophils Relative: 1 %
Eosinophils Absolute: 0 10*3/uL (ref 0.0–0.5)
Eosinophils Relative: 0 %
HCT: 45.6 % (ref 39.0–52.0)
Hemoglobin: 14.8 g/dL (ref 13.0–17.0)
Immature Granulocytes: 1 %
Lymphocytes Relative: 12 %
Lymphs Abs: 1.5 10*3/uL (ref 0.7–4.0)
MCH: 30 pg (ref 26.0–34.0)
MCHC: 32.5 g/dL (ref 30.0–36.0)
MCV: 92.3 fL (ref 80.0–100.0)
Monocytes Absolute: 0.5 10*3/uL (ref 0.1–1.0)
Monocytes Relative: 4 %
Neutro Abs: 10.6 10*3/uL — ABNORMAL HIGH (ref 1.7–7.7)
Neutrophils Relative %: 82 %
Platelets: 213 10*3/uL (ref 150–400)
RBC: 4.94 MIL/uL (ref 4.22–5.81)
RDW: 11.9 % (ref 11.5–15.5)
WBC: 12.7 10*3/uL — ABNORMAL HIGH (ref 4.0–10.5)
nRBC: 0 % (ref 0.0–0.2)

## 2019-02-10 LAB — CBG MONITORING, ED
Glucose-Capillary: 164 mg/dL — ABNORMAL HIGH (ref 70–99)
Glucose-Capillary: 166 mg/dL — ABNORMAL HIGH (ref 70–99)
Glucose-Capillary: 183 mg/dL — ABNORMAL HIGH (ref 70–99)
Glucose-Capillary: 191 mg/dL — ABNORMAL HIGH (ref 70–99)
Glucose-Capillary: 198 mg/dL — ABNORMAL HIGH (ref 70–99)
Glucose-Capillary: 288 mg/dL — ABNORMAL HIGH (ref 70–99)
Glucose-Capillary: 327 mg/dL — ABNORMAL HIGH (ref 70–99)
Glucose-Capillary: 377 mg/dL — ABNORMAL HIGH (ref 70–99)

## 2019-02-10 LAB — PHOSPHORUS: Phosphorus: 4.3 mg/dL (ref 2.5–4.6)

## 2019-02-10 LAB — BETA-HYDROXYBUTYRIC ACID: Beta-Hydroxybutyric Acid: 7.58 mmol/L — ABNORMAL HIGH (ref 0.05–0.27)

## 2019-02-10 LAB — MAGNESIUM: Magnesium: 2 mg/dL (ref 1.7–2.4)

## 2019-02-10 MED ORDER — SODIUM CHLORIDE 0.9 % IV BOLUS
1000.0000 mL | Freq: Once | INTRAVENOUS | Status: AC
  Administered 2019-02-10: 15:00:00 1000 mL via INTRAVENOUS

## 2019-02-10 MED ORDER — SODIUM CHLORIDE 0.9 % IV SOLN: INTRAVENOUS | Status: DC

## 2019-02-10 MED ORDER — DEXTROSE-NACL 5-0.45 % IV SOLN
INTRAVENOUS | Status: DC
  Administered 2019-02-11 – 2019-02-12 (×3): via INTRAVENOUS

## 2019-02-10 MED ORDER — INSULIN REGULAR(HUMAN) IN NACL 100-0.9 UT/100ML-% IV SOLN
INTRAVENOUS | Status: DC
  Administered 2019-02-10: 18:00:00 2.7 [IU]/h via INTRAVENOUS
  Filled 2019-02-10: qty 100

## 2019-02-10 MED ORDER — INSULIN REGULAR(HUMAN) IN NACL 100-0.9 UT/100ML-% IV SOLN
INTRAVENOUS | Status: DC
  Administered 2019-02-12: 1.9 [IU]/h via INTRAVENOUS
  Administered 2019-02-12: 04:00:00 5.7 [IU]/h via INTRAVENOUS
  Filled 2019-02-10: qty 100

## 2019-02-10 MED ORDER — ONDANSETRON HCL 4 MG/2ML IJ SOLN
4.0000 mg | Freq: Four times a day (QID) | INTRAMUSCULAR | Status: DC | PRN
  Administered 2019-02-10 – 2019-02-14 (×8): 4 mg via INTRAVENOUS
  Filled 2019-02-10 (×8): qty 2

## 2019-02-10 MED ORDER — SODIUM CHLORIDE 0.9 % IV SOLN
INTRAVENOUS | Status: DC
  Administered 2019-02-10: 18:00:00 via INTRAVENOUS

## 2019-02-10 MED ORDER — PANTOPRAZOLE SODIUM 40 MG IV SOLR
40.0000 mg | INTRAVENOUS | Status: DC
  Administered 2019-02-10 – 2019-02-14 (×5): 40 mg via INTRAVENOUS
  Filled 2019-02-10 (×5): qty 40

## 2019-02-10 MED ORDER — DEXTROSE-NACL 5-0.45 % IV SOLN
INTRAVENOUS | Status: DC
  Administered 2019-02-10: 21:00:00 via INTRAVENOUS

## 2019-02-10 MED ORDER — PROMETHAZINE HCL 25 MG/ML IJ SOLN
12.5000 mg | Freq: Once | INTRAMUSCULAR | Status: AC
  Administered 2019-02-10: 12.5 mg via INTRAVENOUS
  Filled 2019-02-10: qty 1

## 2019-02-10 MED ORDER — POTASSIUM CHLORIDE 10 MEQ/100ML IV SOLN
10.0000 meq | INTRAVENOUS | Status: AC
  Administered 2019-02-10 (×2): 10 meq via INTRAVENOUS
  Filled 2019-02-10 (×2): qty 100

## 2019-02-10 MED ORDER — ONDANSETRON HCL 4 MG/2ML IJ SOLN
4.0000 mg | Freq: Once | INTRAMUSCULAR | Status: AC
  Administered 2019-02-10: 15:00:00 4 mg via INTRAVENOUS
  Filled 2019-02-10: qty 2

## 2019-02-10 MED ORDER — ENOXAPARIN SODIUM 40 MG/0.4ML ~~LOC~~ SOLN
40.0000 mg | SUBCUTANEOUS | Status: DC
  Administered 2019-02-12 – 2019-02-13 (×3): 40 mg via SUBCUTANEOUS
  Filled 2019-02-10 (×4): qty 0.4

## 2019-02-10 MED ORDER — DICYCLOMINE HCL 20 MG PO TABS
20.0000 mg | ORAL_TABLET | Freq: Two times a day (BID) | ORAL | Status: DC | PRN
  Filled 2019-02-10: qty 1

## 2019-02-10 MED ORDER — SODIUM CHLORIDE 0.9 % IV BOLUS
1000.0000 mL | Freq: Once | INTRAVENOUS | Status: AC
  Administered 2019-02-10: 1000 mL via INTRAVENOUS

## 2019-02-10 MED ORDER — SODIUM CHLORIDE 0.9 % IV BOLUS
1000.0000 mL | Freq: Once | INTRAVENOUS | Status: AC
  Administered 2019-02-10: 19:00:00 1000 mL via INTRAVENOUS

## 2019-02-10 NOTE — ED Notes (Signed)
Pt still stating he is nauseous was able to keep ice chips down.

## 2019-02-10 NOTE — H&P (Signed)
History and Physical    Shawn Maldonado FBP:[id number] DOB: [birth year] DOA: 02/10/2019  PCP: System, Pcp Not In   Patient coming from: Home  Chief Complaint: Nausea and Vomiting   HPI: Shawn Maldonado is a 33 y.o. male with medical history significant for but not limited to diabetes mellitus type 1 with history of diabetic gastroparesis, history of transaminitis and elevated LFTs, hyperbilirubinemia, peripheral neuropathy, history of diabetic ketoacidosis and uncontrolled diabetes mellitus type 1 with last hemoglobin A1c of 12.6 scented to the ED with a chief complaint of nausea or vomiting that started after he missed his insulin doses.  He woke up this morning feeling unwell and given a dose of Lantus but unclear how much he took and started having nausea and vomiting.  They also started having some general abdominal cramping and pain around the diarrhea cough and congestion.  Not been around anybody has been sick.  Recently discharge from the hospital Sep 29, 2018 after admission for DKA and he was seen in the ED in August.  Denies any lightheadedness or dizziness or chest pain currently but continues to be severely nauseous and doubled over about to vomit.  He denies any other concerns or complaints at this time and [facility] was asked to meet this patient for diabetic ketoacidosis and type I diabetic.  ED Course: In the ED he had basic blood work done and was given 2 L of normal saline boluses and started on maintenance IV fluid hydration and started on insulin drip.  I asked the ED provider to add on a UDS and his SARS-CoV-2 testing was pending  Review of Systems: As per HPI otherwise all other systems reviewed and negative.   Past Medical History:  Diagnosis Date  . Diabetes mellitus without complication [facility])    Past Surgical History:  Procedure Laterality Date  . MOUTH SURGERY     SOCIAL HISTORY   reports that he has quit smoking. His smoking use included cigarettes. He has never used smokeless  tobacco. He reports current alcohol use. He reports current drug use. Drug: Marijuana.  ALLERGIES No Known Allergies  Family History  Problem Relation Age of Onset  . Stroke Father   . CAD Other    Prior to Admission medications   Medication Sig Start Date End Date Taking? Authorizing Provider  insulin aspart (NOVOLOG) 100 UNIT/ML injection Inject 4 Units into the skin 3 (three) times daily with meals for 30 days. Plus sliding scale as directed. 07/30/18 02/10/19 Yes Mercy Riding, MD  insulin glargine (LANTUS) 100 UNIT/ML injection Inject 0.2 mLs (20 Units total) into the skin daily. 07/30/18  Yes Mercy Riding, MD  atorvastatin (LIPITOR) 20 MG tablet Take 1 tablet (20 mg total) by mouth daily at 6 PM. Patient not taking: Reported on 12/06/2018 07/30/18   Mercy Riding, MD  dicyclomine (BENTYL) 20 MG tablet Take 1 tablet (20 mg total) by mouth 2 (two) times daily as needed for spasms. Patient not taking: Reported on 02/10/2019 12/06/18   Robinson, [place] N, PA-C  lisinopril (PRINIVIL,ZESTRIL) 5 MG tablet Take 1 tablet (5 mg total) by mouth daily. Patient not taking: Reported on 12/06/2018 07/31/18   Mercy Riding, MD  metoCLOPramide (REGLAN) 10 MG tablet Take 1 tablet (10 mg total) by mouth every 8 (eight) hours as needed for nausea. Patient not taking: Reported on 12/06/2018 07/30/18 07/30/19  Mercy Riding, MD  ondansetron (ZOFRAN) 4 MG tablet Take 1 tablet (4 mg total) by mouth daily as  needed for nausea or vomiting. Patient not taking: Reported on 12/06/2018 07/30/18 07/30/19  Almon Hercules, MD  pantoprazole (PROTONIX) 40 MG tablet Take 1 tablet (40 mg total) by mouth daily. Patient not taking: Reported on 12/06/2018 07/31/18   Almon Hercules, MD  promethazine (PHENERGAN) 25 MG tablet Take 1 tablet (25 mg total) by mouth every 6 (six) hours as needed for nausea or vomiting. Patient not taking: Reported on 02/10/2019 12/06/18   Robinson, [place] N, PA-C   Physical Exam: Vitals:   02/10/19 1314 02/10/19 1323  02/10/19 1532 02/10/19 1659  BP:  129/73 119/70 131/69  Pulse:  88 93 99  Resp:  19 19 16   Temp:  (!) 97.3 F (36.3 C)    TempSrc:  Oral    SpO2:  100% 100% 100%  Weight: 61.2 kg     Height: 5\' 9"  (1.753 m)      Constitutional: Thin Male in moderate distress appears ill and uncomfortable Eyes: Lids and conjunctivae normal, sclerae anicteric  ENMT: External Ears, Nose appear normal. Grossly normal hearing. Mucous membranes are Dry Neck: Appears normal, supple, no cervical masses, normal ROM, no appreciable thyromegaly Respiratory: Diminished to auscultation bilaterally, no wheezing, rales, rhonchi or crackles. Normal respiratory effort and patient is not tachypenic. No accessory muscle use. Unlabored breathing  Cardiovascular: Mildly tachycardic, no murmurs / rubs / gallops. S1 and S2 auscultated.  Abdomen: Soft, Tender, non-distended. Bowel sounds positive x4.  GU: Deferred. Musculoskeletal: No clubbing / cyanosis of digits/nails. No joint deformity upper and lower extremities.  Skin: No rashes, lesions, ulcers on a limited skin evaluation but has a Right Arm Sleeve tattoo. No induration; Warm and dry.  Neurologic: CN 2-12 grossly intact with no focal deficits. Romberg sign cerebellar reflexes not assessed.  Psychiatric: Normal judgment and insight. Alert and oriented x 3. Anxious mood and appropriate affect.   Labs on Admission: I have personally reviewed following labs and imaging studies  CBC: Recent Labs  Lab 02/10/19 1331  WBC 12.7*  NEUTROABS 10.6*  HGB 14.8  HCT 45.6  MCV 92.3  PLT 213   Basic Metabolic Panel: Recent Labs  Lab 02/10/19 1327  NA 135  K 4.4  CL 104  CO2 10*  GLUCOSE 398*  BUN 15  CREATININE 0.95  CALCIUM 9.1   GFR: Estimated Creatinine Clearance: 94.8 mL/min (by C-G formula based on SCr of 0.95 mg/dL). Liver Function Tests: No results for input(s): AST, ALT, ALKPHOS, BILITOT, PROT, ALBUMIN in the last 168 hours. No results for input(s):  LIPASE, AMYLASE in the last 168 hours. No results for input(s): AMMONIA in the last 168 hours. Coagulation Profile: No results for input(s): INR, PROTIME in the last 168 hours. Cardiac Enzymes: No results for input(s): CKTOTAL, CKMB, CKMBINDEX, TROPONINI in the last 168 hours. BNP (last 3 results) No results for input(s): PROBNP in the last 8760 hours. HbA1C: No results for input(s): HGBA1C in the last 72 hours. CBG: Recent Labs  Lab 02/10/19 1346  GLUCAP 377*   Lipid Profile: No results for input(s): CHOL, HDL, LDLCALC, TRIG, CHOLHDL, LDLDIRECT in the last 72 hours. Thyroid Function Tests: No results for input(s): TSH, T4TOTAL, FREET4, T3FREE, THYROIDAB in the last 72 hours. Anemia Panel: No results for input(s): VITAMINB12, FOLATE, FERRITIN, TIBC, IRON, RETICCTPCT in the last 72 hours. Urine analysis:    Component Value Date/Time   COLORURINE STRAW (A) 02/10/2019 1536   APPEARANCEUR CLEAR 02/10/2019 1536   LABSPEC 1.016 02/10/2019 1536   PHURINE 5.0 02/10/2019  1536   GLUCOSEU >=500 (A) 02/10/2019 1536   HGBUR NEGATIVE 02/10/2019 1536   BILIRUBINUR NEGATIVE 02/10/2019 1536   KETONESUR 80 (A) 02/10/2019 1536   PROTEINUR NEGATIVE 02/10/2019 1536   UROBILINOGEN 0.2 09/24/2012 1037   NITRITE NEGATIVE 02/10/2019 1536   LEUKOCYTESUR NEGATIVE 02/10/2019 1536   Sepsis Labs: !!!!!!!!!!!!!!!!!!!!!!!!!!!!!!!!!!!!!!!!!!!! @LABRCNTIP (procalcitonin:4,lacticidven:4) )No results found for this or any previous visit (from the past 240 hour(s)).   Radiological Exams on Admission: Dg Chest Portable 1 View  Result Date: 02/10/2019 CLINICAL DATA:  Weakness, nausea and vomiting. EXAM: PORTABLE CHEST 1 VIEW COMPARISON:  July 27, 2018 FINDINGS: Cardiomediastinal silhouette is normal. Mediastinal contours appear intact. There is no evidence of focal airspace consolidation, pleural effusion or pneumothorax. Osseous structures are without acute abnormality. Soft tissues are grossly normal.  IMPRESSION: No active disease. Electronically Signed   By: Ted Mcalpineobrinka  Dimitrova M.D.   On: 02/10/2019 15:55    EKG: No EKG done on admission so we will order one now  Assessment/Plan Active Problems:   DKA (diabetic ketoacidoses) (HCC)  DKA with hyperglycemia in a type 1 diabetic from likely noncompliance and missed insulin doses complicated by diabetic peripheral neuropathy -Place in observation stepdown unit for now -Given 2 L normal saline boluses in the ED and will start on maintenance IV fluid hydration with normal saline and changed to D5 half-normal saline once blood sugars are less than 250 -Continue with insulin drip at Endo tool is currently not yet live -We will need a diabetes education coordinator consult -BMPs every 4 hours -N.p.o. for now -Continue with antiemetics and supportive care -Beta hydroxybutyrate acid was 7.58 -UA was unremarkable except it did show some glucosuria -Chest x-ray is normal -Check UDS -SARS-CoV-2 pending -Continues to be extremely nauseous and will hold all medications by mouth for now -Transition to long-acting insulin once patient's gap is closed and his bicarbonate level is greater than 202 and is able to tolerate a diet  Leukocytosis -In the setting of DKA -Patient's WBC is 12.7 -Continue monitor and trend and repeat CBC in a.m.  High anion gap metabolic acidosis -Patient CO2 was 8 and anion gap was 19 on admission record level 111 -Treatment as above  Intractable nausea and vomiting in the setting of his DKA -Continue with supportive care and antiemetics  History of GERD and diabetic gastroparesis -Continue with IV pantoprazole 40 mg every 24 -May consider adding back his Reglan -Avoid narcotics given slowing of gut motility and history of patient having opiate withdrawal  DVT prophylaxis: SCDs and enoxaparin Code Status: Full code Family Communication: No family present at bedside) Disposition Plan: Place in observation  stepdown unit for now and if patient improves and able to tolerate diet continue home tomorrow Consults called: None Admission status: Observation stepdown  Severity of Illness: The appropriate patient status for this patient is OBSERVATION. Observation status is judged to be reasonable and necessary in order to provide the required intensity of service to ensure the patient's safety. The patient's presenting symptoms, physical exam findings, and initial radiographic and laboratory data in the context of their medical condition is felt to place them at decreased risk for further clinical deterioration. Furthermore, it is anticipated that the patient will be medically stable for discharge from the hospital within 2 midnights of admission. The following factors support the patient status of observation.   " The patient's presenting symptoms include Nausea and Vomiting . " The physical exam findings include Dry MM. " The initial radiographic and laboratory data are  Reassuring.  Merlene Laughter, D.O. [facility] PAGER is on AMION  If 7PM-7AM, please contact night-coverage [email or web address] Password [facility]  02/10/2019, 5:44 PM

## 2019-02-10 NOTE — Progress Notes (Addendum)
Patient vomiting. Too early to give Zofran. Paged K. Schorr. New order for IV phenergan x1. Will continue to monitor

## 2019-02-10 NOTE — ED Provider Notes (Signed)
[facility]-EMERGENCY DEPT Provider Note   CSN: [id number] Arrival date & time: 02/10/19  1306     History   Chief Complaint Chief Complaint  Patient presents with  . Hyperglycemia  . Emesis    HPI Jedi Catalfamo is a 33 y.o. male insulin-dependent diabetic presenting to emergency department with nausea vomiting and hyperglycemia.  Patient reports that he missed his NovoLog yesterday.  He woke up this morning feeling unwell.  He gave himself a dose of Lantus, it is unclear whether he took 12 or 20 units of this.  He began having nausea and vomiting.  He has general cramping abdominal pain.  He denies any diarrhea.  Denies any fevers or chills.  Denies any cough or congestion.  Unfortunately per medical records the patient has a recurring history of DKA.  This appears to be related to medical noncompliance.  His H1Ac was 12.6%. He was discharged from the hospital most recently on July 30, 2018 after admission for DKA.  He was subsequently seen in our ER on December 07, 2018 again for hyperemesis and similar presentation.  After hospital discharge in March, he was discharged on Lantus 20 units daily, NovoLog 4 units 3 times a day plus a sliding scale.     HPI  Past Medical History:  Diagnosis Date  . Diabetes mellitus without complication [facility])     Patient Active Problem List   Diagnosis Date Noted  . DKA (diabetic ketoacidosis) (HCC) 07/27/2018  . Diabetic gastroparesis (HCC) 05/21/2017  . Leukocytosis 05/21/2017  . Transaminitis 05/21/2017  . Nausea & vomiting 05/17/2017  . AKI (acute kidney injury) (HCC) 05/17/2017  . Elevated LFTs 05/17/2017  . DKA, type 1 (HCC) 05/16/2017  . Hyperbilirubinemia 08/17/2015  . Intractable vomiting   . Type 1 diabetes mellitus with ketoacidosis without coma (HCC)   . Type I diabetes mellitus (HCC) 08/14/2015  . Peripheral neuropathy 08/14/2015  . Diabetic ketoacidosis without coma associated with type 1 diabetes mellitus  (HCC)   . Opiate withdrawal (HCC)   . Acute upper respiratory infection 04/30/2015  . Hypokalemia 09/25/2012  . Diabetes mellitus (HCC) 09/24/2012  . DKA (diabetic ketoacidoses) (HCC) 09/24/2012  . Oral thrush 09/24/2012    Past Surgical History:  Procedure Laterality Date  . MOUTH SURGERY          Home Medications    Prior to Admission medications   Medication Sig Start Date End Date Taking? Authorizing Provider  insulin aspart (NOVOLOG) 100 UNIT/ML injection Inject 4 Units into the skin 3 (three) times daily with meals for 30 days. Plus sliding scale as directed. 07/30/18 02/10/19 Yes Almon Hercules, MD  insulin glargine (LANTUS) 100 UNIT/ML injection Inject 0.2 mLs (20 Units total) into the skin daily. 07/30/18  Yes Almon Hercules, MD  atorvastatin (LIPITOR) 20 MG tablet Take 1 tablet (20 mg total) by mouth daily at 6 PM. Patient not taking: Reported on 12/06/2018 07/30/18   Almon Hercules, MD  dicyclomine (BENTYL) 20 MG tablet Take 1 tablet (20 mg total) by mouth 2 (two) times daily as needed for spasms. Patient not taking: Reported on 02/10/2019 12/06/18   Robinson, [place] N, PA-C  lisinopril (PRINIVIL,ZESTRIL) 5 MG tablet Take 1 tablet (5 mg total) by mouth daily. Patient not taking: Reported on 12/06/2018 07/31/18   Almon Hercules, MD  metoCLOPramide (REGLAN) 10 MG tablet Take 1 tablet (10 mg total) by mouth every 8 (eight) hours as needed for nausea. Patient not taking: Reported on  12/06/2018 07/30/18 07/30/19  Mercy Riding, MD  ondansetron (ZOFRAN) 4 MG tablet Take 1 tablet (4 mg total) by mouth daily as needed for nausea or vomiting. Patient not taking: Reported on 12/06/2018 07/30/18 07/30/19  Mercy Riding, MD  pantoprazole (PROTONIX) 40 MG tablet Take 1 tablet (40 mg total) by mouth daily. Patient not taking: Reported on 12/06/2018 07/31/18   Mercy Riding, MD  promethazine (PHENERGAN) 25 MG tablet Take 1 tablet (25 mg total) by mouth every 6 (six) hours as needed for nausea or vomiting.  Patient not taking: Reported on 02/10/2019 12/06/18   Robinson, [place] N, PA-C    Family History Family History  Problem Relation Age of Onset  . Stroke Father   . CAD Other     Social History Social History   Tobacco Use  . Smoking status: Former Smoker    Types: Cigarettes  . Smokeless tobacco: Never Used  Substance Use Topics  . Alcohol use: Yes    Comment: social  . Drug use: Yes    Types: Marijuana     Allergies   Patient has no known allergies.   Review of Systems Review of Systems  Constitutional: Positive for appetite change. Negative for chills and fever.  Eyes: Negative for photophobia and visual disturbance.  Respiratory: Negative for cough and shortness of breath.   Cardiovascular: Negative for chest pain and palpitations.  Gastrointestinal: Positive for abdominal pain, nausea and vomiting. Negative for constipation and diarrhea.  Genitourinary: Positive for frequency. Negative for dysuria and hematuria.  Skin: Negative for color change and rash.  Neurological: Positive for headaches. Negative for syncope.  Psychiatric/Behavioral: Negative for agitation and confusion.  All other systems reviewed and are negative.    Physical Exam Updated Vital Signs BP 131/69 (BP Location: Right Arm)   Pulse 99   Temp (!) 97.3 F (36.3 C) (Oral)   Resp 16   Ht 5\' 9"  (1.753 m)   Wt 61.2 kg   SpO2 100%   BMI 19.94 kg/m   Physical Exam Vitals signs and nursing note reviewed.  Constitutional:      Appearance: He is well-developed. He is ill-appearing.     Comments: Dry heaving  HENT:     Head: Normocephalic and atraumatic.  Eyes:     Conjunctiva/sclera: Conjunctivae normal.  Neck:     Musculoskeletal: Neck supple.  Cardiovascular:     Rate and Rhythm: Regular rhythm. Tachycardia present.     Pulses: Normal pulses.  Pulmonary:     Effort: Pulmonary effort is normal. No respiratory distress.     Breath sounds: Normal breath sounds.  Abdominal:      General: There is no distension.     Palpations: Abdomen is soft.     Tenderness: There is no abdominal tenderness.  Skin:    General: Skin is warm and dry.  Neurological:     General: No focal deficit present.     Mental Status: He is alert and oriented to person, place, and time.  Psychiatric:        Mood and Affect: Mood normal.        Behavior: Behavior normal.      ED Treatments / Results  Labs (all labs ordered are listed, but only abnormal results are displayed) Labs Reviewed  BASIC METABOLIC PANEL - Abnormal; Notable for the following components:      Result Value   CO2 10 (*)    Glucose, Bld 398 (*)    Anion gap  21 (*)    All other components within normal limits  URINALYSIS, ROUTINE W REFLEX MICROSCOPIC - Abnormal; Notable for the following components:   Color, Urine STRAW (*)    Glucose, UA >=500 (*)    Ketones, ur 80 (*)    All other components within normal limits  CBC WITH DIFFERENTIAL/PLATELET - Abnormal; Notable for the following components:   WBC 12.7 (*)    Neutro Abs 10.6 (*)    All other components within normal limits  BETA-HYDROXYBUTYRIC ACID - Abnormal; Notable for the following components:   Beta-Hydroxybutyric Acid 7.58 (*)    All other components within normal limits  BASIC METABOLIC PANEL - Abnormal; Notable for the following components:   CO2 8 (*)    Glucose, Bld 316 (*)    Calcium 8.2 (*)    Anion gap 19 (*)    All other components within normal limits  CBG MONITORING, ED - Abnormal; Notable for the following components:   Glucose-Capillary 377 (*)    All other components within normal limits  SARS CORONAVIRUS 2 (TAT 6-24 HRS)    EKG None  Radiology Dg Chest Portable 1 View  Result Date: 02/10/2019 CLINICAL DATA:  Weakness, nausea and vomiting. EXAM: PORTABLE CHEST 1 VIEW COMPARISON:  July 27, 2018 FINDINGS: Cardiomediastinal silhouette is normal. Mediastinal contours appear intact. There is no evidence of focal airspace  consolidation, pleural effusion or pneumothorax. Osseous structures are without acute abnormality. Soft tissues are grossly normal. IMPRESSION: No active disease. Electronically Signed   By: Ted Mcalpine M.D.   On: 02/10/2019 15:55    Procedures .Critical Care Performed by: Terald Sleeper, MD Authorized by: Terald Sleeper, MD   Critical care provider statement:    Critical care time (minutes):  30   Critical care was time spent personally by me on the following activities:  Discussions with consultants, evaluation of patient's response to treatment, examination of patient, ordering and performing treatments and interventions, ordering and review of laboratory studies, ordering and review of radiographic studies, pulse oximetry, re-evaluation of patient's condition, obtaining history from patient or surrogate and review of old charts   (including critical care time)  Medications Ordered in ED Medications  insulin regular, human (MYXREDLIN) 100 units/ 100 mL infusion (has no administration in time range)  potassium chloride 10 mEq in 100 mL IVPB (has no administration in time range)  dextrose 5 %-0.45 % sodium chloride infusion (has no administration in time range)  0.9 %  sodium chloride infusion (has no administration in time range)  sodium chloride 0.9 % bolus 1,000 mL (0 mLs Intravenous Stopped 02/10/19 1524)  sodium chloride 0.9 % bolus 1,000 mL (0 mLs Intravenous Stopped 02/10/19 1709)  ondansetron (ZOFRAN) injection 4 mg (4 mg Intravenous Given 02/10/19 1523)     Initial Impression / Assessment and Plan / ED Course  I have reviewed the triage vital signs and the nursing notes.  Pertinent labs & imaging results that were available during my care of the patient were reviewed by me and considered in my medical decision making (see chart for details).  33 year old male with a history of medical noncompliance presenting to the emergency department for suspected DKA.  He  reports he is only missed 1 day of his medication.  However review of his chart reveals multiple hospital admissions for medical noncompliance.  He has no other localizing infectious symptoms.  We will check a UA and a chest x-ray as part of his work-up.  We will  otherwise check his labs and give him IV fluids for the time being.  Will give insulin after K+ results.  Patient was given IV Zofran.  He is still dry heaving.    Clinical Course as of Feb 10 1756  Mon Feb 10, 2019  1746 Patient reports he was feeling no better after IV fluids.  We will recheck his BMP, however given that he is unable to tolerate p.o, suspect will need to admit him to the hospital.  He reports on prior efforts to discharge him from the ED, he returned back to the hospital within a day.  We will order him his insulin as well as potassium repletion per protocol.  Will order him maintenance fluids.  Spoke to the hospitalist and give signout.  We will also swab the patient for COVID as a screening exam.   [MT]    Clinical Course User Index [MT] Furman Trentman, Kermit BaloMatthew J, MD     Final Clinical Impressions(s) / ED Diagnoses   Final diagnoses:  Diabetic ketoacidosis without coma associated with type 1 diabetes mellitus [facility](HCC)    ED Discharge Orders    None       Terald Sleeperrifan, Gilford Lardizabal J, MD 02/10/19 1757

## 2019-02-10 NOTE — ED Triage Notes (Signed)
BIB EMS, pt hyperglycemic, left insulin at work yesterday, started vomiting this morning, CBG 408, Pt given 8 mg Zofran, BP 132/88-94-98%

## 2019-02-11 DIAGNOSIS — E1010 Type 1 diabetes mellitus with ketoacidosis without coma: Secondary | ICD-10-CM | POA: Diagnosis present

## 2019-02-11 DIAGNOSIS — Z20828 Contact with and (suspected) exposure to other viral communicable diseases: Secondary | ICD-10-CM | POA: Diagnosis present

## 2019-02-11 DIAGNOSIS — K3184 Gastroparesis: Secondary | ICD-10-CM | POA: Diagnosis present

## 2019-02-11 DIAGNOSIS — K219 Gastro-esophageal reflux disease without esophagitis: Secondary | ICD-10-CM | POA: Diagnosis present

## 2019-02-11 DIAGNOSIS — E1043 Type 1 diabetes mellitus with diabetic autonomic (poly)neuropathy: Secondary | ICD-10-CM | POA: Diagnosis present

## 2019-02-11 DIAGNOSIS — Z794 Long term (current) use of insulin: Secondary | ICD-10-CM | POA: Diagnosis not present

## 2019-02-11 DIAGNOSIS — D72829 Elevated white blood cell count, unspecified: Secondary | ICD-10-CM

## 2019-02-11 DIAGNOSIS — E1042 Type 1 diabetes mellitus with diabetic polyneuropathy: Secondary | ICD-10-CM | POA: Diagnosis present

## 2019-02-11 DIAGNOSIS — Z8249 Family history of ischemic heart disease and other diseases of the circulatory system: Secondary | ICD-10-CM | POA: Diagnosis not present

## 2019-02-11 DIAGNOSIS — R17 Unspecified jaundice: Secondary | ICD-10-CM | POA: Diagnosis not present

## 2019-02-11 DIAGNOSIS — E1143 Type 2 diabetes mellitus with diabetic autonomic (poly)neuropathy: Secondary | ICD-10-CM | POA: Diagnosis not present

## 2019-02-11 DIAGNOSIS — Z87891 Personal history of nicotine dependence: Secondary | ICD-10-CM | POA: Diagnosis not present

## 2019-02-11 DIAGNOSIS — Z823 Family history of stroke: Secondary | ICD-10-CM | POA: Diagnosis not present

## 2019-02-11 DIAGNOSIS — Z9119 Patient's noncompliance with other medical treatment and regimen: Secondary | ICD-10-CM | POA: Diagnosis not present

## 2019-02-11 DIAGNOSIS — R112 Nausea with vomiting, unspecified: Secondary | ICD-10-CM | POA: Diagnosis not present

## 2019-02-11 DIAGNOSIS — Z9114 Patient's other noncompliance with medication regimen: Secondary | ICD-10-CM | POA: Diagnosis not present

## 2019-02-11 LAB — BASIC METABOLIC PANEL
Anion gap: 11 (ref 5–15)
Anion gap: 12 (ref 5–15)
Anion gap: 12 (ref 5–15)
Anion gap: 12 (ref 5–15)
BUN: 9 mg/dL (ref 6–20)
BUN: 9 mg/dL (ref 6–20)
BUN: 9 mg/dL (ref 6–20)
BUN: 12 mg/dL (ref 6–20)
CO2: 12 mmol/L — ABNORMAL LOW (ref 22–32)
CO2: 16 mmol/L — ABNORMAL LOW (ref 22–32)
CO2: 16 mmol/L — ABNORMAL LOW (ref 22–32)
CO2: 16 mmol/L — ABNORMAL LOW (ref 22–32)
Calcium: 8.9 mg/dL (ref 8.9–10.3)
Calcium: 8.8 mg/dL — ABNORMAL LOW (ref 8.9–10.3)
Calcium: 8.9 mg/dL (ref 8.9–10.3)
Calcium: 9 mg/dL (ref 8.9–10.3)
Chloride: 113 mmol/L — ABNORMAL HIGH (ref 98–111)
Chloride: 109 mmol/L (ref 98–111)
Chloride: 110 mmol/L (ref 98–111)
Chloride: 109 mmol/L (ref 98–111)
Creatinine, Ser: 0.86 mg/dL (ref 0.61–1.24)
Creatinine, Ser: 0.73 mg/dL (ref 0.61–1.24)
Creatinine, Ser: 0.75 mg/dL (ref 0.61–1.24)
Creatinine, Ser: 0.85 mg/dL (ref 0.61–1.24)
GFR calc Af Amer: >60 mL/min (ref >60)
GFR calc Af Amer: >60 mL/min (ref >60)
GFR calc Af Amer: >60 mL/min (ref >60)
GFR calc Af Amer: >60 mL/min (ref >60)
GFR calc non Af Amer: >60 mL/min (ref >60)
GFR calc non Af Amer: >60 mL/min (ref >60)
GFR calc non Af Amer: >60 mL/min (ref >60)
GFR calc non Af Amer: >60 mL/min (ref >60)
Glucose, Bld: 173 mg/dL — ABNORMAL HIGH (ref 70–99)
Glucose, Bld: 153 mg/dL — ABNORMAL HIGH (ref 70–99)
Glucose, Bld: 154 mg/dL — ABNORMAL HIGH (ref 70–99)
Glucose, Bld: 241 mg/dL — ABNORMAL HIGH (ref 70–99)
Potassium: 4.2 mmol/L (ref 3.5–5.1)
Potassium: 3.6 mmol/L (ref 3.5–5.1)
Potassium: 3.6 mmol/L (ref 3.5–5.1)
Potassium: 3.6 mmol/L (ref 3.5–5.1)
Sodium: 136 mmol/L (ref 135–145)
Sodium: 137 mmol/L (ref 135–145)
Sodium: 138 mmol/L (ref 135–145)
Sodium: 137 mmol/L (ref 135–145)

## 2019-02-11 LAB — RAPID URINE DRUG SCREEN, HOSP PERFORMED
Amphetamines: NOT DETECTED
Barbiturates: NOT DETECTED
Benzodiazepines: NOT DETECTED
Cocaine: POSITIVE — AB
Opiates: NOT DETECTED
Tetrahydrocannabinol: POSITIVE — AB

## 2019-02-11 LAB — CBC WITH DIFFERENTIAL/PLATELET
Abs Immature Granulocytes: 0.08 10*3/uL — ABNORMAL HIGH (ref 0.00–0.07)
Basophils Absolute: 0 10*3/uL (ref 0.0–0.1)
Basophils Relative: 0 %
Eosinophils Absolute: 0 10*3/uL (ref 0.0–0.5)
Eosinophils Relative: 0 %
HCT: 39.4 % (ref 39.0–52.0)
Hemoglobin: 13.4 g/dL (ref 13.0–17.0)
Immature Granulocytes: 1 %
Lymphocytes Relative: 8 %
Lymphs Abs: 1.4 10*3/uL (ref 0.7–4.0)
MCH: 30.5 pg (ref 26.0–34.0)
MCHC: 34 g/dL (ref 30.0–36.0)
MCV: 89.5 fL (ref 80.0–100.0)
Monocytes Absolute: 0.8 10*3/uL (ref 0.1–1.0)
Monocytes Relative: 5 %
Neutro Abs: 15 10*3/uL — ABNORMAL HIGH (ref 1.7–7.7)
Neutrophils Relative %: 86 %
Platelets: 159 10*3/uL (ref 150–400)
RBC: 4.4 MIL/uL (ref 4.22–5.81)
RDW: 11.9 % (ref 11.5–15.5)
WBC: 17.4 10*3/uL — ABNORMAL HIGH (ref 4.0–10.5)
nRBC: 0 % (ref 0.0–0.2)

## 2019-02-11 LAB — COMPREHENSIVE METABOLIC PANEL
ALT: 22 U/L (ref 0–44)
AST: 26 U/L (ref 15–41)
Albumin: 4.2 g/dL (ref 3.5–5.0)
Alkaline Phosphatase: 60 U/L (ref 38–126)
Anion gap: 7 (ref 5–15)
BUN: 9 mg/dL (ref 6–20)
CO2: 14 mmol/L — ABNORMAL LOW (ref 22–32)
Calcium: 9 mg/dL (ref 8.9–10.3)
Chloride: 116 mmol/L — ABNORMAL HIGH (ref 98–111)
Creatinine, Ser: 0.86 mg/dL (ref 0.61–1.24)
GFR calc Af Amer: >60 mL/min (ref >60)
GFR calc non Af Amer: >60 mL/min (ref >60)
Glucose, Bld: 145 mg/dL — ABNORMAL HIGH (ref 70–99)
Potassium: 4.3 mmol/L (ref 3.5–5.1)
Sodium: 137 mmol/L (ref 135–145)
Total Bilirubin: 1.6 mg/dL — ABNORMAL HIGH (ref 0.3–1.2)
Total Protein: 7.2 g/dL (ref 6.5–8.1)

## 2019-02-11 LAB — CBG MONITORING, ED
Glucose-Capillary: 125 mg/dL — ABNORMAL HIGH (ref 70–99)
Glucose-Capillary: 135 mg/dL — ABNORMAL HIGH (ref 70–99)
Glucose-Capillary: 138 mg/dL — ABNORMAL HIGH (ref 70–99)
Glucose-Capillary: 142 mg/dL — ABNORMAL HIGH (ref 70–99)
Glucose-Capillary: 143 mg/dL — ABNORMAL HIGH (ref 70–99)
Glucose-Capillary: 144 mg/dL — ABNORMAL HIGH (ref 70–99)
Glucose-Capillary: 144 mg/dL — ABNORMAL HIGH (ref 70–99)
Glucose-Capillary: 147 mg/dL — ABNORMAL HIGH (ref 70–99)
Glucose-Capillary: 147 mg/dL — ABNORMAL HIGH (ref 70–99)
Glucose-Capillary: 149 mg/dL — ABNORMAL HIGH (ref 70–99)
Glucose-Capillary: 158 mg/dL — ABNORMAL HIGH (ref 70–99)
Glucose-Capillary: 159 mg/dL — ABNORMAL HIGH (ref 70–99)
Glucose-Capillary: 162 mg/dL — ABNORMAL HIGH (ref 70–99)
Glucose-Capillary: 168 mg/dL — ABNORMAL HIGH (ref 70–99)
Glucose-Capillary: 173 mg/dL — ABNORMAL HIGH (ref 70–99)
Glucose-Capillary: 178 mg/dL — ABNORMAL HIGH (ref 70–99)
Glucose-Capillary: 184 mg/dL — ABNORMAL HIGH (ref 70–99)
Glucose-Capillary: 200 mg/dL — ABNORMAL HIGH (ref 70–99)
Glucose-Capillary: 216 mg/dL — ABNORMAL HIGH (ref 70–99)

## 2019-02-11 LAB — MAGNESIUM: Magnesium: 1.9 mg/dL (ref 1.7–2.4)

## 2019-02-11 LAB — SARS CORONAVIRUS 2 (TAT 6-24 HRS): SARS Coronavirus 2: NEGATIVE

## 2019-02-11 LAB — HEMOGLOBIN A1C
Hgb A1c MFr Bld: 10.9 % — ABNORMAL HIGH (ref 4.8–5.6)
Mean Plasma Glucose: 266.13 mg/dL

## 2019-02-11 LAB — PHOSPHORUS: Phosphorus: 1.9 mg/dL — ABNORMAL LOW (ref 2.5–4.6)

## 2019-02-11 MED ORDER — PROMETHAZINE HCL 25 MG/ML IJ SOLN
12.5000 mg | Freq: Four times a day (QID) | INTRAMUSCULAR | Status: AC | PRN
  Administered 2019-02-11 – 2019-02-12 (×2): 12.5 mg via INTRAVENOUS
  Filled 2019-02-11 (×2): qty 1

## 2019-02-11 MED ORDER — SODIUM PHOSPHATES 45 MMOLE/15ML IV SOLN
20.0000 mmol | Freq: Once | INTRAVENOUS | Status: AC
  Administered 2019-02-11: 09:00:00 20 mmol via INTRAVENOUS
  Filled 2019-02-11: qty 6.67

## 2019-02-11 MED ORDER — METOCLOPRAMIDE HCL 5 MG/ML IJ SOLN
10.0000 mg | Freq: Three times a day (TID) | INTRAMUSCULAR | Status: DC
  Administered 2019-02-11: 10 mg via INTRAVENOUS
  Filled 2019-02-11: qty 2

## 2019-02-11 NOTE — Progress Notes (Signed)
PROGRESS NOTE    Shawn Maldonado  OIZ:[id number] DOB: 12/25/1985 DOA: 02/10/2019 PCP: System, Pcp Not In   Brief Narrative:  Shawn Maldonado is a 33 y.o. male with medical history significant for but not limited to diabetes mellitus type 1 with history of diabetic gastroparesis, history of transaminitis and elevated LFTs, hyperbilirubinemia, peripheral neuropathy, history of diabetic ketoacidosis and uncontrolled diabetes mellitus type 1 with last hemoglobin A1c of 12.6 scented to the ED with a chief complaint of nausea or vomiting that started after he missed his insulin doses.  He woke up this morning feeling unwell and given a dose of Lantus but unclear how much he took and started having nausea and vomiting.  They also started having some general abdominal cramping and pain around the diarrhea cough and congestion.  Not been around anybody has been sick.  Recently discharge from the hospital Sep 29, 2018 after admission for DKA and he was seen in the ED in August.  Denies any lightheadedness or dizziness or chest pain currently but continues to be severely nauseous and doubled over about to vomit.  He denies any other concerns or complaints at this time and [facility] was asked to meet this patient for diabetic ketoacidosis and type I diabetic.  ED Course: In the ED he had basic blood work done and was given 2 L of normal saline boluses and started on maintenance IV fluid hydration and started on insulin drip.  I asked the ED provider to add on a UDS and his SARS-CoV-2 testing was pending  **Interim History   Continues to Vomit and AG is improved but CO2 is still not >20. Resting this AM after vomiting last night.   Assessment & Plan:   Active Problems:   DKA (diabetic ketoacidoses) (HCC)   DKA, type 1 (HCC)  DKA with hyperglycemia in a type 1 diabetic from likely noncompliance and missed insulin doses complicated by diabetic peripheral neuropathy -Placed in observation stepdown unit for now but will  admit to Inpatient now that he is expected to stay 2 midnights  -Given 2 L normal saline boluses in the ED and will start on maintenance IV fluid hydration with normal saline and changed to D5 half-normal saline once blood sugars are less than 250 -Continue with insulin drip at Endo tool is currently not yet live -We will need a diabetes education coordinator and appreciate Evaluation -BMPs every 4 hours -N.p.o. for now until no longer nauseous and vomiting  -Continue with antiemetics and supportive care -Beta hydroxybutyrate acid was 7.58 -UA was unremarkable except it did show some glucosuria -Chest x-ray is normal -Check UDS and still pending to be done -HbA1c was 10.9 -Blood Cx x2 pending  -SARS-CoV-2 Negative  -Continues to be extremely nauseous and will hold all medications by mouth for now -Transition to long-acting insulin once patient's gap is closed and his bicarbonate level is greater than 202 and is able to tolerate a diet -Continue to Monitor CBG's carefully and ranging from  125-162 now   Leukocytosis -In the setting of DKA and worsening from vomiting  -Patient's WBC was 12.7 and worsened to 17.4 -Continue monitor and trend for S/Sx of Infection and repeat CBC in a.m.  High anion gap metabolic acidosis -Patient CO2 was 8 and anion gap was 19 and chloride level was 111 on Admission -Now CO2 is 16, AG is 12, and Chloride level is 16 -Treatment as above  Intractable nausea and vomiting in the setting of his DKA -Continue with supportive  care and antiemetics -Will add IV Reglan as well at q8h -C/w Ondansetron 4 mg q6hprn Nausea and Vomiting  -Now on Promethazine 12.5 mg IV q6hprn  -Received Promethazine 12.5 mg last night   History of GERD and Diabetic Gastroparesis -Continue with IV pantoprazole 40 mg every 24 -Will add IV Metoclopramide 10 mg q8h -Avoid narcotics given slowing of gut motility and history of patient having opiate withdrawal  Hyperbilirubinemia  -Likely reactive in the setting of Vomiting -Patient's T Bili is now 1.6 -Continue to Monitor and Trend -Repeat CMP in AM   DVT prophylaxis: SCDs and Enoxaparin  Code Status: FULL CODE  Family Communication: No family present at bedside  Disposition Plan: Pending further improvement. Will admit to Inpatient   Consultants:   None   Procedures: None  Antimicrobials:  Anti-infectives (From admission, onward)   None     Subjective: Seen and examined at bedside and he was asleep when I woke him from sleep but he did not want to interact and just wanted to rest.  Vomited early this morning and does not appear to be in any acute distress as he is somnolent and sleepy.  Anion gap is proved but CO2 is still not greater than 20. No other concerns or complaints at this time.   Objective: Vitals:   02/11/19 0830 02/11/19 0900 02/11/19 0930 02/11/19 1000  BP: 126/82 121/72 137/83 132/82  Pulse: 95 81 97 85  Resp: 16 16 18  (!) 21  Temp:      TempSrc:      SpO2: 100% 100% 100% 100%  Weight:      Height:        Intake/Output Summary (Last 24 hours) at 02/11/2019 1137 Last data filed at 02/11/2019 0051 Gross per 24 hour  Intake 3720.16 ml  Output -  Net 3720.16 ml   Filed Weights   02/10/19 1314  Weight: 61.2 kg   Examination: Physical Exam:  Constitutional: Thin male in no acute distress appears more calm today and appears comfortable sleeping Eyes: Lids and conjunctivae normal, sclerae anicteric  ENMT: External Ears, Nose appear normal. Grossly normal hearing.  Neck: Appears normal, supple, no cervical masses, normal ROM, no appreciable thyromegaly; no JVD Respiratory: Diminishedto auscultation bilaterally, no wheezing, rales, rhonchi or crackles. Normal respiratory effort and patient is not tachypenic. No accessory muscle use.  Cardiovascular: RRR, no murmurs / rubs / gallops. S1 and S2 auscultated.  Abdomen: Soft, mildly-tender, non-distended. Bowel sounds positive x4.   GU: Deferred. Musculoskeletal: No clubbing / cyanosis of digits/nails. No contractures or cyanosis noted  Skin: No rashes, lesions, ulcers on a limited skin evaluation but has a Right Arm Sleeve tattoo noted again. No induration; Warm and dry.  Neurologic: CN 2-12 grossly intact with no focal deficits. Romberg sign and cerebellar reflexes not assessed.  Psychiatric: Normal judgment and insight. Drowsy and somnolent and was sleeping; When I awoke him he had a normal mood and appropriate affect.   Data Reviewed: I have personally reviewed following labs and imaging studies  CBC: Recent Labs  Lab 02/10/19 1331 02/11/19 0436  WBC 12.7* 17.4*  NEUTROABS 10.6* 15.0*  HGB 14.8 13.4  HCT 45.6 39.4  MCV 92.3 89.5  PLT 213 159   Basic Metabolic Panel: Recent Labs  Lab 02/10/19 1327 02/10/19 1700 02/10/19 1713 02/11/19 0138 02/11/19 0436 02/11/19 1106  NA 135  --  138 136 137 138  137  K 4.4  --  4.8 4.2 4.3 3.6  3.6  CL  104  --  111 113* 116* 110  109  CO2 10*  --  8* 12* 14* 16*  16*  GLUCOSE 398*  --  316* 173* 145* 153*  154*  BUN 15  --  CREATININE 0.95  --  0.83 0.86 0.86 0.75  0.73  CALCIUM 9.1  --  8.2* 8.9 9.0 8.8*  8.9  MG  --  2.0  --   --  1.9  --   PHOS  --  4.3  --   --  1.9*  --    GFR: Estimated Creatinine Clearance: 112.6 mL/min (by C-G formula based on SCr of 0.73 mg/dL). Liver Function Tests: Recent Labs  Lab 02/11/19 0436  AST 26  ALT 22  ALKPHOS 60  BILITOT 1.6*  PROT 7.2  ALBUMIN 4.2   No results for input(s): LIPASE, AMYLASE in the last 168 hours. No results for input(s): AMMONIA in the last 168 hours. Coagulation Profile: No results for input(s): INR, PROTIME in the last 168 hours. Cardiac Enzymes: No results for input(s): CKTOTAL, CKMB, CKMBINDEX, TROPONINI in the last 168 hours. BNP (last 3 results) No results for input(s): PROBNP in the last 8760 hours. HbA1C: Recent Labs    02/11/19 0436  HGBA1C 10.9*   CBG:  Recent Labs  Lab 02/11/19 0750 02/11/19 0844 02/11/19 0901 02/11/19 1007 02/11/19 1114  GLUCAP 142* 135* 125* 138* 143*   Lipid Profile: No results for input(s): CHOL, HDL, LDLCALC, TRIG, CHOLHDL, LDLDIRECT in the last 72 hours. Thyroid Function Tests: No results for input(s): TSH, T4TOTAL, FREET4, T3FREE, THYROIDAB in the last 72 hours. Anemia Panel: No results for input(s): VITAMINB12, FOLATE, FERRITIN, TIBC, IRON, RETICCTPCT in the last 72 hours. Sepsis Labs: No results for input(s): PROCALCITON, LATICACIDVEN in the last 168 hours.  Recent Results (from the past 240 hour(s))  SARS CORONAVIRUS 2 (TAT 6-24 HRS) Nasopharyngeal Nasopharyngeal Swab     Status: None   Collection Time: 02/10/19  6:33 PM   Specimen: Nasopharyngeal Swab  Result Value Ref Range Status   SARS Coronavirus 2 NEGATIVE NEGATIVE Final    Comment: (NOTE) SARS-CoV-2 target nucleic acids are NOT DETECTED. The SARS-CoV-2 RNA is generally detectable in upper and lower respiratory specimens during the acute phase of infection. Negative results do not preclude SARS-CoV-2 infection, do not rule out co-infections with other pathogens, and should not be used as the sole basis for treatment or other patient management decisions. Negative results must be combined with clinical observations, patient history, and epidemiological information. The expected result is Negative. Fact Sheet for Patients: HairSlick.no Fact Sheet for Healthcare Providers: quierodirigir.com This test is not yet approved or cleared by the [place] FDA and  has been authorized for detection and/or diagnosis of SARS-CoV-2 by FDA under an Emergency Use Authorization (EUA). This EUA will remain  in effect (meaning this test can be used) for the duration of the COVID-19 declaration under Section 56 4(b)(1) of the Act, 21 U.S.C. section 360bbb-3(b)(1), unless the authorization is terminated  or revoked sooner. Performed at [facility], [address] [place], [place] [address]     Radiology Studies: Dg Chest Portable 1 View  Result Date: 02/10/2019 CLINICAL DATA:  Weakness, nausea and vomiting. EXAM: PORTABLE CHEST 1 VIEW COMPARISON:  July 27, 2018 FINDINGS: Cardiomediastinal silhouette is normal. Mediastinal contours appear intact. There is no evidence of focal airspace consolidation, pleural effusion or pneumothorax. Osseous structures are without acute abnormality. Soft tissues  are grossly normal. IMPRESSION: No active disease. Electronically Signed   By: Fidela Salisbury M.D.   On: 02/10/2019 15:55   Scheduled Meds: . enoxaparin (LOVENOX) injection  40 mg Subcutaneous Q24H  . pantoprazole (PROTONIX) IV  40 mg Intravenous Q24H   Continuous Infusions: . sodium chloride 100 mL/hr at 02/10/19 2201  . sodium chloride    . dextrose 5 % and 0.45% NaCl 100 mL/hr at 02/10/19 2201  . dextrose 5 % and 0.45% NaCl 100 mL/hr at 02/11/19 0605  . insulin 0.8 Units/hr (02/11/19 1478)  . sodium phosphate  Dextrose 5% IVPB 20 mmol (02/11/19 0925)    LOS: 0 days    Kerney Elbe, DO [facility] PAGER is on [place]  If 7PM-7AM, please contact night-coverage [email or web address] Password [email or web address] 02/11/2019, 11:37 AM

## 2019-02-11 NOTE — Progress Notes (Addendum)
Inpatient Diabetes Program Recommendations  AACE/ADA: New Consensus Statement on Inpatient Glycemic Control (2015)  Target Ranges:  Prepandial:   less than 140 mg/dL      Peak postprandial:   less than 180 mg/dL (1-2 hours)      Critically ill patients:  140 - 180 mg/dL   Results for Shawn Maldonado, Shawn Maldonado (MRN [id number]) as of 02/11/2019 08:27  Ref. Range 02/11/2019 03:32 02/11/2019 04:45 02/11/2019 05:50 02/11/2019 06:41 02/11/2019 07:50  Glucose-Capillary Latest Ref Range: 70 - 99 mg/dL 147 (H) 147 (H) 159 (H) 149 (H) 142 (H)   Results for Shawn Maldonado, Shawn Maldonado (MRN [id number]) as of 02/11/2019 08:27  Ref. Range 02/10/2019 13:31  Beta-Hydroxybutyric Acid Latest Ref Range: 0.05 - 0.27 mmol/L 7.58 (H)   Results for Shawn Maldonado, Shawn Maldonado (MRN [id number]) as of 02/11/2019 08:27  Ref. Range 07/27/2018 16:44 02/11/2019 04:36  Hemoglobin A1C Latest Ref Range: 4.8 - 5.6 % 12.6 (H) 10.9 (H)  (266 mg/dl)    To ED via EMS with N&V/ Hyperglycemia/ DKA (left insulin at work and didn't take)  History: T1DM   Home DM Meds: Lantus 20 units Daily        Novolog 4 units TID            Novolog SSI   Current Orders: IV Insulin Drip (started yest at 6pm)     MD- Note 4:30am BMET shows Anion Gap down to 9, however, CO2 only 14 this AM.  Per DKA guidelines, recommended to wait to to transition to SQ Insulin until CO2 is at least 20.  When patient's BMET shows further improvement and patient is without vomiting (per RN notes, pt with profuse vomiting at 5:45am today), can begin transition to Lantus and Novolog.    Addendum 11:50am- Checked 11am BMET--Anion Gap 12 but CO2 level still low at 16.  Recommend to continue on IV Insulin Drip until BMET shows further improvement.    --Will follow patient during hospitalization--  Wyn Quaker RN, MSN, CDE Diabetes Coordinator Inpatient Glycemic Control Team Team Pager: [phone] (8a-5p)

## 2019-02-11 NOTE — Progress Notes (Signed)
Patient vomiting profusely. Again, too early to give zofran. Paged K. Schorr. New order for IV phenergan. Will continue to monitor.

## 2019-02-11 NOTE — ED Notes (Signed)
Claybon Jabs Attending provider notified of [place] increasing. Advised by provider unable to feed patient until bicarb comes down.

## 2019-02-12 DIAGNOSIS — E1143 Type 2 diabetes mellitus with diabetic autonomic (poly)neuropathy: Secondary | ICD-10-CM

## 2019-02-12 DIAGNOSIS — R112 Nausea with vomiting, unspecified: Secondary | ICD-10-CM

## 2019-02-12 DIAGNOSIS — K3184 Gastroparesis: Secondary | ICD-10-CM

## 2019-02-12 LAB — COMPREHENSIVE METABOLIC PANEL
ALT: 20 U/L (ref 0–44)
AST: 21 U/L (ref 15–41)
Albumin: 4.1 g/dL (ref 3.5–5.0)
Alkaline Phosphatase: 59 U/L (ref 38–126)
Anion gap: 9 (ref 5–15)
BUN: 13 mg/dL (ref 6–20)
CO2: 21 mmol/L — ABNORMAL LOW (ref 22–32)
Calcium: 9.1 mg/dL (ref 8.9–10.3)
Chloride: 108 mmol/L (ref 98–111)
Creatinine, Ser: 0.67 mg/dL (ref 0.61–1.24)
GFR calc Af Amer: >60 mL/min (ref >60)
GFR calc non Af Amer: >60 mL/min (ref >60)
Glucose, Bld: 170 mg/dL — ABNORMAL HIGH (ref 70–99)
Potassium: 3.1 mmol/L — ABNORMAL LOW (ref 3.5–5.1)
Sodium: 138 mmol/L (ref 135–145)
Total Bilirubin: 2.1 mg/dL — ABNORMAL HIGH (ref 0.3–1.2)
Total Protein: 6.9 g/dL (ref 6.5–8.1)

## 2019-02-12 LAB — BASIC METABOLIC PANEL
Anion gap: 11 (ref 5–15)
Anion gap: 14 (ref 5–15)
Anion gap: 10 (ref 5–15)
BUN: 13 mg/dL (ref 6–20)
BUN: 13 mg/dL (ref 6–20)
BUN: 13 mg/dL (ref 6–20)
CO2: 17 mmol/L — ABNORMAL LOW (ref 22–32)
CO2: 18 mmol/L — ABNORMAL LOW (ref 22–32)
CO2: 21 mmol/L — ABNORMAL LOW (ref 22–32)
Calcium: 8.9 mg/dL (ref 8.9–10.3)
Calcium: 9 mg/dL (ref 8.9–10.3)
Calcium: 8.8 mg/dL — ABNORMAL LOW (ref 8.9–10.3)
Chloride: 108 mmol/L (ref 98–111)
Chloride: 106 mmol/L (ref 98–111)
Chloride: 106 mmol/L (ref 98–111)
Creatinine, Ser: 0.73 mg/dL (ref 0.61–1.24)
Creatinine, Ser: 0.62 mg/dL (ref 0.61–1.24)
Creatinine, Ser: 0.61 mg/dL (ref 0.61–1.24)
GFR calc Af Amer: >60 mL/min (ref >60)
GFR calc Af Amer: >60 mL/min (ref >60)
GFR calc Af Amer: >60 mL/min (ref >60)
GFR calc non Af Amer: >60 mL/min (ref >60)
GFR calc non Af Amer: >60 mL/min (ref >60)
GFR calc non Af Amer: >60 mL/min (ref >60)
Glucose, Bld: 219 mg/dL — ABNORMAL HIGH (ref 70–99)
Glucose, Bld: 146 mg/dL — ABNORMAL HIGH (ref 70–99)
Glucose, Bld: 118 mg/dL — ABNORMAL HIGH (ref 70–99)
Potassium: 3.4 mmol/L — ABNORMAL LOW (ref 3.5–5.1)
Potassium: 3.4 mmol/L — ABNORMAL LOW (ref 3.5–5.1)
Potassium: 2.8 mmol/L — ABNORMAL LOW (ref 3.5–5.1)
Sodium: 136 mmol/L (ref 135–145)
Sodium: 138 mmol/L (ref 135–145)
Sodium: 137 mmol/L (ref 135–145)

## 2019-02-12 LAB — CBG MONITORING, ED
Glucose-Capillary: 102 mg/dL — ABNORMAL HIGH (ref 70–99)
Glucose-Capillary: 110 mg/dL — ABNORMAL HIGH (ref 70–99)
Glucose-Capillary: 112 mg/dL — ABNORMAL HIGH (ref 70–99)
Glucose-Capillary: 129 mg/dL — ABNORMAL HIGH (ref 70–99)
Glucose-Capillary: 150 mg/dL — ABNORMAL HIGH (ref 70–99)
Glucose-Capillary: 156 mg/dL — ABNORMAL HIGH (ref 70–99)
Glucose-Capillary: 164 mg/dL — ABNORMAL HIGH (ref 70–99)
Glucose-Capillary: 164 mg/dL — ABNORMAL HIGH (ref 70–99)
Glucose-Capillary: 187 mg/dL — ABNORMAL HIGH (ref 70–99)
Glucose-Capillary: 194 mg/dL — ABNORMAL HIGH (ref 70–99)
Glucose-Capillary: 203 mg/dL — ABNORMAL HIGH (ref 70–99)
Glucose-Capillary: 203 mg/dL — ABNORMAL HIGH (ref 70–99)
Glucose-Capillary: 204 mg/dL — ABNORMAL HIGH (ref 70–99)
Glucose-Capillary: 245 mg/dL — ABNORMAL HIGH (ref 70–99)
Glucose-Capillary: 260 mg/dL — ABNORMAL HIGH (ref 70–99)

## 2019-02-12 LAB — CBC WITH DIFFERENTIAL/PLATELET
Abs Immature Granulocytes: 0.07 10*3/uL (ref 0.00–0.07)
Basophils Absolute: 0 10*3/uL (ref 0.0–0.1)
Basophils Relative: 0 %
Eosinophils Absolute: 0 10*3/uL (ref 0.0–0.5)
Eosinophils Relative: 0 %
HCT: 38.4 % — ABNORMAL LOW (ref 39.0–52.0)
Hemoglobin: 13.4 g/dL (ref 13.0–17.0)
Immature Granulocytes: 0 %
Lymphocytes Relative: 11 %
Lymphs Abs: 1.8 10*3/uL (ref 0.7–4.0)
MCH: 30.3 pg (ref 26.0–34.0)
MCHC: 34.9 g/dL (ref 30.0–36.0)
MCV: 86.9 fL (ref 80.0–100.0)
Monocytes Absolute: 1.4 10*3/uL — ABNORMAL HIGH (ref 0.1–1.0)
Monocytes Relative: 9 %
Neutro Abs: 12.9 10*3/uL — ABNORMAL HIGH (ref 1.7–7.7)
Neutrophils Relative %: 80 %
Platelets: 218 10*3/uL (ref 150–400)
RBC: 4.42 MIL/uL (ref 4.22–5.81)
RDW: 12 % (ref 11.5–15.5)
WBC: 16.2 10*3/uL — ABNORMAL HIGH (ref 4.0–10.5)
nRBC: 0 % (ref 0.0–0.2)

## 2019-02-12 LAB — BILIRUBIN, FRACTIONATED(TOT/DIR/INDIR)
Bilirubin, Direct: 0.4 mg/dL — ABNORMAL HIGH (ref 0.0–0.2)
Indirect Bilirubin: 1.4 mg/dL — ABNORMAL HIGH (ref 0.3–0.9)
Total Bilirubin: 1.8 mg/dL — ABNORMAL HIGH (ref 0.3–1.2)

## 2019-02-12 LAB — PHOSPHORUS: Phosphorus: 2 mg/dL — ABNORMAL LOW (ref 2.5–4.6)

## 2019-02-12 LAB — MRSA PCR SCREENING: MRSA by PCR: NEGATIVE

## 2019-02-12 LAB — GLUCOSE, CAPILLARY
Glucose-Capillary: 114 mg/dL — ABNORMAL HIGH (ref 70–99)
Glucose-Capillary: 66 mg/dL — ABNORMAL LOW (ref 70–99)
Glucose-Capillary: 141 mg/dL — ABNORMAL HIGH (ref 70–99)

## 2019-02-12 LAB — LACTATE DEHYDROGENASE: LDH: 127 U/L (ref 98–192)

## 2019-02-12 LAB — MAGNESIUM: Magnesium: 1.9 mg/dL (ref 1.7–2.4)

## 2019-02-12 MED ORDER — POTASSIUM PHOSPHATE MONOBASIC 500 MG PO TABS
500.0000 mg | ORAL_TABLET | Freq: Three times a day (TID) | ORAL | Status: AC
  Administered 2019-02-12: 09:00:00 500 mg via ORAL
  Filled 2019-02-12 (×3): qty 1

## 2019-02-12 MED ORDER — METOCLOPRAMIDE HCL 10 MG PO TABS: 10.0000 mg | ORAL_TABLET | Freq: Three times a day (TID) | ORAL | Status: DC | PRN

## 2019-02-12 MED ORDER — CHLORHEXIDINE GLUCONATE CLOTH 2 % EX PADS
6.0000 | MEDICATED_PAD | Freq: Every day | CUTANEOUS | Status: DC
  Administered 2019-02-12 – 2019-02-13 (×2): 6 via TOPICAL

## 2019-02-12 MED ORDER — POTASSIUM CHLORIDE 10 MEQ/100ML IV SOLN
10.0000 meq | INTRAVENOUS | Status: AC
  Administered 2019-02-12 – 2019-02-13 (×4): 10 meq via INTRAVENOUS
  Filled 2019-02-12 (×4): qty 100

## 2019-02-12 MED ORDER — INSULIN ASPART 100 UNIT/ML ~~LOC~~ SOLN
4.0000 [IU] | Freq: Three times a day (TID) | SUBCUTANEOUS | Status: DC
  Administered 2019-02-13 – 2019-02-14 (×3): 4 [IU] via SUBCUTANEOUS

## 2019-02-12 MED ORDER — INSULIN GLARGINE 100 UNIT/ML ~~LOC~~ SOLN
20.0000 [IU] | Freq: Every day | SUBCUTANEOUS | Status: DC
  Administered 2019-02-12: 09:00:00 20 [IU] via SUBCUTANEOUS
  Filled 2019-02-12 (×2): qty 0.2

## 2019-02-12 MED ORDER — POTASSIUM CHLORIDE CRYS ER 20 MEQ PO TBCR: 40.0000 meq | EXTENDED_RELEASE_TABLET | Freq: Once | ORAL | Status: DC

## 2019-02-12 MED ORDER — POTASSIUM CHLORIDE CRYS ER 20 MEQ PO TBCR: 40.0000 meq | EXTENDED_RELEASE_TABLET | ORAL | Status: DC

## 2019-02-12 MED ORDER — DEXTROSE 50 % IV SOLN
INTRAVENOUS | Status: AC
  Administered 2019-02-12: 18:00:00 14 mL
  Filled 2019-02-12: qty 50

## 2019-02-12 MED ORDER — INSULIN ASPART 100 UNIT/ML ~~LOC~~ SOLN
0.0000 [IU] | Freq: Three times a day (TID) | SUBCUTANEOUS | Status: DC
  Administered 2019-02-13: 12:00:00 2 [IU] via SUBCUTANEOUS
  Administered 2019-02-13: 08:00:00 3 [IU] via SUBCUTANEOUS
  Administered 2019-02-14: 17:00:00 7 [IU] via SUBCUTANEOUS
  Administered 2019-02-14: 09:00:00 3 [IU] via SUBCUTANEOUS

## 2019-02-12 MED ORDER — INSULIN ASPART 100 UNIT/ML ~~LOC~~ SOLN: 0.0000 [IU] | Freq: Every day | SUBCUTANEOUS | Status: DC

## 2019-02-12 NOTE — ED Notes (Signed)
Spoke with attending re patient status. Patient give 20 regular insulin. Patient approved to eat carb modified diet. Attending MD will monitor patient - plan is to see if patient wean from insulin drip and possibly be placed in [facility] as opposed to ICU.

## 2019-02-12 NOTE — ED Notes (Signed)
Patient tolerated breakfast. ambulated to bathroom. Clean bed linens applied while patient out of bed. Patient declined lunch, said it was too heavy. Requested sugar free jello.

## 2019-02-12 NOTE — Progress Notes (Signed)
PROGRESS NOTE    Shawn Maldonado  FAO:[id number] DOB: [birth year] DOA: 02/10/2019 PCP: System, Pcp Not In   Brief Narrative: Shawn Maldonado is a 33 y.o. malewith medical history significantfor but not limited to diabetes mellitus type 1 with history of diabetic gastroparesis, history of transaminitis and elevated LFTs, hyperbilirubinemia, peripheral neuropathy. Patient presented secondary to nausea and vomiting and found to be in DKA.   Assessment & Plan:   Active Problems:   DKA (diabetic ketoacidoses) (HCC)   DKA, type 1 (HCC)   DKA Uncontrolled diabetes mellitus, type 1 Diabetic peripheral neuropathy Patient managed on insulin drip with eventual closure of anion gap and improvement of acidosis. DKA precipitated by medication non-adherence -Transition to home Lantus 20 units daily, carb modified diet, continue insulin drip and D5 IV fluids as overlap  Leukocytosis Likely related to DKA. No evidence of infection. Blood cultures obtained on admission are no growth to date -Continue to monitor; may need outpatient workup if persistent.   Intractable nausea and vomiting Secondary to DKA and likely complicated by history of gastroparesis. Resolved. -Continue Reglan  GERD Diabetic gastroparesis -Continue Protonix and Reglan  Hyperbilirubinemia Trended up. No associated elevation of AST/ALT. Hemoglobin appears stable. 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 -Fractionated bilirubin, LDH, haptoglobin -Will need outpatient workup   DVT prophylaxis: Lovenox Code Status:   Code Status: Full Code Family Communication: None at bedside Disposition Plan: Discharge home likely in 24 hours pending continued control of blood sugar   Consultants:   None  Procedures:   Insulin drip  Antimicrobials:  None    Subjective: No issues overnight. Nausea/vomiting improved. Hungry.  Objective: Vitals:   02/12/19 0430 02/12/19 0500  02/12/19 0700 02/12/19 0751  BP: 135/85 130/80 127/82 127/82  Pulse: 75 78 72 64  Resp:  (!) 25 12 10   Temp:      TempSrc:      SpO2: 100% 100% 100% 100%  Weight:      Height:        Intake/Output Summary (Last 24 hours) at 02/12/2019 0753 Last data filed at 02/12/2019 0206 Gross per 24 hour  Intake 1111.25 ml  Output -  Net 1111.25 ml   Filed Weights   02/10/19 1314  Weight: 61.2 kg    Examination:  General exam: Appears calm and comfortable Respiratory system: Clear to auscultation. Respiratory effort normal. Cardiovascular system: S1 & S2 heard, RRR. No murmurs, rubs, gallops or clicks. Gastrointestinal system: Abdomen is nondistended, soft and nontender. No organomegaly or masses felt. Normal bowel sounds heard. Central nervous system: Alert and oriented. No focal neurological deficits. Extremities: No edema. No calf tenderness Skin: No cyanosis. No rashes Psychiatry: Judgement and insight appear normal. Mood & affect appropriate.     Data Reviewed: I have personally reviewed following labs and imaging studies  CBC: Recent Labs  Lab 02/10/19 1331 02/11/19 0436 02/12/19 0450  WBC 12.7* 17.4* 16.2*  NEUTROABS 10.6* 15.0* 12.9*  HGB 14.8 13.4 13.4  HCT 45.6 39.4 38.4*  MCV 92.3 89.5 86.9  PLT 213 159 218   Basic Metabolic Panel: Recent Labs  Lab 02/10/19 1700  02/11/19 0436 02/11/19 1106 02/11/19 2313 02/12/19 0258 02/12/19 0450  NA  --    < > 137 138  137 137 136 138  K  --    < > 4.3 3.6  3.6 3.6 3.4* 3.1*  CL  --    < > 116* 110  109 109 108 108  CO2  --    < > 14* 16*  16* 16* 17* 21*  GLUCOSE  --    < > 145* 153*  154* 241* 219* 170*  BUN  --    < > 9 9  9 12 13 13   CREATININE  --    < > 0.86 0.75  0.73 0.85 0.73 0.67  CALCIUM  --    < > 9.0 8.8*  8.9 9.0 8.9 9.1  MG 2.0  --  1.9  --   --   --  1.9  PHOS 4.3  --  1.9*  --   --   --  2.0*   < > = values in this interval not displayed.   GFR: Estimated Creatinine Clearance: 112.6  mL/min (by C-G formula based on SCr of 0.67 mg/dL). Liver Function Tests: Recent Labs  Lab 02/11/19 0436 02/12/19 0450  AST 26 21  ALT 22 20  ALKPHOS 60 59  BILITOT 1.6* 2.1*  PROT 7.2 6.9  ALBUMIN 4.2 4.1   No results for input(s): LIPASE, AMYLASE in the last 168 hours. No results for input(s): AMMONIA in the last 168 hours. Coagulation Profile: No results for input(s): INR, PROTIME in the last 168 hours. Cardiac Enzymes: No results for input(s): CKTOTAL, CKMB, CKMBINDEX, TROPONINI in the last 168 hours. BNP (last 3 results) No results for input(s): PROBNP in the last 8760 hours. HbA1C: Recent Labs    02/11/19 0436  HGBA1C 10.9*   CBG: Recent Labs  Lab 02/12/19 0341 02/12/19 0449 02/12/19 0552 02/12/19 0657 02/12/19 0735  GLUCAP 203* 164* 129* 102* 112*   Lipid Profile: No results for input(s): CHOL, HDL, LDLCALC, TRIG, CHOLHDL, LDLDIRECT in the last 72 hours. Thyroid Function Tests: No results for input(s): TSH, T4TOTAL, FREET4, T3FREE, THYROIDAB in the last 72 hours. Anemia Panel: No results for input(s): VITAMINB12, FOLATE, FERRITIN, TIBC, IRON, RETICCTPCT in the last 72 hours. Sepsis Labs: No results for input(s): PROCALCITON, LATICACIDVEN in the last 168 hours.  Recent Results (from the past 240 hour(s))  SARS CORONAVIRUS 2 (TAT 6-24 HRS) Nasopharyngeal Nasopharyngeal Swab     Status: None   Collection Time: 02/10/19  6:33 PM   Specimen: Nasopharyngeal Swab  Result Value Ref Range Status   SARS Coronavirus 2 NEGATIVE NEGATIVE Final    Comment: (NOTE) SARS-CoV-2 target nucleic acids are NOT DETECTED. The SARS-CoV-2 RNA is generally detectable in upper and lower respiratory specimens during the acute phase of infection. Negative results do not preclude SARS-CoV-2 infection, do not rule out co-infections with other pathogens, and should not be used as the sole basis for treatment or other patient management decisions. Negative results must be combined  with clinical observations, patient history, and epidemiological information. The expected result is Negative. Fact Sheet for Patients: SugarRoll.be Fact Sheet for Healthcare Providers: https://www.woods-mathews.com/ This test is not yet approved or cleared by the [place] FDA and  has been authorized for detection and/or diagnosis of SARS-CoV-2 by FDA under an Emergency Use Authorization (EUA). This EUA will remain  in effect (meaning this test can be used) for the duration of the COVID-19 declaration under Section 56 4(b)(1) of the Act, 21 U.S.C. section 360bbb-3(b)(1), unless the authorization is terminated or revoked sooner. Performed at [facility], [place] [address] [place], [place] [address]   Culture, blood (routine x 2)     Status: None (Preliminary result)   Collection Time: 02/10/19  9:38 PM   Specimen: BLOOD RIGHT HAND  Result  Value Ref Range Status   Specimen Description   Final    BLOOD RIGHT HAND Performed at [facility], [address], [place], [place] [address]    Special Requests   Final    BOTTLES DRAWN AEROBIC AND ANAEROBIC Blood Culture adequate volume Performed at [facility], [address] [place], [place] [address]    Culture   Final    NO GROWTH < 24 HOURS Performed at [facility], [address], [place], [place] [address]    Report Status PENDING  Incomplete  Culture, blood (routine x 2)     Status: None (Preliminary result)   Collection Time: 02/10/19  9:43 PM   Specimen: BLOOD  Result Value Ref Range Status   Specimen Description   Final    BLOOD BLOOD RIGHT FOREARM Performed at [facility], [address] [place], [place] [address]    Special Requests   Final    BOTTLES DRAWN AEROBIC AND ANAEROBIC Blood Culture results may not be optimal due to an inadequate volume of blood received in culture bottles Performed at [facility], [address] [place], [place] [address]    Culture   Final    NO GROWTH < 24 HOURS Performed at [facility], [address] [place], [place] [address]    Report Status PENDING  Incomplete         Radiology Studies: Dg Chest Portable 1 View  Result Date: 02/10/2019 CLINICAL DATA:  Weakness, nausea and vomiting. EXAM: PORTABLE CHEST 1 VIEW COMPARISON:  July 27, 2018 FINDINGS: Cardiomediastinal silhouette is normal. Mediastinal contours appear intact. There is no evidence of focal airspace consolidation, pleural effusion or pneumothorax. Osseous structures are without acute abnormality. Soft tissues are grossly normal. IMPRESSION: No active disease. Electronically Signed   By: Ted Mcalpine M.D.   On: 02/10/2019 15:55        Scheduled Meds: . enoxaparin (LOVENOX) injection  40 mg Subcutaneous Q24H  . insulin glargine  20 Units Subcutaneous Daily  . metoCLOPramide (REGLAN) injection  10 mg Intravenous Q8H  . pantoprazole (PROTONIX) IV  40 mg Intravenous Q24H  . potassium phosphate (monobasic)  500 mg Oral TID WC   Continuous Infusions: . dextrose 5 % and 0.45% NaCl 100 mL/hr at 02/12/19 0206  . insulin 0.5 Units/hr (02/12/19 0749)     LOS: 1 day     Jacquelin Hawking, MD [facility] 02/12/2019, 7:53 AM  If 7PM-7AM, please contact night-coverage [email or web address]

## 2019-02-12 NOTE — ED Notes (Signed)
Contacted Attending regarding current CBG level and patient request to eat.

## 2019-02-13 ENCOUNTER — Other Ambulatory Visit: Payer: Self-pay

## 2019-02-13 LAB — GLUCOSE, CAPILLARY
Glucose-Capillary: 237 mg/dL — ABNORMAL HIGH (ref 70–99)
Glucose-Capillary: 199 mg/dL — ABNORMAL HIGH (ref 70–99)
Glucose-Capillary: 83 mg/dL (ref 70–99)

## 2019-02-13 LAB — BASIC METABOLIC PANEL
Anion gap: 10 (ref 5–15)
BUN: 14 mg/dL (ref 6–20)
CO2: 22 mmol/L (ref 22–32)
Calcium: 8.6 mg/dL — ABNORMAL LOW (ref 8.9–10.3)
Chloride: 104 mmol/L (ref 98–111)
Creatinine, Ser: 0.64 mg/dL (ref 0.61–1.24)
GFR calc Af Amer: >60 mL/min (ref >60)
GFR calc non Af Amer: >60 mL/min (ref >60)
Glucose, Bld: 222 mg/dL — ABNORMAL HIGH (ref 70–99)
Potassium: 3.1 mmol/L — ABNORMAL LOW (ref 3.5–5.1)
Sodium: 136 mmol/L (ref 135–145)

## 2019-02-13 LAB — HEPATIC FUNCTION PANEL
ALT: 21 U/L (ref 0–44)
AST: 17 U/L (ref 15–41)
Albumin: 3.8 g/dL (ref 3.5–5.0)
Alkaline Phosphatase: 53 U/L (ref 38–126)
Bilirubin, Direct: 0.3 mg/dL — ABNORMAL HIGH (ref 0.0–0.2)
Indirect Bilirubin: 2 mg/dL — ABNORMAL HIGH (ref 0.3–0.9)
Total Bilirubin: 2.3 mg/dL — ABNORMAL HIGH (ref 0.3–1.2)
Total Protein: 6.6 g/dL (ref 6.5–8.1)

## 2019-02-13 LAB — HAPTOGLOBIN: Haptoglobin: 116 mg/dL (ref 17–317)

## 2019-02-13 MED ORDER — INSULIN GLARGINE 100 UNIT/ML ~~LOC~~ SOLN
25.0000 [IU] | Freq: Every day | SUBCUTANEOUS | Status: DC
  Administered 2019-02-13: 10:00:00 25 [IU] via SUBCUTANEOUS
  Filled 2019-02-13 (×2): qty 0.25

## 2019-02-13 MED ORDER — METOCLOPRAMIDE HCL 10 MG PO TABS
10.0000 mg | ORAL_TABLET | Freq: Three times a day (TID) | ORAL | Status: DC
  Administered 2019-02-14: 10 mg via ORAL
  Filled 2019-02-13: qty 1

## 2019-02-13 MED ORDER — POTASSIUM CHLORIDE CRYS ER 20 MEQ PO TBCR
40.0000 meq | EXTENDED_RELEASE_TABLET | ORAL | Status: AC
  Administered 2019-02-13 (×2): 40 meq via ORAL
  Filled 2019-02-13 (×2): qty 2

## 2019-02-13 NOTE — Progress Notes (Signed)
PROGRESS NOTE    Shawn Maldonado  NAT:[id number] DOB: [birth year] DOA: 02/10/2019 PCP: System, Pcp Not In   Brief Narrative: Shawn Maldonado is a 33 y.o. malewith medical history significantfor but not limited to diabetes mellitus type 1 with history of diabetic gastroparesis, history of transaminitis and elevated LFTs, hyperbilirubinemia, peripheral neuropathy. Patient presented secondary to nausea and vomiting and found to be in DKA.   Assessment & Plan:   Active Problems:   DKA (diabetic ketoacidoses) (HCC)   DKA, type 1 (HCC)   DKA Uncontrolled diabetes mellitus, type 1 Diabetic peripheral neuropathy Patient managed on insulin drip with eventual closure of anion gap and improvement of acidosis. DKA precipitated by medication non-adherence -Increase to Lantus 25 units daily; continue carb modified -Continue SSI  Leukocytosis Likely related to DKA. No evidence of infection. Blood cultures obtained on admission are no growth to date -Continue to monitor; may need outpatient workup if persistent.   Intractable nausea and vomiting Secondary to DKA and likely complicated by history of gastroparesis. Resolved. -Continue Reglan  GERD Diabetic gastroparesis Patient without formal diagnosis of gastroparesis. Still having significant nausea preventing him from eating full meals -Continue Protonix and Reglan -Gastric emptying study  Hyperbilirubinemia Trended up. No associated elevation of AST/ALT. Hemoglobin appears stable. 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 -Hepatic function panel today and CMP in AM -Will need outpatient workup   DVT prophylaxis: Lovenox Code Status:   Code Status: Full Code Family Communication: None at bedside Disposition Plan: Discharge home pending improvement of nausea   Consultants:   None  Procedures:   Insulin drip  Antimicrobials:  None    Subjective: Nausea impeding his ability to  tolerate oral intake.  Objective: Vitals:   02/13/19 0801 02/13/19 0900 02/13/19 1200 02/13/19 1300  BP:  (!) 143/80 120/68 (!) 158/97  Pulse: 60 (!) 59 61 67  Resp:      Temp:   98.1 F (36.7 C)   TempSrc:   Oral   SpO2: 100% 100% 99% 100%  Weight:      Height:        Intake/Output Summary (Last 24 hours) at 02/13/2019 1449 Last data filed at 02/13/2019 0333 Gross per 24 hour  Intake 170.04 ml  Output 500 ml  Net -329.96 ml   Filed Weights   02/10/19 1314  Weight: 61.2 kg    Examination:  General exam: Appears calm and comfortable Respiratory system: Clear to auscultation. Respiratory effort normal. Cardiovascular system: S1 & S2 heard, RRR. No murmurs, rubs, gallops or clicks. Gastrointestinal system: Abdomen is nondistended, soft and nontender. No organomegaly or masses felt. Normal bowel sounds heard. Central nervous system: Alert and oriented. No focal neurological deficits. Extremities: No edema. No calf tenderness Skin: No cyanosis. No rashes Psychiatry: Judgement and insight appear normal. Mood & affect appropriate.    Data Reviewed: I have personally reviewed following labs and imaging studies  CBC: Recent Labs  Lab 02/10/19 1331 02/11/19 0436 02/12/19 0450  WBC 12.7* 17.4* 16.2*  NEUTROABS 10.6* 15.0* 12.9*  HGB 14.8 13.4 13.4  HCT 45.6 39.4 38.4*  MCV 92.3 89.5 86.9  PLT 213 159 218   Basic Metabolic Panel: Recent Labs  Lab 02/10/19 1700  02/11/19 0436  02/12/19 0258 02/12/19 0450 02/12/19 0839 02/12/19 1807 02/13/19 0201  NA  --    < > 137   < > 136 138 138 137 136  K  --    < > 4.3   < >  3.4* 3.1* 3.4* 2.8* 3.1*  CL  --    < > 116*   < > 108 108 106 106 104  CO2  --    < > 14*   < > 17* 21* 18* 21* 22  GLUCOSE  --    < > 145*   < > 219* 170* 146* 118* 222*  BUN  --    < > 9   < > 13 13 13 13 14   CREATININE  --    < > 0.86   < > 0.73 0.67 0.62 0.61 0.64  CALCIUM  --    < > 9.0   < > 8.9 9.1 9.0 8.8* 8.6*  MG 2.0  --  1.9  --   --   1.9  --   --   --   PHOS 4.3  --  1.9*  --   --  2.0*  --   --   --    < > = values in this interval not displayed.   GFR: Estimated Creatinine Clearance: 112.6 mL/min (by C-G formula based on SCr of 0.64 mg/dL). Liver Function Tests: Recent Labs  Lab 02/11/19 0436 02/12/19 0450 02/12/19 0839  AST 26 21  --   ALT 22 20  --   ALKPHOS 60 59  --   BILITOT 1.6* 2.1* 1.8*  PROT 7.2 6.9  --   ALBUMIN 4.2 4.1  --    No results for input(s): LIPASE, AMYLASE in the last 168 hours. No results for input(s): AMMONIA in the last 168 hours. Coagulation Profile: No results for input(s): INR, PROTIME in the last 168 hours. Cardiac Enzymes: No results for input(s): CKTOTAL, CKMB, CKMBINDEX, TROPONINI in the last 168 hours. BNP (last 3 results) No results for input(s): PROBNP in the last 8760 hours. HbA1C: Recent Labs    02/11/19 0436  HGBA1C 10.9*   CBG: Recent Labs  Lab 02/12/19 1731 02/12/19 1807 02/12/19 2216 02/13/19 0811 02/13/19 1156  GLUCAP 66* 114* 141* 237* 199*   Lipid Profile: No results for input(s): CHOL, HDL, LDLCALC, TRIG, CHOLHDL, LDLDIRECT in the last 72 hours. Thyroid Function Tests: No results for input(s): TSH, T4TOTAL, FREET4, T3FREE, THYROIDAB in the last 72 hours. Anemia Panel: No results for input(s): VITAMINB12, FOLATE, FERRITIN, TIBC, IRON, RETICCTPCT in the last 72 hours. Sepsis Labs: No results for input(s): PROCALCITON, LATICACIDVEN in the last 168 hours.  Recent Results (from the past 240 hour(s))  SARS CORONAVIRUS 2 (TAT 6-24 HRS) Nasopharyngeal Nasopharyngeal Swab     Status: None   Collection Time: 02/10/19  6:33 PM   Specimen: Nasopharyngeal Swab  Result Value Ref Range Status   SARS Coronavirus 2 NEGATIVE NEGATIVE Final    Comment: (NOTE) SARS-CoV-2 target nucleic acids are NOT DETECTED. The SARS-CoV-2 RNA is generally detectable in upper and lower respiratory specimens during the acute phase of infection. Negative results do not  preclude SARS-CoV-2 infection, do not rule out co-infections with other pathogens, and should not be used as the sole basis for treatment or other patient management decisions. Negative results must be combined with clinical observations, patient history, and epidemiological information. The expected result is Negative. Fact Sheet for Patients: HairSlick.nohttps://www.fda.gov/media/138098/download Fact Sheet for Healthcare Providers: quierodirigir.comhttps://www.fda.gov/media/138095/download This test is not yet approved or cleared by the [place] FDA and  has been authorized for detection and/or diagnosis of SARS-CoV-2 by FDA under an Emergency Use Authorization (EUA). This EUA will remain  in effect (meaning this test can be used)  for the duration of the COVID-19 declaration under Section 56 4(b)(1) of the Act, 21 U.S.C. section 360bbb-3(b)(1), unless the authorization is terminated or revoked sooner. Performed at [facility], [place] [address], [place], [place] [address]   Culture, blood (routine x 2)     Status: None (Preliminary result)   Collection Time: 02/10/19  9:38 PM   Specimen: BLOOD RIGHT HAND  Result Value Ref Range Status   Specimen Description   Final    BLOOD RIGHT HAND Performed at [facility] [address], [place], [place] [address]    Special Requests   Final    BOTTLES DRAWN AEROBIC AND ANAEROBIC Blood Culture adequate volume Performed at [facility] [address], [place], [place] [address]    Culture   Final    NO GROWTH 3 DAYS Performed at [facility], [place] [address], [place], [place] [address]    Report Status PENDING  Incomplete  Culture, blood (routine x 2)     Status: None (Preliminary result)   Collection Time: 02/10/19  9:43 PM   Specimen: BLOOD  Result Value Ref Range Status   Specimen Description   Final    BLOOD BLOOD RIGHT FOREARM Performed at [facility] [address],  [place], [place] [address]    Special Requests   Final    BOTTLES DRAWN AEROBIC AND ANAEROBIC Blood Culture results may not be optimal due to an inadequate volume of blood received in culture bottles Performed at [facility] [address] [place], [place] [address]    Culture   Final    NO GROWTH 3 DAYS Performed at [facility], [place] [address], [place], [place] [address]    Report Status PENDING  Incomplete  MRSA PCR Screening     Status: None   Collection Time: 02/12/19  5:38 PM   Specimen: Nasal Mucosa; Nasopharyngeal  Result Value Ref Range Status   MRSA by PCR NEGATIVE NEGATIVE Final    Comment:        The GeneXpert MRSA Assay (FDA approved for NASAL specimens only), is one component of a comprehensive MRSA colonization surveillance program. It is not intended to diagnose MRSA infection nor to guide or monitor treatment for MRSA infections. Performed at [facility], [place] [address], [place], [place] [address]          Radiology Studies: No results found.      Scheduled Meds: . Chlorhexidine Gluconate Cloth  6 each Topical Daily  . enoxaparin (LOVENOX) injection  40 mg Subcutaneous Q24H  . insulin aspart  0-5 Units Subcutaneous QHS  . insulin aspart  0-9 Units Subcutaneous TID WC  . insulin aspart  4 Units Subcutaneous TID WC  . insulin glargine  25 Units Subcutaneous Daily  . pantoprazole (PROTONIX) IV  40 mg Intravenous Q24H   Continuous Infusions:    LOS: 2 days     Cordelia Poche, MD [facility] 02/13/2019, 2:49 PM  If 7PM-7AM, please contact night-coverage [email or web address]

## 2019-02-14 ENCOUNTER — Inpatient Hospital Stay (HOSPITAL_COMMUNITY): Payer: Medicaid Other

## 2019-02-14 DIAGNOSIS — R17 Unspecified jaundice: Secondary | ICD-10-CM

## 2019-02-14 LAB — COMPREHENSIVE METABOLIC PANEL
ALT: 17 U/L (ref 0–44)
AST: 14 U/L — ABNORMAL LOW (ref 15–41)
Albumin: 3.7 g/dL (ref 3.5–5.0)
Alkaline Phosphatase: 46 U/L (ref 38–126)
Anion gap: 12 (ref 5–15)
BUN: 12 mg/dL (ref 6–20)
CO2: 23 mmol/L (ref 22–32)
Calcium: 8.9 mg/dL (ref 8.9–10.3)
Chloride: 101 mmol/L (ref 98–111)
Creatinine, Ser: 0.59 mg/dL — ABNORMAL LOW (ref 0.61–1.24)
GFR calc Af Amer: >60 mL/min (ref >60)
GFR calc non Af Amer: >60 mL/min (ref >60)
Glucose, Bld: 186 mg/dL — ABNORMAL HIGH (ref 70–99)
Potassium: 3.4 mmol/L — ABNORMAL LOW (ref 3.5–5.1)
Sodium: 136 mmol/L (ref 135–145)
Total Bilirubin: 2.1 mg/dL — ABNORMAL HIGH (ref 0.3–1.2)
Total Protein: 6.3 g/dL — ABNORMAL LOW (ref 6.5–8.1)

## 2019-02-14 LAB — CBC
HCT: 38.5 % — ABNORMAL LOW (ref 39.0–52.0)
Hemoglobin: 13.2 g/dL (ref 13.0–17.0)
MCH: 30.3 pg (ref 26.0–34.0)
MCHC: 34.3 g/dL (ref 30.0–36.0)
MCV: 88.5 fL (ref 80.0–100.0)
Platelets: 166 10*3/uL (ref 150–400)
RBC: 4.35 MIL/uL (ref 4.22–5.81)
RDW: 11.9 % (ref 11.5–15.5)
WBC: 9.7 10*3/uL (ref 4.0–10.5)
nRBC: 0 % (ref 0.0–0.2)

## 2019-02-14 LAB — HEPATITIS PANEL, ACUTE
HCV Ab: NONREACTIVE
Hep A IgM: NONREACTIVE
Hep B C IgM: NONREACTIVE
Hepatitis B Surface Ag: NONREACTIVE

## 2019-02-14 LAB — GLUCOSE, CAPILLARY
Glucose-Capillary: 137 mg/dL — ABNORMAL HIGH (ref 70–99)
Glucose-Capillary: 207 mg/dL — ABNORMAL HIGH (ref 70–99)
Glucose-Capillary: 344 mg/dL — ABNORMAL HIGH (ref 70–99)

## 2019-02-14 MED ORDER — TECHNETIUM TC 99M SULFUR COLLOID
2.1000 | Freq: Once | INTRAVENOUS | Status: AC | PRN
  Administered 2019-02-14: 09:00:00 2.1 via ORAL

## 2019-02-14 MED ORDER — INSULIN GLARGINE 100 UNIT/ML ~~LOC~~ SOLN: 25.0000 [IU] | Freq: Every day | SUBCUTANEOUS | 0 refills | Status: DC

## 2019-02-14 MED ORDER — INSULIN ASPART 100 UNIT/ML ~~LOC~~ SOLN: 4.0000 [IU] | Freq: Three times a day (TID) | SUBCUTANEOUS | 0 refills | Status: DC

## 2019-02-14 MED ORDER — ONDANSETRON HCL 4 MG PO TABS: 4.0000 mg | ORAL_TABLET | Freq: Every day | ORAL | 0 refills | Status: DC | PRN

## 2019-02-14 NOTE — Discharge Instructions (Signed)
Diabetic Ketoacidosis °Diabetic ketoacidosis is a serious complication of diabetes. This condition develops when there is not enough insulin in the body. Insulin is an hormone that regulates blood sugar levels in the body. Normally, insulin allows glucose to enter the cells in the body. The cells break down glucose for energy. Without enough insulin, the body cannot break down glucose, so it breaks down fats instead. This leads to high blood glucose levels in the body and the production of acids that are called ketones. Ketones are poisonous at high levels. °If diabetic ketoacidosis is not treated, it can cause severe dehydration and can lead to a coma or death. °What are the causes? °This condition develops when a lack of insulin causes the body to break down fats instead of glucose. This may be triggered by: °· Stress on the body. This stress is brought on by an illness. °· Infection. °· Medicines that raise blood glucose levels. °· Not taking diabetes medicine. °· New onset of type 1 diabetes mellitus. °What are the signs or symptoms? °Symptoms of this condition include: °· Fatigue. °· Weight loss. °· Excessive thirst. °· Light-headedness. °· Fruity or sweet-smelling breath. °· Excessive urination. °· Vision changes. °· Confusion or irritability. °· Nausea. °· Vomiting. °· Rapid breathing. °· Abdominal pain. °· Feeling flushed. °How is this diagnosed? °This condition is diagnosed based on your medical history, a physical exam, and blood tests. You may also have a urine test to check for ketones. °How is this treated? °This condition may be treated with: °· Fluid replacement. This may be done to correct dehydration. °· Insulin injections. These may be given through the skin or through an IV tube. °· Electrolyte replacement. Electrolytes are minerals in your blood. Electrolytes such as potassium and sodium may be given in pill form or through an IV tube. °· Antibiotic medicines. These may be prescribed if your  condition was caused by an infection. °Diabetic ketoacidosis is a serious medical condition. You may need emergency treatment in the hospital to monitor your condition. °Follow these instructions at home: °Eating and drinking °· Drink enough fluids to keep your urine clear or pale yellow. °· If you are not able to eat, drink clear fluids in small amounts as you are able. Clear fluids include water, ice chips, fruit juice with water added (diluted), and low-calorie sports drinks. You may also have sugar-free jello or popsicles. °· If you are able to eat, follow your usual diet and drink sugar-free liquids, such as water. °Medicines °· Take over-the-counter and prescription medicines only as told by your health care provider. °· Continue to take insulin and other diabetes medicines as told by your health care provider. °· If you were prescribed an antibiotic, take it as told by your health care provider. Do not stop taking the antibiotic even if you start to feel better. °General instructions ° °· Check your urine for ketones when you are ill and as told by your health care provider. °? If your blood glucose is 240 mg/dL (13.3 mmol/L) or higher, check your urine ketones every 4-6 hours. °· Check your blood glucose every day, as often as told by your health care provider. °? If your blood glucose is high, drink plenty of fluids. This helps to flush out ketones. °? If your blood glucose is above your target for 2 tests in a row, contact your health care provider. °· Carry a medical alert card or wear medical alert jewelry that says that you have diabetes. °· Rest   and exercise only as told by your health care provider. Do not exercise when your blood glucose is high and you have ketones in your urine. °· If you get sick, call your health care provider and begin treatment quickly. Your body often needs extra insulin to fight an illness. Check your blood glucose every 4-6 hours when you are sick. °· Keep all follow-up  visits as told by your health care provider. This is important. °Contact a health care provider if: °· Your blood glucose level is higher than 240 mg/dL (13.3 mmol/L) for 2 days in a row. °· You have moderate or large ketones in your urine. °· You have a fever. °· You cannot eat or drink without vomiting. °· You have been vomiting for more than 2 hours. °· You continue to have symptoms of diabetic ketoacidosis. °· You develop new symptoms. °Get help right away if: °· Your blood glucose monitor reads “high” even when you are taking insulin. °· You faint. °· You have chest pain. °· You have trouble breathing. °· You have sudden trouble speaking or swallowing. °· You have vomiting or diarrhea that gets worse after 3 hours. °· You are unable to stay awake. °· You have trouble thinking. °· You are severely dehydrated. Symptoms of severe dehydration include: °? Extreme thirst. °? Dry mouth. °? Rapid breathing. °These symptoms may represent a serious problem that is an emergency. Do not wait to see if the symptoms will go away. Get medical help right away. Call your local emergency services (911 in the [place] Do not drive yourself to the hospital. °Summary °· Diabetic ketoacidosis is a serious complication of diabetes. This condition develops when there is not enough insulin in the body. °· This condition is diagnosed based on your medical history, a physical exam, and blood tests. You may also have a urine test to check for ketones. °· Diabetic ketoacidosis is a serious medical condition. You may need emergency treatment in the hospital to monitor your condition. °· Contact your health care provider if your blood glucose is higher than 240 mg/dl for 2 days in a row or if you have moderate or large ketones in your urine. °This information is not intended to replace advice given to you by your health care provider. Make sure you discuss any questions you have with your health care provider. °Document Released: 04/14/2000  Document Revised: 06/02/2016 Document Reviewed: 05/22/2016 °Elsevier Patient Education © 2020 [employer] Inc[employer] ° °

## 2019-02-14 NOTE — Discharge Summary (Signed)
Physician Discharge Summary  Shawn Maldonado YPP:[id number] DOB: 1985/10/26 DOA: 02/10/2019  PCP: System, Pcp Not In  Admit date: 02/10/2019 Discharge date: 02/14/2019  Admitted From: Home Disposition: Home  Recommendations for Outpatient Follow-up:  1. Follow up with PCP in 1 week 2. Follow-up with GI regarding elevated bilirubin and abnormal liver ultrasound 3. Please obtain CMP/CBC in one week 4. Please follow up on the following pending results: Hepatitis panel  Home Health: None Equipment/Devices: None  Discharge Condition: Stable CODE STATUS: Full code Diet recommendation: Carb modified   Brief/Interim Summary:  Admission HPI written by Kerney Elbe, DO   Chief Complaint: Nausea and Vomiting   HPI: Shawn Maldonado is a 33 y.o. male with medical history significant for but not limited to diabetes mellitus type 1 with history of diabetic gastroparesis, history of transaminitis and elevated LFTs, hyperbilirubinemia, peripheral neuropathy, history of diabetic ketoacidosis and uncontrolled diabetes mellitus type 1 with last hemoglobin A1c of 12.6 scented to the ED with a chief complaint of nausea or vomiting that started after he missed his insulin doses.  He woke up this morning feeling unwell and given a dose of Lantus but unclear how much he took and started having nausea and vomiting.  They also started having some general abdominal cramping and pain around the diarrhea cough and congestion.  Not been around anybody has been sick.  Recently discharge from the hospital Sep 29, 2018 after admission for DKA and he was seen in the ED in August.  Denies any lightheadedness or dizziness or chest pain currently but continues to be severely nauseous and doubled over about to vomit.  He denies any other concerns or complaints at this time and [facility] was asked to meet this patient for diabetic ketoacidosis and type I diabetic.  ED Course: In the ED he had basic blood work done and was  given 2 L of normal saline boluses and started on maintenance IV fluid hydration and started on insulin drip.  I asked the ED provider to add on a UDS and his SARS-CoV-2 testing was pending   Hospital course:  DKA Uncontrolled diabetes mellitus, type 1 Diabetic peripheral neuropathy Patient managed on insulin drip with eventual closure of anion gap and improvement of acidosis. DKA precipitated by medication non-adherence. Patient's home insulin dose increased to Lantus 25 units daily. Can continue Novolog 4 units TID with meals.  Leukocytosis Likely related to DKA. No evidence of infection. Blood cultures obtained on admission are no growth to date. Resolved.  Intractable nausea and vomiting Secondary to DKA. History documented of gastroparesis. Gastric emptying study performed which was normal. Nausea/vomiting improved prior to discharge.  Hyperbilirubinemia Trended up. No associated elevation of AST/ALT. Hemoglobin appears stable. 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 RUQ ultrasound suggests possible liver pathology. Recommend GI follow-up Hepatitis panel obtained prior to discharge. Outpatient follow-up  Discharge Diagnoses:  Active Problems:   DKA (diabetic ketoacidoses) ([place])   DKA, type 1 ([facility])   Elevated bilirubin    Discharge Instructions   Allergies as of 02/14/2019   No Known Allergies     Medication List    STOP taking these medications   atorvastatin 20 MG tablet Commonly known as: LIPITOR   dicyclomine 20 MG tablet Commonly known as: BENTYL   lisinopril 5 MG tablet Commonly known as: ZESTRIL   metoCLOPramide 10 MG tablet Commonly known as: REGLAN   pantoprazole 40 MG tablet Commonly known as: PROTONIX  promethazine 25 MG tablet Commonly known as: PHENERGAN     TAKE these medications   insulin aspart 100 UNIT/ML injection Commonly known as: novoLOG Inject 4 Units into the skin 3 (three)  times daily with meals. Plus sliding scale as directed.   insulin glargine 100 UNIT/ML injection Commonly known as: LANTUS Inject 0.25 mLs (25 Units total) into the skin daily. What changed: how much to take   ondansetron 4 MG tablet Commonly known as: Zofran Take 1 tablet (4 mg total) by mouth daily as needed for nausea or vomiting.      Follow-up Information    [facility].   Why: Liver abnormality/Elevated bilirubin. Contact information: [address] [place] [address] [phone]          No Known Allergies  Consultations:  None   Procedures/Studies: Nm Gastric Emptying  Result Date: 02/14/2019 CLINICAL DATA:  Persistent nausea and vomiting.  Diabetes. EXAM: NUCLEAR MEDICINE GASTRIC EMPTYING SCAN TECHNIQUE: After oral ingestion of radiolabeled meal, sequential abdominal images were obtained for 4 hours. Percentage of activity emptying the stomach was calculated at 1 hour, 2 hour, 3 hour, and 4 hours. RADIOPHARMACEUTICALS:  2.1 mCi Tc-38m sulfur colloid in standardized meal COMPARISON:  None. FINDINGS: Expected location of the stomach in the left upper quadrant. Ingested meal empties the stomach gradually over the course of the study. 44% emptied at 1 hr ( normal >= 10%) 72% emptied at 2 hr ( normal >= 40%) 91% emptied at 3 hr ( normal >= 70%) > 91% emptied at 4 hr ( normal >= 90%) IMPRESSION: Normal gastric emptying study. Electronically Signed   By: Danae Orleans M.D.   On: 02/14/2019 13:42   Dg Chest Portable 1 View  Result Date: 02/10/2019 CLINICAL DATA:  Weakness, nausea and vomiting. EXAM: PORTABLE CHEST 1 VIEW COMPARISON:  July 27, 2018 FINDINGS: Cardiomediastinal silhouette is normal. Mediastinal contours appear intact. There is no evidence of focal airspace consolidation, pleural effusion or pneumothorax. Osseous structures are without acute abnormality. Soft tissues are grossly normal. IMPRESSION: No active disease.  Electronically Signed   By: Ted Mcalpine M.D.   On: 02/10/2019 15:55   US Abdomen Limited Ruq  Result Date: 02/14/2019 CLINICAL DATA:  Elevated bilirubin. EXAM: ULTRASOUND ABDOMEN LIMITED RIGHT UPPER QUADRANT COMPARISON:  None. FINDINGS: Gallbladder: No gallstones or wall thickening visualized. No sonographic Murphy sign noted by sonographer. Common bile duct: Diameter: 2 mm, normal. Liver: No focal lesion identified. There is slight accentuation of the perivascular structures which is nonspecific but can be seen with hepatocellular disease such as hepatitis. Portal vein is patent on color Doppler imaging with normal direction of blood flow towards the liver. Other: None. IMPRESSION: Slight accentuation of the perivascular structures of the liver which can be seen with hepatocellular disease such as hepatitis. Electronically Signed   By: Francene Boyers M.D.   On: 02/14/2019 14:34     Subjective: Feeling much better today.  Discharge Exam: Vitals:   02/14/19 1200 02/14/19 1454  BP:  124/84  Pulse:  73  Resp:  18  Temp: 98.2 F (36.8 C) 98.6 F (37 C)  SpO2:  99%   Vitals:   02/14/19 0800 02/14/19 0850 02/14/19 1200 02/14/19 1454  BP: (!) 144/89   124/84  Pulse: (!) 58 86  73  Resp:    18  Temp: 98.5 F (36.9 C)  98.2 F (36.8 C) 98.6 F (37 C)  TempSrc:   Oral Oral  SpO2:  99% 99%  99%  Weight:      Height:        General: Pt is alert, awake, not in acute distress Cardiovascular: RRR, S1/S2 +, no rubs, no gallops Respiratory: CTA bilaterally, no wheezing, no rhonchi Abdominal: Soft, NT, ND, bowel sounds + Extremities: no edema, no cyanosis    The results of significant diagnostics from this hospitalization (including imaging, microbiology, ancillary and laboratory) are listed below for reference.     Microbiology: Recent Results (from the past 240 hour(s))  SARS CORONAVIRUS 2 (TAT 6-24 HRS) Nasopharyngeal Nasopharyngeal Swab     Status: None   Collection Time:  02/10/19  6:33 PM   Specimen: Nasopharyngeal Swab  Result Value Ref Range Status   SARS Coronavirus 2 NEGATIVE NEGATIVE Final    Comment: (NOTE) SARS-CoV-2 target nucleic acids are NOT DETECTED. The SARS-CoV-2 RNA is generally detectable in upper and lower respiratory specimens during the acute phase of infection. Negative results do not preclude SARS-CoV-2 infection, do not rule out co-infections with other pathogens, and should not be used as the sole basis for treatment or other patient management decisions. Negative results must be combined with clinical observations, patient history, and epidemiological information. The expected result is Negative. Fact Sheet for Patients: HairSlick.no Fact Sheet for Healthcare Providers: quierodirigir.com This test is not yet approved or cleared by the [place] FDA and  has been authorized for detection and/or diagnosis of SARS-CoV-2 by FDA under an Emergency Use Authorization (EUA). This EUA will remain  in effect (meaning this test can be used) for the duration of the COVID-19 declaration under Section 56 4(b)(1) of the Act, 21 U.S.C. section 360bbb-3(b)(1), unless the authorization is terminated or revoked sooner. Performed at [facility], [address] [place], [place] [address]   Culture, blood (routine x 2)     Status: None (Preliminary result)   Collection Time: 02/10/19  9:38 PM   Specimen: BLOOD RIGHT HAND  Result Value Ref Range Status   Specimen Description   Final    BLOOD RIGHT HAND Performed at [facility], [address] [place], [place] [address]    Special Requests   Final    BOTTLES DRAWN AEROBIC AND ANAEROBIC Blood Culture adequate volume Performed at [facility], [address] [place], [place] [address]    Culture   Final    NO GROWTH 4 DAYS Performed at [facility], [address], [place],  [place] [address]    Report Status PENDING  Incomplete  Culture, blood (routine x 2)     Status: None (Preliminary result)   Collection Time: 02/10/19  9:43 PM   Specimen: BLOOD  Result Value Ref Range Status   Specimen Description   Final    BLOOD BLOOD RIGHT FOREARM Performed at [facility], [address], [place], [place] [address]    Special Requests   Final    BOTTLES DRAWN AEROBIC AND ANAEROBIC Blood Culture results may not be optimal due to an inadequate volume of blood received in culture bottles Performed at [facility], [address] [place], [place] [address]    Culture   Final    NO GROWTH 4 DAYS Performed at [facility], [address] [place], [place] [address]    Report Status PENDING  Incomplete  MRSA PCR Screening     Status: None   Collection Time: 02/12/19  5:38 PM   Specimen: Nasal  Mucosa; Nasopharyngeal  Result Value Ref Range Status   MRSA by PCR NEGATIVE NEGATIVE Final    Comment:        The GeneXpert MRSA Assay (FDA approved for NASAL specimens only), is one component of a comprehensive MRSA colonization surveillance program. It is not intended to diagnose MRSA infection nor to guide or monitor treatment for MRSA infections. Performed at [facility], [address], [place], [place] [address]      Labs: BNP (last 3 results) No results for input(s): BNP in the last 8760 hours. Basic Metabolic Panel: Recent Labs  Lab 02/10/19 1700  02/11/19 0436  02/12/19 0450 02/12/19 0839 02/12/19 1807 02/13/19 0201 02/14/19 0222  NA  --    < > 137   < > 138 138 137 136 136  K  --    < > 4.3   < > 3.1* 3.4* 2.8* 3.1* 3.4*  CL  --    < > 116*   < > 108 106 106 104 101  CO2  --    < > 14*   < > 21* 18* 21* 22 23  GLUCOSE  --    < > 145*   < > 170* 146* 118* 222* 186*  BUN  --    < > 9   < > CREATININE  --    < > 0.86   < > 0.67 0.62 0.61 0.64 0.59*  CALCIUM  --    < > 9.0   <  > 9.1 9.0 8.8* 8.6* 8.9  MG 2.0  --  1.9  --  1.9  --   --   --   --   PHOS 4.3  --  1.9*  --  2.0*  --   --   --   --    < > = values in this interval not displayed.   Liver Function Tests: Recent Labs  Lab 02/11/19 0436 02/12/19 0450 02/12/19 0839 02/13/19 1521 02/14/19 0222  AST 26 21  --  17 14*  ALT 22 20  --  21 17  ALKPHOS 60 59  --  53 46  BILITOT 1.6* 2.1* 1.8* 2.3* 2.1*  PROT 7.2 6.9  --  6.6 6.3*  ALBUMIN 4.2 4.1  --  3.8 3.7   No results for input(s): LIPASE, AMYLASE in the last 168 hours. No results for input(s): AMMONIA in the last 168 hours. CBC: Recent Labs  Lab 02/10/19 1331 02/11/19 0436 02/12/19 0450 02/14/19 0222  WBC 12.7* 17.4* 16.2* 9.7  NEUTROABS 10.6* 15.0* 12.9*  --   HGB 14.8 13.4 13.4 13.2  HCT 45.6 39.4 38.4* 38.5*  MCV 92.3 89.5 86.9 88.5  PLT 213 159 218 166   Cardiac Enzymes: No results for input(s): CKTOTAL, CKMB, CKMBINDEX, TROPONINI in the last 168 hours. BNP: Invalid input(s): POCBNP CBG: Recent Labs  Lab 02/13/19 0811 02/13/19 1156 02/13/19 1643 02/13/19 2222 02/14/19 0739  GLUCAP 237* 199* 83 137* 207*   D-Dimer No results for input(s): DDIMER in the last 72 hours. Hgb A1c No results for input(s): HGBA1C in the last 72 hours. Lipid Profile No results for input(s): CHOL, HDL, LDLCALC, TRIG, CHOLHDL, LDLDIRECT in the last 72 hours. Thyroid function studies No results for input(s): TSH, T4TOTAL, T3FREE, THYROIDAB in the last 72 hours.  Invalid input(s): FREET3 Anemia work up No results for input(s): VITAMINB12, FOLATE, FERRITIN, TIBC, IRON, RETICCTPCT in the last 72 hours. Urinalysis  Component Value Date/Time   COLORURINE STRAW (A) 02/10/2019 1536   APPEARANCEUR CLEAR 02/10/2019 1536   LABSPEC 1.016 02/10/2019 1536   PHURINE 5.0 02/10/2019 1536   GLUCOSEU >=500 (A) 02/10/2019 1536   HGBUR NEGATIVE 02/10/2019 1536   BILIRUBINUR NEGATIVE 02/10/2019 1536   KETONESUR 80 (A) 02/10/2019 1536   PROTEINUR NEGATIVE  02/10/2019 1536   UROBILINOGEN 0.2 09/24/2012 1037   NITRITE NEGATIVE 02/10/2019 1536   LEUKOCYTESUR NEGATIVE 02/10/2019 1536   Sepsis Labs Invalid input(s): PROCALCITONIN,  WBC,  LACTICIDVEN Microbiology Recent Results (from the past 240 hour(s))  SARS CORONAVIRUS 2 (TAT 6-24 HRS) Nasopharyngeal Nasopharyngeal Swab     Status: None   Collection Time: 02/10/19  6:33 PM   Specimen: Nasopharyngeal Swab  Result Value Ref Range Status   SARS Coronavirus 2 NEGATIVE NEGATIVE Final    Comment: (NOTE) SARS-CoV-2 target nucleic acids are NOT DETECTED. The SARS-CoV-2 RNA is generally detectable in upper and lower respiratory specimens during the acute phase of infection. Negative results do not preclude SARS-CoV-2 infection, do not rule out co-infections with other pathogens, and should not be used as the sole basis for treatment or other patient management decisions. Negative results must be combined with clinical observations, patient history, and epidemiological information. The expected result is Negative. Fact Sheet for Patients: HairSlick.no Fact Sheet for Healthcare Providers: quierodirigir.com This test is not yet approved or cleared by the [place] FDA and  has been authorized for detection and/or diagnosis of SARS-CoV-2 by FDA under an Emergency Use Authorization (EUA). This EUA will remain  in effect (meaning this test can be used) for the duration of the COVID-19 declaration under Section 56 4(b)(1) of the Act, 21 U.S.C. section 360bbb-3(b)(1), unless the authorization is terminated or revoked sooner. Performed at [facility], [address] [place], [place] [address]   Culture, blood (routine x 2)     Status: None (Preliminary result)   Collection Time: 02/10/19  9:38 PM   Specimen: BLOOD RIGHT HAND  Result Value Ref Range Status   Specimen Description   Final    BLOOD RIGHT HAND Performed at [facility], [address] [place], [place] [address]    Special Requests   Final    BOTTLES DRAWN AEROBIC AND ANAEROBIC Blood Culture adequate volume Performed at [facility], [address], [place], [place] [address]    Culture   Final    NO GROWTH 4 DAYS Performed at [facility], [address] [place], [place] [address]    Report Status PENDING  Incomplete  Culture, blood (routine x 2)     Status: None (Preliminary result)   Collection Time: 02/10/19  9:43 PM   Specimen: BLOOD  Result Value Ref Range Status   Specimen Description   Final    BLOOD BLOOD RIGHT FOREARM Performed at [facility], [address], [place], [place] [address]    Special Requests   Final    BOTTLES DRAWN AEROBIC AND ANAEROBIC Blood Culture results may not be optimal due to an inadequate volume of blood received in culture bottles Performed at [facility], [address], [place], [place] [address]    Culture   Final    NO GROWTH 4 DAYS Performed at [facility], [address], [place], [place] [address]    Report Status PENDING  Incomplete  MRSA PCR Screening     Status: None   Collection Time:  02/12/19  5:38 PM   Specimen: Nasal Mucosa; Nasopharyngeal  Result Value Ref Range Status   MRSA by PCR NEGATIVE NEGATIVE Final    Comment:        The GeneXpert MRSA Assay (FDA approved for NASAL specimens only), is one component of a comprehensive MRSA colonization surveillance program. It is not intended to diagnose MRSA infection nor to guide or monitor treatment for MRSA infections. Performed at [facility], [address] [place], [place] [id number]      Time coordinating discharge: 35 minutes  SIGNED:   Jacquelin Hawkingalph Shamiyah Ngu, MD [facility] 02/14/2019, 4:58 PM

## 2019-02-15 LAB — CULTURE, BLOOD (ROUTINE X 2)
Culture: NO GROWTH
Culture: NO GROWTH
Special Requests: ADEQUATE

## 2020-01-19 ENCOUNTER — Inpatient Hospital Stay (HOSPITAL_COMMUNITY)
Admission: EM | Admit: 2020-01-19 | Discharge: 2020-01-21 | DRG: 638 | Disposition: A | Discharge status: Home / Self Care | Payer: Medicaid Other | Attending: Student in an Organized Health Care Education/Training Program | Admitting: Student in an Organized Health Care Education/Training Program

## 2020-01-19 ENCOUNTER — Emergency Department (HOSPITAL_COMMUNITY): Payer: Medicaid Other

## 2020-01-19 ENCOUNTER — Other Ambulatory Visit: Payer: Self-pay

## 2020-01-19 ENCOUNTER — Encounter (HOSPITAL_COMMUNITY): Payer: Self-pay | Admitting: *Deleted

## 2020-01-19 DIAGNOSIS — Z823 Family history of stroke: Secondary | ICD-10-CM

## 2020-01-19 DIAGNOSIS — Z87891 Personal history of nicotine dependence: Secondary | ICD-10-CM

## 2020-01-19 DIAGNOSIS — E1010 Type 1 diabetes mellitus with ketoacidosis without coma: Principal | ICD-10-CM | POA: Diagnosis present

## 2020-01-19 DIAGNOSIS — Z794 Long term (current) use of insulin: Secondary | ICD-10-CM

## 2020-01-19 DIAGNOSIS — F1290 Cannabis use, unspecified, uncomplicated: Secondary | ICD-10-CM | POA: Diagnosis present

## 2020-01-19 DIAGNOSIS — E1110 Type 2 diabetes mellitus with ketoacidosis without coma: Secondary | ICD-10-CM | POA: Diagnosis present

## 2020-01-19 DIAGNOSIS — Z79899 Other long term (current) drug therapy: Secondary | ICD-10-CM

## 2020-01-19 DIAGNOSIS — Z20822 Contact with and (suspected) exposure to covid-19: Secondary | ICD-10-CM | POA: Diagnosis present

## 2020-01-19 DIAGNOSIS — Z8249 Family history of ischemic heart disease and other diseases of the circulatory system: Secondary | ICD-10-CM

## 2020-01-19 DIAGNOSIS — E119 Type 2 diabetes mellitus without complications: Secondary | ICD-10-CM

## 2020-01-19 DIAGNOSIS — A084 Viral intestinal infection, unspecified: Secondary | ICD-10-CM | POA: Diagnosis present

## 2020-01-19 DIAGNOSIS — R112 Nausea with vomiting, unspecified: Secondary | ICD-10-CM | POA: Diagnosis not present

## 2020-01-19 DIAGNOSIS — R17 Unspecified jaundice: Secondary | ICD-10-CM | POA: Diagnosis present

## 2020-01-19 DIAGNOSIS — E1065 Type 1 diabetes mellitus with hyperglycemia: Secondary | ICD-10-CM

## 2020-01-19 DIAGNOSIS — E109 Type 1 diabetes mellitus without complications: Secondary | ICD-10-CM

## 2020-01-19 LAB — CBG MONITORING, ED
Glucose-Capillary: 330 mg/dL — ABNORMAL HIGH (ref 70–99)
Glucose-Capillary: 296 mg/dL — ABNORMAL HIGH (ref 70–99)
Glucose-Capillary: 266 mg/dL — ABNORMAL HIGH (ref 70–99)

## 2020-01-19 LAB — COMPREHENSIVE METABOLIC PANEL
ALT: 25 U/L (ref 0–44)
AST: 32 U/L (ref 15–41)
Albumin: 4.4 g/dL (ref 3.5–5.0)
Alkaline Phosphatase: 58 U/L (ref 38–126)
Anion gap: 14 (ref 5–15)
BUN: 13 mg/dL (ref 6–20)
CO2: 22 mmol/L (ref 22–32)
Calcium: 9.6 mg/dL (ref 8.9–10.3)
Chloride: 103 mmol/L (ref 98–111)
Creatinine, Ser: 0.84 mg/dL (ref 0.61–1.24)
GFR calc Af Amer: >60 mL/min (ref >60)
GFR calc non Af Amer: >60 mL/min (ref >60)
Glucose, Bld: 325 mg/dL — ABNORMAL HIGH (ref 70–99)
Potassium: 4 mmol/L (ref 3.5–5.1)
Sodium: 139 mmol/L (ref 135–145)
Total Bilirubin: 1.4 mg/dL — ABNORMAL HIGH (ref 0.3–1.2)
Total Protein: 7.5 g/dL (ref 6.5–8.1)

## 2020-01-19 LAB — CBC
HCT: 43 % (ref 39.0–52.0)
Hemoglobin: 13.7 g/dL (ref 13.0–17.0)
MCH: 29.8 pg (ref 26.0–34.0)
MCHC: 31.9 g/dL (ref 30.0–36.0)
MCV: 93.5 fL (ref 80.0–100.0)
Platelets: 240 10*3/uL (ref 150–400)
RBC: 4.6 MIL/uL (ref 4.22–5.81)
RDW: 12.2 % (ref 11.5–15.5)
WBC: 16.2 10*3/uL — ABNORMAL HIGH (ref 4.0–10.5)
nRBC: 0 % (ref 0.0–0.2)

## 2020-01-19 LAB — URINALYSIS, ROUTINE W REFLEX MICROSCOPIC
Bacteria, UA: NONE SEEN
Bilirubin Urine: NEGATIVE
Glucose, UA: >=500 mg/dL — AB
Hgb urine dipstick: NEGATIVE
Ketones, ur: 80 mg/dL — AB
Leukocytes,Ua: NEGATIVE
Nitrite: NEGATIVE
Protein, ur: NEGATIVE mg/dL
Specific Gravity, Urine: 1.022 (ref 1.005–1.030)
pH: 5 (ref 5.0–8.0)

## 2020-01-19 LAB — BASIC METABOLIC PANEL
Anion gap: 14 (ref 5–15)
BUN: 14 mg/dL (ref 6–20)
CO2: 19 mmol/L — ABNORMAL LOW (ref 22–32)
Calcium: 8.8 mg/dL — ABNORMAL LOW (ref 8.9–10.3)
Chloride: 106 mmol/L (ref 98–111)
Creatinine, Ser: 0.88 mg/dL (ref 0.61–1.24)
GFR calc Af Amer: >60 mL/min (ref >60)
GFR calc non Af Amer: >60 mL/min (ref >60)
Glucose, Bld: 290 mg/dL — ABNORMAL HIGH (ref 70–99)
Potassium: 5 mmol/L (ref 3.5–5.1)
Sodium: 139 mmol/L (ref 135–145)

## 2020-01-19 LAB — LIPASE, BLOOD: Lipase: 23 U/L (ref 11–51)

## 2020-01-19 MED ORDER — SODIUM CHLORIDE 0.9 % IV BOLUS
1000.0000 mL | Freq: Once | INTRAVENOUS | Status: AC
  Administered 2020-01-19: 1000 mL via INTRAVENOUS

## 2020-01-19 MED ORDER — METOCLOPRAMIDE HCL 5 MG/ML IJ SOLN
10.0000 mg | Freq: Once | INTRAMUSCULAR | Status: AC
  Administered 2020-01-19: 10 mg via INTRAVENOUS
  Filled 2020-01-19: qty 2

## 2020-01-19 MED ORDER — DROPERIDOL 2.5 MG/ML IJ SOLN
1.2500 mg | Freq: Once | INTRAMUSCULAR | Status: AC
  Administered 2020-01-19: 1.25 mg via INTRAVENOUS
  Filled 2020-01-19: qty 2

## 2020-01-19 MED ORDER — INSULIN ASPART 100 UNIT/ML ~~LOC~~ SOLN: 4.0000 [IU] | SUBCUTANEOUS | Status: DC

## 2020-01-19 MED ORDER — SODIUM CHLORIDE 0.9 % IV BOLUS: 1000.0000 mL | Freq: Once | INTRAVENOUS | Status: DC

## 2020-01-19 MED ORDER — ONDANSETRON 4 MG PO TBDP
4.0000 mg | ORAL_TABLET | Freq: Once | ORAL | Status: AC | PRN
  Administered 2020-01-19: 4 mg via ORAL
  Filled 2020-01-19: qty 1

## 2020-01-19 MED ORDER — PROMETHAZINE HCL 25 MG/ML IJ SOLN
25.0000 mg | Freq: Once | INTRAMUSCULAR | Status: AC
  Administered 2020-01-19: 25 mg via INTRAVENOUS
  Filled 2020-01-19: qty 1

## 2020-01-19 MED ORDER — INSULIN ASPART 100 UNIT/ML ~~LOC~~ SOLN
0.0000 [IU] | SUBCUTANEOUS | Status: DC
  Administered 2020-01-20: 2 [IU] via SUBCUTANEOUS
  Administered 2020-01-20: 3 [IU] via SUBCUTANEOUS

## 2020-01-19 MED ORDER — ACETAMINOPHEN 325 MG PO TABS
650.0000 mg | ORAL_TABLET | Freq: Four times a day (QID) | ORAL | Status: DC | PRN
  Filled 2020-01-19: qty 2

## 2020-01-19 MED ORDER — ACETAMINOPHEN 650 MG RE SUPP: 650.0000 mg | Freq: Four times a day (QID) | RECTAL | Status: DC | PRN

## 2020-01-19 MED ORDER — SODIUM CHLORIDE 0.9 % IV SOLN: INTRAVENOUS | Status: AC

## 2020-01-19 MED ORDER — INSULIN ASPART 100 UNIT/ML ~~LOC~~ SOLN: 6.0000 [IU] | Freq: Three times a day (TID) | SUBCUTANEOUS | Status: DC

## 2020-01-19 MED ORDER — INSULIN ASPART 100 UNIT/ML ~~LOC~~ SOLN
3.0000 [IU] | SUBCUTANEOUS | Status: DC
  Administered 2020-01-20 (×3): 3 [IU] via SUBCUTANEOUS

## 2020-01-19 MED ORDER — ENOXAPARIN SODIUM 40 MG/0.4ML ~~LOC~~ SOLN
40.0000 mg | SUBCUTANEOUS | Status: DC
  Administered 2020-01-20: 40 mg via SUBCUTANEOUS
  Filled 2020-01-19 (×2): qty 0.4

## 2020-01-19 MED ORDER — PROMETHAZINE HCL 25 MG/ML IJ SOLN
25.0000 mg | INTRAMUSCULAR | Status: DC | PRN
  Administered 2020-01-20 (×2): 25 mg via INTRAVENOUS
  Filled 2020-01-19 (×2): qty 1

## 2020-01-19 MED ORDER — INSULIN ASPART 100 UNIT/ML ~~LOC~~ SOLN
10.0000 [IU] | Freq: Once | SUBCUTANEOUS | Status: AC
  Administered 2020-01-19: 10 [IU] via SUBCUTANEOUS

## 2020-01-19 NOTE — H&P (Addendum)
Date: 01/19/2020               Patient Name:  Shawn Maldonado MRN: [id number]  DOB: [birth year] Age / Sex: 34 y.o., male   PCP: Pcp, No         Medical Service: Internal Medicine Teaching Service         Attending Physician: Dr. Jacalyn Lefevre, MD    First Contact: Dr. Eliseo Gum Pager: [phone]  Second Contact: Dr. Dellia Cloud Pager: [phone]       After Hours (After 5p/  First Contact Pager: [phone]  weekends / holidays): Second Contact Pager: [phone]   Chief Complaint: Nausea, vomiting  History of Present Illness:  Shawn Maldonado is a 34 yo M w/ PMH of type 1 diabetes mellitus presenting to [facility] with nausea vomiting. Shawn Maldonado was examined and evaluated at bedside in the ED. He states he was in his usual state of health until around 7am this morning when he developed worsening nausea and vomiting without obvious inciting event. He mentions that he had traveled to [place] for vacation and had returned from his trip earlier the day prior. He had used marijuana and consumed alcohol during his trip and continued to use marijuana after his trip. He ate his usual meal for dinner and had no abdominal symptoms when he went to bed. In the morning, he woke up with significant nausea, NBNB emesis and chills. He mentions 1 episode of non-bloody diarrhea. He mentions having DKA in the past with similar symptoms. He is unable to described all the foods he ate during the weekend but denies eating any street food or expired foods.   On review of systems, he denies any chest pain, palpitations, dyspnea, dysuria. He denies any sick contact. Denies any headache, blurry vision or light-headedness.  Meds:  Lantus 25 units qhs Novolog 6-10 units TID with meal depending on sliding scale  No outpatient medications have been marked as taking for the 01/19/20 encounter [facility] Encounter).   Allergies: Allergies as of 01/19/2020   (No Known Allergies)   Past Medical History:    Diagnosis Date   Diabetes mellitus without complication (HCC)    Family History:  Denies any clinical relevant family history. Mentions that no one else in his family has diabetes and he is the 'first generation.'  Social History: Lives with his Endorse intermittent alcohol use on vacation and weekends. Mentions marijuana use. 1 time prior episode of cocaine. Denies any other illicit substance use.  Review of Systems: A complete ROS was negative except as per HPI.   Physical Exam: Blood pressure 133/86, pulse (!) 54, temperature 97.8 F (36.6 C), temperature source Oral, resp. rate 18, SpO2 100 %.  Gen: Ill-appearing, dry-heaving HEENT: NCAT head, dry mucous membrane, EOMI Neck: supple, ROM intact CV: RRR, S1, S2 normal, No rubs, no murmurs, no gallops Pulm: CTAB, No rales, no wheezes Abd: Soft, BS+, NTND, No rebound, no guarding Extm: ROM intact, Peripheral pulses intact, No peripheral edema Skin: Dry, Warm, poor turgor Neuro: AAOx3, Tremulous  EKG: Pending  CXR: Not performed  Assessment & Plan by Problem: Active Problems:   * No active hospital problems. *  Shawn Maldonado is a 34 yo M w/ PMH of type 1 diabetes mellitus admit for nausea vomiting likely due to E.coli enteritis vs viral gastroenteritis vs cannabis hyperemesis  Intractable nausea, vomiting 2/2 viral gastroenteritis vs cannabinoid hyperemesis syndrome vs E.coli enteritis Present w/ intractable nausea, vomiting after recent travel from [place]. Travel history  concerning for traveler's diarrhea with elevated white count at 16.2. Also may have had exposure to viral GI pathogens during travel. Onset of symptoms not consistent w/ food poisoning. Does endorse cannabinoid use which may also cause hyperemesis syndrome. Currently vitals are stable and Mr.Genao express symptomatic improvement with supportive care. Will continue with supportive care and advance diet as tolerated. - NS bolus + infusion - Phenergan PRN  for nausea - Clear diet, advance as tolerated  Type 1 Diabetes Mellitus Symptomatic hyperglycemia On lantus 24 units qhs, 6-10 units TID qc based on sliding scale. Mentions prior hx of DKA. Elevated blood glucose at 325 but no anion gap although he does have ketonuria. Co2 22. At risk for transition to DKA due as he has not taken his insulin due to his symptoms however asking for oral intake during evaluation. Will treat w/ subcutaneous insulin. - Novolog 3 units with meals + sliding scale - Glucose checks -Trend BMP  DVT prophx: Lovenox Diet: Clears Bowel: N/A Code: Full  Prior to Admission Living Arrangement: Home Anticipated Discharge Location: Home Barriers to Discharge: Medical treatment  Dispo: Admit patient to Observation with expected length of stay less than 2 midnights.  Signed: Theotis Barrio, MD 01/19/2020, 7:45 PM Pager: [phone] After 5pm on weekdays and 1pm on weekends: On Call Pager: [phone]

## 2020-01-19 NOTE — ED Provider Notes (Signed)
[facility] EMERGENCY DEPARTMENT Provider Note   CSN: [id number] Arrival date & time: 01/19/20  1045     History Chief Complaint  Patient presents with  . Emesis    Shawn Maldonado is a 34 y.o. male.  34 year old male with history of insulin dependent diabetes presents with complaint of nausea and vomiting onset this morning at 7AM. Reports history of DKA, is concerned his elevated blood sugar may be related to DKA. Denies abdominal pain, changes in bowel or bladder habits, fevers, chills, sick contacts. No other complaints or concerns.         Past Medical History:  Diagnosis Date  . Diabetes mellitus without complication [facility])     Patient Active Problem List   Diagnosis Date Noted  . Elevated bilirubin 02/14/2019  . DKA (diabetic ketoacidosis) (HCC) 07/27/2018  . Diabetic gastroparesis (HCC) 05/21/2017  . Leukocytosis 05/21/2017  . Transaminitis 05/21/2017  . Nausea & vomiting 05/17/2017  . AKI (acute kidney injury) (HCC) 05/17/2017  . Elevated LFTs 05/17/2017  . DKA, type 1 (HCC) 05/16/2017  . Hyperbilirubinemia 08/17/2015  . Intractable vomiting   . Type 1 diabetes mellitus with ketoacidosis without coma (HCC)   . Type I diabetes mellitus (HCC) 08/14/2015  . Peripheral neuropathy 08/14/2015  . Diabetic ketoacidosis without coma associated with type 1 diabetes mellitus (HCC)   . Opiate withdrawal (HCC)   . Acute upper respiratory infection 04/30/2015  . Hypokalemia 09/25/2012  . Diabetes mellitus (HCC) 09/24/2012  . DKA (diabetic ketoacidoses) (HCC) 09/24/2012  . Oral thrush 09/24/2012    Past Surgical History:  Procedure Laterality Date  . MOUTH SURGERY         Family History  Problem Relation Age of Onset  . Stroke Father   . CAD Other     Social History   Tobacco Use  . Smoking status: Former Smoker    Types: Cigarettes  . Smokeless tobacco: Never Used  Vaping Use  . Vaping Use: Never used  Substance Use Topics  .  Alcohol use: Yes    Comment: social  . Drug use: Yes    Types: Marijuana    Home Medications Prior to Admission medications   Medication Sig Start Date End Date Taking? Authorizing Provider  insulin aspart (NOVOLOG) 100 UNIT/ML injection Inject 4 Units into the skin 3 (three) times daily with meals. Plus sliding scale as directed. 02/14/19   Narda Bonds, MD  insulin glargine (LANTUS) 100 UNIT/ML injection Inject 0.25 mLs (25 Units total) into the skin daily. 02/14/19   Narda Bonds, MD  ondansetron (ZOFRAN) 4 MG tablet Take 1 tablet (4 mg total) by mouth daily as needed for nausea or vomiting. 02/14/19 02/14/20  Narda Bonds, MD    Allergies    Patient has no known allergies.  Review of Systems   Review of Systems  Constitutional: Negative for chills, diaphoresis and fever.  HENT: Negative for congestion and sore throat.   Respiratory: Negative for cough and shortness of breath.   Cardiovascular: Negative for chest pain.  Gastrointestinal: Positive for nausea and vomiting. Negative for abdominal pain, constipation and diarrhea.  Genitourinary: Negative for dysuria and frequency.  Musculoskeletal: Negative for arthralgias and myalgias.  Skin: Negative for rash and wound.  Allergic/Immunologic: Positive for immunocompromised state.  Neurological: Negative for weakness.  Psychiatric/Behavioral: Negative for confusion.  All other systems reviewed and are negative.   Physical Exam Updated Vital Signs BP 133/86   Pulse (!) 54  Temp 97.8 F (36.6 C) (Oral)   Resp 18   SpO2 100%   Physical Exam Vitals and nursing note reviewed.  Constitutional:      General: He is not in acute distress.    Appearance: He is well-developed. He is not toxic-appearing or diaphoretic.     Comments: Appears to feel unwell  HENT:     Head: Normocephalic and atraumatic.  Cardiovascular:     Rate and Rhythm: Normal rate and regular rhythm.     Pulses: Normal pulses.     Heart sounds:  Normal heart sounds.  Pulmonary:     Effort: Pulmonary effort is normal.     Breath sounds: Normal breath sounds.  Abdominal:     General: Abdomen is flat.     Palpations: Abdomen is soft.     Tenderness: There is no abdominal tenderness.  Skin:    General: Skin is warm and dry.     Findings: No erythema or rash.  Neurological:     Mental Status: He is alert and oriented to person, place, and time.  Psychiatric:        Behavior: Behavior normal.     ED Results / Procedures / Treatments   Labs (all labs ordered are listed, but only abnormal results are displayed) Labs Reviewed  COMPREHENSIVE METABOLIC PANEL - Abnormal; Notable for the following components:      Result Value   Glucose, Bld 325 (*)    Total Bilirubin 1.4 (*)    All other components within normal limits  CBC - Abnormal; Notable for the following components:   WBC 16.2 (*)    All other components within normal limits  CBG MONITORING, ED - Abnormal; Notable for the following components:   Glucose-Capillary 330 (*)    All other components within normal limits  CBG MONITORING, ED - Abnormal; Notable for the following components:   Glucose-Capillary 296 (*)    All other components within normal limits  SARS CORONAVIRUS 2 BY RT PCR (HOSPITAL ORDER, PERFORMED IN [facility])  LIPASE, BLOOD  URINALYSIS, ROUTINE W REFLEX MICROSCOPIC  BASIC METABOLIC PANEL  RAPID URINE DRUG SCREEN, HOSP PERFORMED    EKG None  Radiology No results found.  Procedures Procedures (including critical care time)  Medications Ordered in ED Medications  droperidol (INAPSINE) 2.5 MG/ML injection 1.25 mg (has no administration in time range)  insulin aspart (novoLOG) injection 10 Units (has no administration in time range)  ondansetron (ZOFRAN-ODT) disintegrating tablet 4 mg (4 mg Oral Given 01/19/20 1112)  sodium chloride 0.9 % bolus 1,000 mL (0 mLs Intravenous Stopped 01/19/20 1626)  metoCLOPramide (REGLAN) injection 10  mg (10 mg Intravenous Given 01/19/20 1510)  sodium chloride 0.9 % bolus 1,000 mL (0 mLs Intravenous Stopped 01/19/20 1830)  promethazine (PHENERGAN) injection 25 mg (25 mg Intravenous Given 01/19/20 1829)    ED Course  I have reviewed the triage vital signs and the nursing notes.  Pertinent labs & imaging results that were available during my care of the patient were reviewed by me and considered in my medical decision making (see chart for details).  Clinical Course as of Jan 18 1934  Mon Jan 19, 2020  8491 34 year old male with history of type 1 diabetes presents with nausea and vomiting onset 7 AM today.  Patient has a history of DKA, is concerned about this today as well.  Denies fevers, chills, abdominal pain, changes in bowel or bladder habits or sick contacts.  On exam, patient  appears to feel unwell, abdomen is soft and nontender, no CVA tenderness.  CMP with hyperglycemia with glucose of 325 otherwise bicarb and anion gap within normal meds.  CBC with elevated white count at 16,000, similar to prior.  Lipase within normal notes.  Patient was given Zofran in triage, continued to have vomiting.  Patient was given IV Reglan with a liter bolus, seemed to be improving, glucose on recheck of 296, second liter bolus ordered.    Is actively vomiting again, have ordered Phenergan and consult to hospitalist for admission for intractable vomiting.   [LM]  1858 Patient continues to vomit. BMP redrawn. Case discussed with Dr. Particia Nearing, ER attending, Inapsine order given.    [LM]  1935 Case discussed with Dr. Nedra Hai who will consult for admission.    [LM]    Clinical Course User Index [LM] Alden Hipp   MDM Rules/Calculators/A&P                          Final Clinical Impression(s) / ED Diagnoses Final diagnoses:  Intractable vomiting with nausea, unspecified vomiting type  Hyperglycemia due to type 1 diabetes mellitus [facility])    Rx / DC Orders ED Discharge Orders    None         Alden Hipp 01/19/20 Hershal Coria, MD 01/19/20 2003

## 2020-01-19 NOTE — ED Notes (Signed)
Pt reports feeling better

## 2020-01-19 NOTE — ED Notes (Signed)
Pt moved into rm 12 from hallway, report received from [place], [place].

## 2020-01-19 NOTE — ED Triage Notes (Signed)
Pt reports n/v since 0700. Hx of gastritis. cbg 330.

## 2020-01-20 DIAGNOSIS — F1290 Cannabis use, unspecified, uncomplicated: Secondary | ICD-10-CM | POA: Diagnosis present

## 2020-01-20 DIAGNOSIS — K529 Noninfective gastroenteritis and colitis, unspecified: Secondary | ICD-10-CM | POA: Diagnosis not present

## 2020-01-20 DIAGNOSIS — R17 Unspecified jaundice: Secondary | ICD-10-CM | POA: Diagnosis present

## 2020-01-20 DIAGNOSIS — Z79899 Other long term (current) drug therapy: Secondary | ICD-10-CM | POA: Diagnosis not present

## 2020-01-20 DIAGNOSIS — Z8249 Family history of ischemic heart disease and other diseases of the circulatory system: Secondary | ICD-10-CM | POA: Diagnosis not present

## 2020-01-20 DIAGNOSIS — Z87891 Personal history of nicotine dependence: Secondary | ICD-10-CM | POA: Diagnosis not present

## 2020-01-20 DIAGNOSIS — Z20822 Contact with and (suspected) exposure to covid-19: Secondary | ICD-10-CM | POA: Diagnosis present

## 2020-01-20 DIAGNOSIS — Z794 Long term (current) use of insulin: Secondary | ICD-10-CM | POA: Diagnosis not present

## 2020-01-20 DIAGNOSIS — Z823 Family history of stroke: Secondary | ICD-10-CM | POA: Diagnosis not present

## 2020-01-20 DIAGNOSIS — E1010 Type 1 diabetes mellitus with ketoacidosis without coma: Secondary | ICD-10-CM | POA: Diagnosis present

## 2020-01-20 DIAGNOSIS — A084 Viral intestinal infection, unspecified: Secondary | ICD-10-CM | POA: Diagnosis present

## 2020-01-20 DIAGNOSIS — R112 Nausea with vomiting, unspecified: Secondary | ICD-10-CM | POA: Diagnosis present

## 2020-01-20 LAB — HEMOGLOBIN A1C
Hgb A1c MFr Bld: 9.5 % — ABNORMAL HIGH (ref 4.8–5.6)
Mean Plasma Glucose: 225.95 mg/dL

## 2020-01-20 LAB — CBC
HCT: 39.2 % (ref 39.0–52.0)
Hemoglobin: 12.8 g/dL — ABNORMAL LOW (ref 13.0–17.0)
MCH: 29.9 pg (ref 26.0–34.0)
MCHC: 32.7 g/dL (ref 30.0–36.0)
MCV: 91.6 fL (ref 80.0–100.0)
Platelets: 220 10*3/uL (ref 150–400)
RBC: 4.28 MIL/uL (ref 4.22–5.81)
RDW: 12.5 % (ref 11.5–15.5)
WBC: 16.9 10*3/uL — ABNORMAL HIGH (ref 4.0–10.5)
nRBC: 0 % (ref 0.0–0.2)

## 2020-01-20 LAB — COMPREHENSIVE METABOLIC PANEL
ALT: 21 U/L (ref 0–44)
AST: 25 U/L (ref 15–41)
Albumin: 3.9 g/dL (ref 3.5–5.0)
Alkaline Phosphatase: 55 U/L (ref 38–126)
Anion gap: 11 (ref 5–15)
BUN: 11 mg/dL (ref 6–20)
CO2: 22 mmol/L (ref 22–32)
Calcium: 9.3 mg/dL (ref 8.9–10.3)
Chloride: 107 mmol/L (ref 98–111)
Creatinine, Ser: 0.74 mg/dL (ref 0.61–1.24)
GFR calc Af Amer: >60 mL/min (ref >60)
GFR calc non Af Amer: >60 mL/min (ref >60)
Glucose, Bld: 133 mg/dL — ABNORMAL HIGH (ref 70–99)
Potassium: 3.7 mmol/L (ref 3.5–5.1)
Sodium: 140 mmol/L (ref 135–145)
Total Bilirubin: 1.7 mg/dL — ABNORMAL HIGH (ref 0.3–1.2)
Total Protein: 6.7 g/dL (ref 6.5–8.1)

## 2020-01-20 LAB — CBG MONITORING, ED
Glucose-Capillary: 167 mg/dL — ABNORMAL HIGH (ref 70–99)
Glucose-Capillary: 116 mg/dL — ABNORMAL HIGH (ref 70–99)
Glucose-Capillary: 95 mg/dL (ref 70–99)
Glucose-Capillary: 169 mg/dL — ABNORMAL HIGH (ref 70–99)
Glucose-Capillary: 200 mg/dL — ABNORMAL HIGH (ref 70–99)

## 2020-01-20 LAB — SARS CORONAVIRUS 2 BY RT PCR (HOSPITAL ORDER, PERFORMED IN ~~LOC~~ HOSPITAL LAB): SARS Coronavirus 2: NEGATIVE

## 2020-01-20 LAB — GLUCOSE, CAPILLARY: Glucose-Capillary: 165 mg/dL — ABNORMAL HIGH (ref 70–99)

## 2020-01-20 LAB — HIV ANTIBODY (ROUTINE TESTING W REFLEX): HIV Screen 4th Generation wRfx: NONREACTIVE

## 2020-01-20 MED ORDER — INSULIN ASPART 100 UNIT/ML ~~LOC~~ SOLN
3.0000 [IU] | Freq: Three times a day (TID) | SUBCUTANEOUS | Status: DC
  Administered 2020-01-20 – 2020-01-21 (×2): 3 [IU] via SUBCUTANEOUS

## 2020-01-20 MED ORDER — INSULIN GLARGINE 100 UNIT/ML ~~LOC~~ SOLN
24.0000 [IU] | Freq: Every day | SUBCUTANEOUS | Status: DC
  Administered 2020-01-20: 24 [IU] via SUBCUTANEOUS
  Filled 2020-01-20 (×3): qty 0.24

## 2020-01-20 MED ORDER — METOCLOPRAMIDE HCL 5 MG/ML IJ SOLN: 10.0000 mg | Freq: Two times a day (BID) | INTRAMUSCULAR | Status: DC

## 2020-01-20 MED ORDER — ONDANSETRON HCL 4 MG/2ML IJ SOLN
4.0000 mg | Freq: Four times a day (QID) | INTRAMUSCULAR | Status: DC | PRN
  Administered 2020-01-20 – 2020-01-21 (×2): 4 mg via INTRAVENOUS
  Filled 2020-01-20 (×3): qty 2

## 2020-01-20 MED ORDER — METOCLOPRAMIDE HCL 5 MG/ML IJ SOLN
5.0000 mg | Freq: Two times a day (BID) | INTRAMUSCULAR | Status: DC
  Administered 2020-01-20: 5 mg via INTRAVENOUS
  Filled 2020-01-20: qty 2

## 2020-01-20 MED ORDER — INSULIN ASPART 100 UNIT/ML ~~LOC~~ SOLN
0.0000 [IU] | Freq: Three times a day (TID) | SUBCUTANEOUS | Status: DC
  Administered 2020-01-20 – 2020-01-21 (×2): 3 [IU] via SUBCUTANEOUS
  Administered 2020-01-21: 5 [IU] via SUBCUTANEOUS

## 2020-01-20 NOTE — ED Notes (Signed)
Patient provided with small amount of ice chips. Patient tried same and did not tolerate. Denies nausea at this time. Patient provided with moistened mouth swabs.

## 2020-01-20 NOTE — ED Notes (Signed)
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 Patient denies N/V at this time. Resting peacefully on stretcher with eyes closed, respirations even and nonlabored. NAD noted.

## 2020-01-20 NOTE — H&P (Deleted)
Date: 01/20/2020               Patient Name:  Shawn Maldonado MRN: [id number]  DOB: 07/08/1985 Age / Sex: 34 y.o., male   PCP: Pcp, No         Medical Service: Internal Medicine Teaching Service         Attending Physician: Dr. Oswaldo Done, Marquita Palms, *    First Contact: Dr. Eliseo Gum Pager: [phone]  Second Contact: Dr. Dellia Cloud Pager: [phone]       After Hours (After 5p/  First Contact Pager: [phone]  weekends / holidays): Second Contact Pager: [phone]   Chief Complaint: Nausea, vomiting  History of Present Illness:  Mr.Shawn Maldonado is a 34 yo M w/ PMH of type 1 diabetes mellitus presenting to [facility] with nausea vomiting. Mr.Shawn Maldonado was examined and evaluated at bedside in the ED. He states he was in his usual state of health until around 7am this morning when he developed worsening nausea and vomiting without obvious inciting event. He mentions that he had traveled to [place] for vacation and had returned from his trip earlier the day prior. He had used marijuana and consumed alcohol during his trip and continued to use marijuana after his trip. He ate his usual meal for dinner and had no abdominal symptoms when he went to bed. In the morning, he woke up with significant nausea, NBNB emesis and chills. He mentions 1 episode of non-bloody diarrhea. He mentions having DKA in the past with similar symptoms. He is unable to described all the foods he ate during the weekend but denies eating any street food or expired foods.   On review of systems, he denies any chest pain, palpitations, dyspnea, dysuria. He denies any sick contact. Denies any headache, blurry vision or light-headedness.  Meds:  Lantus 25 units qhs Novolog 6-10 units TID with meal depending on sliding scale  Current Meds  Medication Sig  . insulin aspart (NOVOLOG) 100 UNIT/ML injection Inject 4 Units into the skin 3 (three) times daily with meals. Plus sliding scale as directed.  . insulin  glargine (LANTUS) 100 UNIT/ML injection Inject 0.25 mLs (25 Units total) into the skin daily. (Patient taking differently: Inject 20 Units into the skin daily. )  . ondansetron (ZOFRAN) 4 MG tablet Take 1 tablet (4 mg total) by mouth daily as needed for nausea or vomiting.   Allergies: Allergies as of 01/19/2020  . (No Known Allergies)   Past Medical History:  Diagnosis Date  . Diabetes mellitus without complication (HCC)    Family History:  Denies any clinical relevant family history. Mentions that no one else in his family has diabetes and he is the 'first generation.'  Social History: Lives with his Endorse intermittent alcohol use on vacation and weekends. Mentions marijuana use. 1 time prior episode of cocaine. Denies any other illicit substance use.  Review of Systems: A complete ROS was negative except as per HPI.   Physical Exam: Blood pressure 132/80, pulse 92, temperature 98.3 F (36.8 C), temperature source Oral, resp. rate 16, SpO2 96 %.  Gen: Ill-appearing, dry-heaving HEENT: NCAT head, dry mucous membrane, EOMI Neck: supple, ROM intact CV: RRR, S1, S2 normal, No rubs, no murmurs, no gallops Pulm: CTAB, No rales, no wheezes Abd: Soft, BS+, NTND, No rebound, no guarding Extm: ROM intact, Peripheral pulses intact, No peripheral edema Skin: Dry, Warm, poor turgor Neuro: AAOx3, Tremulous  EKG: Pending  CXR: Not performed  Assessment & Plan by  Problem: Active Problems:   Intractable nausea and vomiting  Mr.Shawn Maldonado is a 34 yo M w/ PMH of type 1 diabetes mellitus admit for nausea vomiting likely due to E.coli enteritis vs viral gastroenteritis vs cannabis hyperemesis  Intractable nausea, vomiting 2/2 viral gastroenteritis vs cannabinoid hyperemesis syndrome vs E.coli enteritis Present w/ intractable nausea, vomiting after recent travel from [place]. Travel history concerning for traveler's diarrhea with elevated white count at 16.2. Also may have had exposure  to viral GI pathogens during travel. Onset of symptoms not consistent w/ food poisoning. Does endorse cannabinoid use which may also cause hyperemesis syndrome. Currently vitals are stable and Mr.Genao express symptomatic improvement with supportive care. Will continue with supportive care and advance diet as tolerated. - NS bolus + infusion - Phenergan PRN for nausea - Clear diet, advance as tolerated  Type 1 Diabetes Mellitus Symptomatic hyperglycemia On lantus 24 units qhs, 6-10 units TID qc based on sliding scale. Mentions prior hx of DKA. Elevated blood glucose at 325 but no anion gap although he does have ketonuria. Co2 22. At risk for transition to DKA due as he has not taken his insulin due to his symptoms however asking for oral intake during evaluation. Will treat w/ subcutaneous insulin. - Novolog 3 units q4hr + sliding scale - Glucose checks - Trend bmp  DVT prophx: Lovenox Diet: Clears Bowel: N/A Code: Full  Prior to Admission Living Arrangement: Home Anticipated Discharge Location: Home Barriers to Discharge: Medical treatment  Dispo: Admit patient to Observation with expected length of stay less than 2 midnights.  Signed: Theotis Barrio, MD 01/20/2020, 6:29 AM Pager: [phone] After 5pm on weekdays and 1pm on weekends: On Call Pager: [phone]

## 2020-01-20 NOTE — Progress Notes (Signed)
Inpatient Diabetes Program Recommendations  AACE/ADA: New Consensus Statement on Inpatient Glycemic Control (2015)  Target Ranges:  Prepandial:   less than 140 mg/dL      Peak postprandial:   less than 180 mg/dL (1-2 hours)      Critically ill patients:  140 - 180 mg/dL   Lab Results  Component Value Date   GLUCAP 169 (H) 01/20/2020   HGBA1C 9.5 (H) 01/20/2020    Review of Glycemic Control Results for MIKIE, MISNER (MRN [id number]) as of 01/20/2020 14:45  Ref. Range 01/19/2020 11:01 01/19/2020 17:05 01/19/2020 20:47 01/20/2020 01:34 01/20/2020 05:52 01/20/2020 08:21 01/20/2020 12:23  Glucose-Capillary Latest Ref Range: 70 - 99 mg/dL 209 (H) 470 (H) 962 (H) 167 (H) 116 (H) 95 169 (H)   Diabetes history: DM type 1 Outpatient Diabetes medications: Lantus 20 units Daily, Novolog 4 units tid Current orders for Inpatient glycemic control:  Novolog 0-15 units Q4 hours Novolog 4 units Q4 hours  Inpatient Diabetes Program Recommendations:    Risk for hypoglycemia if pt gets Novolog 3 units meal coverage when not eating at night time.  -  Change Novolog 3 units meal coverage to tid not Q4 hours.  Note: pt has history of Type 1 DM, may need addition of Basal insulin and reduction to Novolog Sensitive as pts with Type 1 DM can be sensitive to Novolog.  Thanks,  Christena Deem RN, MSN, BC-ADM Inpatient Diabetes Coordinator Team Pager [phone] (8a-5p)

## 2020-01-20 NOTE — Progress Notes (Signed)
   Subjective: HD 1 Overnight, patient admitted with intractable nausea and vomiting.  This morning, Shawn Maldonado was evaluated at bedside. He endorses improvement in symptoms but continues to have some nausea. He notes only have two soft bowel movements.  Objective:  Vital signs in last 24 hours: Vitals:   01/20/20 0614 01/20/20 1023 01/20/20 1100 01/20/20 1200  BP: 132/80 129/67 139/84 134/88  Pulse: 92 95 96 88  Resp: 16     Temp:      TempSrc:      SpO2: 96% 100% 100% 100%   CBC Latest Ref Rng & Units 01/20/2020 01/19/2020 02/14/2019  WBC 4.0 - 10.5 K/uL 16.9(H) 16.2(H) 9.7  Hemoglobin 13.0 - 17.0 g/dL 12.8(L) 13.7 13.2  Hematocrit 39 - 52 % 39.2 43.0 38.5(L)  Platelets 150 - 400 K/uL 220 240 166   BMP Latest Ref Rng & Units 01/20/2020 01/19/2020 01/19/2020  Glucose 70 - 99 mg/dL 263(Z) 858(I) 502(D)  BUN 6 - 20 mg/dL 11 14 13   Creatinine 0.61 - 1.24 mg/dL 7.41 2.87  Sodium 135 - 145 mmol/L 140 139 139  Potassium 3.5 - 5.1 mmol/L 3.7 5.0 4.0  Chloride 98 - 111 mmol/L 107 106 103  CO2 22 - 32 mmol/L 22 19(L) 22  Calcium 8.9 - 10.3 mg/dL 9.3 8.67) 9.6   Physical Exam  Constitutional: Appears well-developed and well-nourished. No distress.  HENT: Normocephalic and atraumatic, EOMI, conjunctiva normal, moist mucous membranes Cardiovascular: Mild tachycardia, regular rhythm, S1 and S2 present, no murmurs, rubs, gallops.  Distal pulses intact Respiratory: No respiratory distress, no accessory muscle use.  Effort is normal.  Lungs are clear to auscultation bilaterally. GI: Nondistended, soft, nontender to palpation, normal active bowel sounds Musculoskeletal: Normal bulk and tone.  No peripheral edema noted. Neurological: Is alert and oriented x4, no apparent focal deficits noted. Skin: Warm and dry.  No rash, erythema, lesions noted.  Assessment/Plan:  Active Problems:   Intractable nausea and vomiting Shawn Maldonado is a 34 year old male with PMHx of  type I diabetes with prior history of DKA presenting with intractable nausea/vomiting admitted for mild DKA.   Intractable nausea/vomiting:  Mild Diabetic Ketoacidosis:  Initially had slight anion gap with metabolic acidosis and ketonuria which improved with IV fluids and subcutaneous insulin. On repeat labs, his acidosis is resolved and anion gap is closed. Patient notes improvement in his symptoms but continues to have nausea and unable to tolerate oral intake. - IV zofran and phenergan prn for nausea/vomiting  - Lantus 24U qHS + Novolog 3u tid w/ meals + SSI - CBG monitoring - Trend BMP  - Advance diet as tolerated  Gastroenteritis: Patient with nausea/vomiting and few episodes of soft stools. Suspect viral gastroenteritis in setting of recent travel. Does have leukocytosis with mild hyperbilirubinemia. However, remainder of LFT's wnl and patient is afebrile.  - Continue supportive care    Diet: Clear, advance as tolerated  Fluids/Electrolytes: NS; monitor and replete electrolytes prn DVT Prophylaxis: Lovenox Code status:  FULL   Prior to Admission Living Arrangement: Home Anticipated Discharge Location: Home Barriers to Discharge: Continued medical management  Dispo: Anticipated discharge in approximately 0-1 day(s).   31, MD  [facility] PGY-2 01/20/2020, 1:57 PM Pager: [phone] After 5pm on weekdays and 1pm on weekends: On Call pager [phone]

## 2020-01-20 NOTE — ED Notes (Signed)
Notified admitting about pt improvement and the need to retest for covid as there was an unknown issue with getting results from test performed last night.

## 2020-01-21 LAB — COMPREHENSIVE METABOLIC PANEL
ALT: 23 U/L (ref 0–44)
AST: 21 U/L (ref 15–41)
Albumin: 4 g/dL (ref 3.5–5.0)
Alkaline Phosphatase: 55 U/L (ref 38–126)
Anion gap: 10 (ref 5–15)
BUN: 11 mg/dL (ref 6–20)
CO2: 27 mmol/L (ref 22–32)
Calcium: 9.1 mg/dL (ref 8.9–10.3)
Chloride: 101 mmol/L (ref 98–111)
Creatinine, Ser: 0.85 mg/dL (ref 0.61–1.24)
GFR calc Af Amer: >60 mL/min (ref >60)
GFR calc non Af Amer: >60 mL/min (ref >60)
Glucose, Bld: 209 mg/dL — ABNORMAL HIGH (ref 70–99)
Potassium: 3.6 mmol/L (ref 3.5–5.1)
Sodium: 138 mmol/L (ref 135–145)
Total Bilirubin: 2.4 mg/dL — ABNORMAL HIGH (ref 0.3–1.2)
Total Protein: 6.7 g/dL (ref 6.5–8.1)

## 2020-01-21 LAB — CBC WITH DIFFERENTIAL/PLATELET
Abs Immature Granulocytes: 0.07 10*3/uL (ref 0.00–0.07)
Basophils Absolute: 0.1 10*3/uL (ref 0.0–0.1)
Basophils Relative: 0 %
Eosinophils Absolute: 0 10*3/uL (ref 0.0–0.5)
Eosinophils Relative: 0 %
HCT: 38.6 % — ABNORMAL LOW (ref 39.0–52.0)
Hemoglobin: 13.1 g/dL (ref 13.0–17.0)
Immature Granulocytes: 1 %
Lymphocytes Relative: 16 %
Lymphs Abs: 2.3 10*3/uL (ref 0.7–4.0)
MCH: 30.5 pg (ref 26.0–34.0)
MCHC: 33.9 g/dL (ref 30.0–36.0)
MCV: 89.8 fL (ref 80.0–100.0)
Monocytes Absolute: 1.1 10*3/uL — ABNORMAL HIGH (ref 0.1–1.0)
Monocytes Relative: 8 %
Neutro Abs: 10.9 10*3/uL — ABNORMAL HIGH (ref 1.7–7.7)
Neutrophils Relative %: 75 %
Platelets: 232 10*3/uL (ref 150–400)
RBC: 4.3 MIL/uL (ref 4.22–5.81)
RDW: 12.3 % (ref 11.5–15.5)
WBC: 14.4 10*3/uL — ABNORMAL HIGH (ref 4.0–10.5)
nRBC: 0 % (ref 0.0–0.2)

## 2020-01-21 LAB — GLUCOSE, CAPILLARY
Glucose-Capillary: 232 mg/dL — ABNORMAL HIGH (ref 70–99)
Glucose-Capillary: 243 mg/dL — ABNORMAL HIGH (ref 70–99)
Glucose-Capillary: 192 mg/dL — ABNORMAL HIGH (ref 70–99)

## 2020-01-21 LAB — BILIRUBIN, DIRECT: Bilirubin, Direct: 0.3 mg/dL — ABNORMAL HIGH (ref 0.0–0.2)

## 2020-01-21 MED ORDER — INSULIN ASPART 100 UNIT/ML ~~LOC~~ SOLN: 4.0000 [IU] | Freq: Three times a day (TID) | SUBCUTANEOUS | 0 refills | Status: DC

## 2020-01-21 MED ORDER — ALUM & MAG HYDROXIDE-SIMETH 200-200-20 MG/5ML PO SUSP
30.0000 mL | Freq: Once | ORAL | Status: AC
  Administered 2020-01-21: 30 mL via ORAL
  Filled 2020-01-21: qty 30

## 2020-01-21 MED ORDER — INSULIN GLARGINE 100 UNIT/ML ~~LOC~~ SOLN: 25.0000 [IU] | Freq: Every day | SUBCUTANEOUS | 0 refills | Status: DC

## 2020-01-21 MED ORDER — ONDANSETRON HCL 4 MG PO TABS: 4.0000 mg | ORAL_TABLET | Freq: Four times a day (QID) | ORAL | 0 refills | Status: AC | PRN

## 2020-01-21 MED ORDER — METOCLOPRAMIDE HCL 5 MG/ML IJ SOLN
5.0000 mg | Freq: Three times a day (TID) | INTRAMUSCULAR | Status: DC
  Administered 2020-01-21: 5 mg via INTRAVENOUS
  Filled 2020-01-21: qty 2

## 2020-01-21 MED ORDER — INSULIN ASPART 100 UNIT/ML ~~LOC~~ SOLN
4.0000 [IU] | Freq: Three times a day (TID) | SUBCUTANEOUS | Status: DC
  Administered 2020-01-21: 4 [IU] via SUBCUTANEOUS

## 2020-01-21 NOTE — Hospital Course (Signed)
[facility]  Oct 10 AT 11 AM  Refill insulin to [facility]

## 2020-01-21 NOTE — Progress Notes (Signed)
° °  Subjective: HD 2 Overnight, no acute events reported.   This morning, Shawn Maldonado was evaluated at bedside. He is resting comfortably in bed. Although does have some ongoing nausea, no further episodes of emesis. He notes having two episodes of soft stools. One yesterday and one this morning. He is able to tolerate liquid diet at this time.   Objective:  Vital signs in last 24 hours: Vitals:   01/20/20 1400 01/20/20 1813 01/20/20 2344 01/21/20 0432  BP: 135/85 125/79 133/75 132/83  Pulse: 91 76 84 77  Resp:  16 16 16   Temp:  99.7 F (37.6 C) 99.5 F (37.5 C) 98.7 F (37.1 C)  TempSrc:  Oral Oral Oral  SpO2: 99% 100% 98% 99%   CBC Latest Ref Rng & Units 01/20/2020 01/19/2020 02/14/2019  WBC 4.0 - 10.5 K/uL 16.9(H) 16.2(H) 9.7  Hemoglobin 13.0 - 17.0 g/dL 12.8(L) 13.7 13.2  Hematocrit 39 - 52 % 39.2 43.0 38.5(L)  Platelets 150 - 400 K/uL 220 240 166   BMP Latest Ref Rng & Units 01/20/2020 01/19/2020 01/19/2020  Glucose 70 - 99 mg/dL 01/21/2020) 509(T) 267(T)  BUN 6 - 20 mg/dL 11 14 13   Creatinine 0.61 - 1.24 mg/dL 245(Y 0.99  Sodium 135 - 145 mmol/L 140 139 139  Potassium 3.5 - 5.1 mmol/L 3.7 5.0 4.0  Chloride 98 - 111 mmol/L 107 106 103  CO2 22 - 32 mmol/L 22 19(L) 22  Calcium 8.9 - 10.3 mg/dL 9.3 8.33) 9.6   Physical Exam  Constitutional: Appears well-developed and well-nourished. No distress.  Cardiovascular: Mild tachycardia, regular rhythm, S1 and S2 present, no murmurs, rubs, gallops.  Distal pulses intact Respiratory: No respiratory distress, no accessory muscle use.  Effort is normal.  Lungs are clear to auscultation bilaterally. GI: Nondistended, soft, nontender to palpation, normal active bowel sounds Musculoskeletal: Normal bulk and tone.  No peripheral edema noted. Neurological: Is alert and oriented x4, no apparent focal deficits noted. Skin: Warm and dry.  No rash, erythema, lesions noted.  Assessment/Plan:  Principal Problem:   DKA (diabetic  ketoacidoses) (HCC) Active Problems:   Diabetes mellitus (HCC)   Intractable nausea and vomiting Shawn Maldonado is a 34 year old male with PMHx of type I diabetes with prior history of DKA presenting with intractable nausea/vomiting admitted for mild DKA.   Intractable nausea/vomiting:  Mild Diabetic Ketoacidosis:  Initially had slight anion gap with metabolic acidosis and ketonuria which resolved with IV fluids and subcutaneous insulin. Overall has improvement in symptoms but continues to have mild nausea with "gas-like" symptoms. He is able to tolerate liquid diet this morning. Has had two episodes of soft stools.  Pending morning labs and ability to tolerate diet, will likely discharge home today for close follow up with his PCP and endocrinologist. He would benefit from stricter glucose controls.  - IV metoclopromide 5mg  tid - GI cocktail  - Lantus 24U qHS + Novolog 3u tid w/ meals + SSI - CBG monitoring - Trend BMP  - Advance diet as tolerated   Diet: Advance as tolerated  Fluids/Electrolytes: Monitor and replete prn DVT Prophylaxis: Lovenox Code status:  FULL   Prior to Admission Living Arrangement: Home Anticipated Discharge Location: Home Barriers to Discharge: Continued medical management  Dispo: Anticipated discharge in approximately 0-1 day(s).   Shawn Abrahams, MD  [facility] PGY-2 01/21/2020, 7:19 AM Pager: [phone] After 5pm on weekdays and 1pm on weekends: On Call pager [phone]

## 2020-01-21 NOTE — Progress Notes (Signed)
Nsg Discharge Note  Printed prescription given to patient and discharge education reviewed.  Admit Date:  01/19/2020 Discharge date: 01/21/2020   Loma Messing to be D/C'd Home per MD order.  AVS completed.  Copy for chart, and copy for patient signed, and dated. Patient/caregiver able to verbalize understanding.  Discharge Medication: Allergies as of 01/21/2020   No Known Allergies     Medication List    TAKE these medications   insulin aspart 100 UNIT/ML injection Commonly known as: novoLOG Inject 4 Units into the skin 3 (three) times daily with meals. Plus sliding scale as directed.   insulin glargine 100 UNIT/ML injection Commonly known as: LANTUS Inject 0.25 mLs (25 Units total) into the skin daily. What changed: how much to take   ondansetron 4 MG tablet Commonly known as: Zofran Take 1 tablet (4 mg total) by mouth every 6 (six) hours as needed for up to 5 days for nausea or vomiting. What changed: when to take this       Discharge Assessment: Vitals:   01/21/20 0432 01/21/20 1301  BP: 132/83 (!) 120/94  Pulse: 77 65  Resp: 16 18  Temp: 98.7 F (37.1 C) 98.9 F (37.2 C)  SpO2: 99% 97%   Skin clean, dry and intact without evidence of skin break down, no evidence of skin tears noted. IV catheter discontinued intact. Site without signs and symptoms of complications - no redness or edema noted at insertion site, patient denies c/o pain - only slight tenderness at site.  Dressing with slight pressure applied.  D/c Instructions-Education: Discharge instructions given to patient/family with verbalized understanding. D/c education completed with patient/family including follow up instructions, medication list, d/c activities limitations if indicated, with other d/c instructions as indicated by MD - patient able to verbalize understanding, all questions fully answered. Patient instructed to return to ED, call 911, or call MD for any changes in condition.  Patient  escorted via WC, and D/C home via private auto.  Boykin Nearing, RN 01/21/2020 3:34 PM

## 2020-01-22 ENCOUNTER — Other Ambulatory Visit: Payer: Self-pay | Admitting: Student

## 2020-01-22 NOTE — Discharge Summary (Addendum)
Name: Shawn Maldonado MRN: [id number] DOB: November 20, 1985 34 y.o. PCP: Pcp, No  Date of Admission: 01/19/2020 10:57 AM Date of Discharge: 01/21/2020 Attending Physician: Erlinda Hong, MD  Discharge Diagnosis: 1. Diabetic Ketoacidosis 2. Diabetes mellitus 3. Intractable nausea and vomiting  Discharge Medications: Allergies as of 01/21/2020   No Known Allergies      Medication List     TAKE these medications    insulin aspart 100 UNIT/ML injection Commonly known as: novoLOG Inject 4 Units into the skin 3 (three) times daily with meals. Plus sliding scale as directed.   insulin glargine 100 UNIT/ML injection Commonly known as: LANTUS Inject 0.25 mLs (25 Units total) into the skin daily. What changed: how much to take   ondansetron 4 MG tablet Commonly known as: Zofran Take 1 tablet (4 mg total) by mouth every 6 (six) hours as needed for up to 5 days for nausea or vomiting. What changed: when to take this        Disposition and follow-up:   Mr.Shawn Maldonado was discharged from [facility] in Stable condition.  At the hospital follow up visit please address:  1.  Diabetes: Patient with multiple admissions for DKA in the past year. Discharged on home insulin, will need evaluation for insulin adjustment/adherence.  2.  Labs / imaging needed at time of follow-up: none  3.  Pending labs/ test needing follow-up: none  Follow-up Appointments:      Hospital Course by problem list:  1. Mild Diabetic Ketoacidosis:  On lantus 24 units qhs, 6-10 units TID qc based on sliding scale. Mentions prior hx of DKA with 2 admissions in the past year. Elevated blood glucose at 325, slight anion gap of 14 with ketonuria. Metabolic acidosis and ketonuria which improved with IV fluids and subcutaneous insulin. On repeat labs, his acidosis is resolved and anion gap is closed at 10 at discharge. Discharged on home medications may need medication adjustment and  evaluation for adherence.  2. Intractable nausea, vomiting 2/2 viral gastroenteritis  Presented  intractable nausea, vomiting after recent travel from [place]. Travel history concerning for traveler's diarrhea with elevated white count at 16.2. Also may have had exposure to viral GI pathogens during travel.  He remained afebrile with stable vitals during admission. Treated with supportive care with  NS bolus and infusion and phenergan PRN. Patient improved with supportive care and was able to tolerate food on hospital day 2.    Discharge Vitals:   BP (!) 120/94 (BP Location: Left Arm)   Pulse 65   Temp 98.9 F (37.2 C) (Oral)   Resp 18   SpO2 97%   Pertinent Labs, Studies, and Procedures: CMP Latest Ref Rng & Units 01/21/2020 01/20/2020 01/19/2020  Glucose 70 - 99 mg/dL 149(F) 026(V) 785(Y)  BUN 6 - 20 mg/dL 11 11 14   Creatinine 0.61 - 1.24 mg/dL 8.50 2.77  Sodium 135 - 145 mmol/L 138 140 139  Potassium 3.5 - 5.1 mmol/L 3.6 3.7 5.0  Chloride 98 - 111 mmol/L 101 107 106  CO2 22 - 32 mmol/L 27 22 19(L)  Calcium 8.9 - 10.3 mg/dL 9.1 9.3 4.12)  Total Protein 6.5 - 8.1 g/dL 6.7 6.7 -  Total Bilirubin 0.3 - 1.2 mg/dL 2.4(H) 1.7(H) -  Alkaline Phos 38 - 126 U/L 55 55 -  AST 15 - 41 U/L 21 25 -  ALT 0 - 44 U/L 23 21 -      CLINICAL DATA:  Nausea and  vomiting   EXAM: PORTABLE CHEST 1 VIEW   COMPARISON:  02/10/2019   FINDINGS: The heart size and mediastinal contours are within normal limits. Both lungs are clear. The visualized skeletal structures are unremarkable.   IMPRESSION: No active disease.     Electronically Signed   By: Alcide Clever M.D.   On: 01/19/2020 20:12  Discharge Instructions: Discharge Instructions     Call MD for:  difficulty breathing, headache or visual disturbances   Complete by: As directed    Call MD for:  extreme fatigue   Complete by: As directed    Call MD for:  hives   Complete by: As directed    Call MD for:  persistant dizziness or  light-headedness   Complete by: As directed    Call MD for:  persistant nausea and vomiting   Complete by: As directed    Call MD for:  severe uncontrolled pain   Complete by: As directed    Call MD for:  temperature >100.4   Complete by: As directed    Diet Carb Modified   Complete by: As directed    Discharge instructions   Complete by: As directed    [name],  Thank you for allowing [place] to participate in your  care. You were admitted to the hospital with mild DKA (diabetic ketoacidosis). On discharge, please continue your home insulin regimen and follow up with your PCP for adjustment of your insulin.  Thank you!   Increase activity slowly   Complete by: As directed    No wound care   Complete by: As directed        Signed: Quincy Simmonds, MD 01/22/2020, 12:20 PM   Pager: [phone]

## 2020-01-22 NOTE — Progress Notes (Signed)
Opened in error

## 2020-02-15 ENCOUNTER — Other Ambulatory Visit: Payer: Self-pay | Admitting: Internal Medicine

## 2020-02-16 ENCOUNTER — Other Ambulatory Visit: Payer: Self-pay | Admitting: Internal Medicine

## 2020-05-21 ENCOUNTER — Other Ambulatory Visit: Payer: Medicaid Other

## 2020-05-21 ENCOUNTER — Other Ambulatory Visit: Payer: Self-pay

## 2020-05-21 DIAGNOSIS — Z20822 Contact with and (suspected) exposure to covid-19: Secondary | ICD-10-CM

## 2020-05-22 LAB — SARS-COV-2, NAA 2 DAY TAT

## 2020-05-22 LAB — NOVEL CORONAVIRUS, NAA: SARS-CoV-2, NAA: DETECTED — AB

## 2021-01-04 ENCOUNTER — Encounter (HOSPITAL_BASED_OUTPATIENT_CLINIC_OR_DEPARTMENT_OTHER): Payer: Self-pay | Admitting: Emergency Medicine

## 2021-01-04 ENCOUNTER — Other Ambulatory Visit: Payer: Self-pay

## 2021-01-04 ENCOUNTER — Emergency Department (HOSPITAL_BASED_OUTPATIENT_CLINIC_OR_DEPARTMENT_OTHER)
Admission: EM | Admit: 2021-01-04 | Discharge: 2021-01-04 | Disposition: A | Discharge status: Home / Self Care | Payer: Medicaid Other | Attending: Emergency Medicine | Admitting: Emergency Medicine

## 2021-01-04 DIAGNOSIS — E1010 Type 1 diabetes mellitus with ketoacidosis without coma: Secondary | ICD-10-CM | POA: Diagnosis not present

## 2021-01-04 DIAGNOSIS — Z794 Long term (current) use of insulin: Secondary | ICD-10-CM | POA: Insufficient documentation

## 2021-01-04 DIAGNOSIS — K0889 Other specified disorders of teeth and supporting structures: Secondary | ICD-10-CM | POA: Diagnosis present

## 2021-01-04 DIAGNOSIS — E1040 Type 1 diabetes mellitus with diabetic neuropathy, unspecified: Secondary | ICD-10-CM | POA: Insufficient documentation

## 2021-01-04 DIAGNOSIS — K047 Periapical abscess without sinus: Secondary | ICD-10-CM | POA: Diagnosis not present

## 2021-01-04 DIAGNOSIS — Z87891 Personal history of nicotine dependence: Secondary | ICD-10-CM | POA: Diagnosis not present

## 2021-01-04 MED ORDER — AMOXICILLIN 500 MG PO CAPS: 500.0000 mg | ORAL_CAPSULE | Freq: Three times a day (TID) | ORAL | 0 refills | Status: DC

## 2021-01-04 NOTE — ED Provider Notes (Signed)
[facility] EMERGENCY DEPT Provider Note   CSN: [id number] Arrival date & time: 01/04/21  1058     History Chief Complaint  Patient presents with   Dental Pain    Shawn Maldonado is a 35 y.o. male.  The history is provided by the patient.  Dental Pain Location:  Lower Lower teeth location:  24/LL central incisor and 25/RL central incisor Quality:  Throbbing Severity:  Moderate Onset quality:  Sudden Duration:  2 days Timing:  Constant Progression:  Unchanged Chronicity:  New Context: abscess and poor dentition   Worsened by:  Nothing Ineffective treatments:  NSAIDs (cold compresses) Associated symptoms: gum swelling   Associated symptoms: no facial swelling and no fever   Risk factors: diabetes, lack of dental care and smoking       Past Medical History:  Diagnosis Date   Diabetes mellitus without complication (HCC)     Patient Active Problem List   Diagnosis Date Noted   Intractable nausea and vomiting 01/19/2020   Elevated bilirubin 02/14/2019   DKA (diabetic ketoacidosis) (HCC) 07/27/2018   Diabetic gastroparesis (HCC) 05/21/2017   Leukocytosis 05/21/2017   Transaminitis 05/21/2017   Nausea & vomiting 05/17/2017   AKI (acute kidney injury) (HCC) 05/17/2017   Elevated LFTs 05/17/2017   DKA, type 1 (HCC) 05/16/2017   Hyperbilirubinemia 08/17/2015   Intractable vomiting    Type 1 diabetes mellitus with ketoacidosis without coma (HCC)    Type I diabetes mellitus (HCC) 08/14/2015   Peripheral neuropathy 08/14/2015   Diabetic ketoacidosis without coma associated with type 1 diabetes mellitus (HCC)    Opiate withdrawal (HCC)    Acute upper respiratory infection 04/30/2015   Hypokalemia 09/25/2012   Diabetes mellitus (HCC) 09/24/2012   DKA (diabetic ketoacidoses) 09/24/2012   Oral thrush 09/24/2012    Past Surgical History:  Procedure Laterality Date   MOUTH SURGERY         Family History  Problem Relation Age of Onset   Stroke  Father    CAD Other     Social History   Tobacco Use   Smoking status: Former    Types: Cigarettes   Smokeless tobacco: Never  Vaping Use   Vaping Use: Never used  Substance Use Topics   Alcohol use: Yes    Comment: social   Drug use: Yes    Types: Marijuana    Home Medications Prior to Admission medications   Medication Sig Start Date End Date Taking? Authorizing Provider  amoxicillin (AMOXIL) 500 MG capsule Take 1 capsule (500 mg total) by mouth 3 (three) times daily. 01/04/21  Yes Koleen Distance, MD  insulin aspart (NOVOLOG) 100 UNIT/ML injection Inject 4 Units into the skin 3 (three) times daily with meals. Plus sliding scale as directed. 01/21/20   Dellia Cloud, MD  insulin glargine (LANTUS) 100 UNIT/ML injection Inject 0.25 mLs (25 Units total) into the skin daily. 01/21/20   Dellia Cloud, MD    Allergies    Patient has no known allergies.  Review of Systems   Review of Systems  Constitutional:  Negative for chills and fever.  HENT:  Negative for ear pain, facial swelling and sore throat.   Eyes:  Negative for pain and visual disturbance.  Respiratory:  Negative for cough and shortness of breath.   Cardiovascular:  Negative for chest pain and palpitations.  Gastrointestinal:  Negative for abdominal pain and vomiting.  Genitourinary:  Negative for dysuria and hematuria.  Musculoskeletal:  Negative for arthralgias and back pain.  Skin:  Negative for color change and rash.  Neurological:  Negative for seizures and syncope.  All other systems reviewed and are negative.  Physical Exam Updated Vital Signs BP 125/83 (BP Location: Right Arm)   Pulse 88   Temp 98.3 F (36.8 C) (Oral)   Resp 16   Ht 5\' 10"  (1.778 m)   Wt 61.2 kg   SpO2 100%   BMI 19.37 kg/m   Physical Exam Vitals and nursing note reviewed.  Constitutional:      Appearance: Normal appearance.  HENT:     Head: Normocephalic and atraumatic.     Mouth/Throat:     Comments: Swelling and fluctuance  on the buccal surface of the gingiva in front of teeth numbers 24 and 25.  With gentle pressure, there is spontaneous drainage of purulent material.  Generalized poor dentition.  Normal jaw opening.  No facial swelling. Eyes:     Conjunctiva/sclera: Conjunctivae normal.  Pulmonary:     Effort: Pulmonary effort is normal. No respiratory distress.  Musculoskeletal:        General: No deformity. Normal range of motion.     Cervical back: Normal range of motion.  Skin:    General: Skin is warm and dry.  Neurological:     General: No focal deficit present.     Mental Status: He is alert and oriented to person, place, and time. Mental status is at baseline.  Psychiatric:        Mood and Affect: Mood normal.    ED Results / Procedures / Treatments   Labs (all labs ordered are listed, but only abnormal results are displayed) Labs Reviewed - No data to display  EKG None  Radiology No results found.  Procedures Procedures   Medications Ordered in ED Medications - No data to display  ED Course  I have reviewed the triage vital signs and the nursing notes.  Pertinent labs & imaging results that were available during my care of the patient were reviewed by me and considered in my medical decision making (see chart for details).    MDM Rules/Calculators/A&P                           presents with a dental abscess.  It is draining spontaneously, and it does not require I&D.  He was advised to use hydrogen peroxide and water in a one-to-one mixture.  He was given antibiotics and referred to dentistry. Final Clinical Impression(s) / ED Diagnoses Final diagnoses:  Dental abscess    Rx / DC Orders ED Discharge Orders          Ordered    amoxicillin (AMOXIL) 500 MG capsule  3 times daily        01/04/21 1155             03/06/21, MD 01/04/21 1200

## 2021-01-04 NOTE — Discharge Instructions (Addendum)
Rinse your mouth 3 times per day with a 1:1 mixture of hydrogen peroxide and water.

## 2021-01-04 NOTE — ED Triage Notes (Signed)
Pt to ER with dental pain that started yesterday.  Pt reports swelling to gums around tooth.  Noted swelling to lower right gumline.

## 2021-07-31 ENCOUNTER — Encounter (HOSPITAL_COMMUNITY): Payer: Self-pay

## 2021-07-31 ENCOUNTER — Emergency Department (HOSPITAL_COMMUNITY)
Admission: EM | Admit: 2021-07-31 | Discharge: 2021-08-01 | Disposition: A | Discharge status: Home / Self Care | Payer: Medicaid Other | Attending: Emergency Medicine | Admitting: Emergency Medicine

## 2021-07-31 ENCOUNTER — Emergency Department (HOSPITAL_COMMUNITY): Payer: Medicaid Other

## 2021-07-31 ENCOUNTER — Other Ambulatory Visit: Payer: Self-pay

## 2021-07-31 DIAGNOSIS — R1084 Generalized abdominal pain: Secondary | ICD-10-CM | POA: Diagnosis not present

## 2021-07-31 DIAGNOSIS — R1010 Upper abdominal pain, unspecified: Secondary | ICD-10-CM | POA: Diagnosis present

## 2021-07-31 DIAGNOSIS — E119 Type 2 diabetes mellitus without complications: Secondary | ICD-10-CM | POA: Insufficient documentation

## 2021-07-31 DIAGNOSIS — Z794 Long term (current) use of insulin: Secondary | ICD-10-CM | POA: Diagnosis not present

## 2021-07-31 DIAGNOSIS — R112 Nausea with vomiting, unspecified: Secondary | ICD-10-CM | POA: Diagnosis not present

## 2021-07-31 LAB — CBG MONITORING, ED
Glucose-Capillary: 129 mg/dL — ABNORMAL HIGH (ref 70–99)
Glucose-Capillary: 189 mg/dL — ABNORMAL HIGH (ref 70–99)

## 2021-07-31 LAB — COMPREHENSIVE METABOLIC PANEL
ALT: 43 U/L (ref 0–44)
AST: 49 U/L — ABNORMAL HIGH (ref 15–41)
Albumin: 5.1 g/dL — ABNORMAL HIGH (ref 3.5–5.0)
Alkaline Phosphatase: 73 U/L (ref 38–126)
Anion gap: 14 (ref 5–15)
BUN: 24 mg/dL — ABNORMAL HIGH (ref 6–20)
CO2: 24 mmol/L (ref 22–32)
Calcium: 10.2 mg/dL (ref 8.9–10.3)
Chloride: 100 mmol/L (ref 98–111)
Creatinine, Ser: 0.86 mg/dL (ref 0.61–1.24)
GFR, Estimated: >60 mL/min (ref >60)
Glucose, Bld: 157 mg/dL — ABNORMAL HIGH (ref 70–99)
Potassium: 3.6 mmol/L (ref 3.5–5.1)
Sodium: 138 mmol/L (ref 135–145)
Total Bilirubin: 1.1 mg/dL (ref 0.3–1.2)
Total Protein: 8.8 g/dL — ABNORMAL HIGH (ref 6.5–8.1)

## 2021-07-31 LAB — CBC
HCT: 43.8 % (ref 39.0–52.0)
Hemoglobin: 15.8 g/dL (ref 13.0–17.0)
MCH: 31.7 pg (ref 26.0–34.0)
MCHC: 36.1 g/dL — ABNORMAL HIGH (ref 30.0–36.0)
MCV: 87.8 fL (ref 80.0–100.0)
Platelets: 265 10*3/uL (ref 150–400)
RBC: 4.99 MIL/uL (ref 4.22–5.81)
RDW: 12.1 % (ref 11.5–15.5)
WBC: 16.3 10*3/uL — ABNORMAL HIGH (ref 4.0–10.5)
nRBC: 0 % (ref 0.0–0.2)

## 2021-07-31 LAB — LIPASE, BLOOD: Lipase: 21 U/L (ref 11–51)

## 2021-07-31 LAB — ETHANOL: Alcohol, Ethyl (B): <10 mg/dL (ref <10)

## 2021-07-31 MED ORDER — IOHEXOL 300 MG/ML  SOLN
100.0000 mL | Freq: Once | INTRAMUSCULAR | Status: AC | PRN
  Administered 2021-07-31: 100 mL via INTRAVENOUS

## 2021-07-31 MED ORDER — DROPERIDOL 2.5 MG/ML IJ SOLN
2.5000 mg | Freq: Once | INTRAMUSCULAR | Status: AC
  Administered 2021-07-31: 2.5 mg via INTRAVENOUS
  Filled 2021-07-31: qty 2

## 2021-07-31 MED ORDER — PROCHLORPERAZINE MALEATE 10 MG PO TABS: 10.0000 mg | ORAL_TABLET | Freq: Four times a day (QID) | ORAL | 0 refills | Status: DC | PRN

## 2021-07-31 NOTE — Discharge Instructions (Signed)
Start with a clear liquid diet then gradually advance to regular foods after 2 or 3 days.  Call your doctor for a follow-up appointment to be seen as soon as possible.  If you do not have a regular doctor, use the resource guide to help you find one. ?

## 2021-07-31 NOTE — ED Provider Notes (Signed)
?[facility]-EMERGENCY DEPT ?Provider Note ? ? ?CSN: [id number] ?Arrival date & time: 07/31/21  1909 ? ?  ? ?History ? ?Chief Complaint  ?Patient presents with  ? Abdominal Pain  ? ? ?Shawn Maldonado is a 36 y.o. male. ? ?HPI ?He presents for evaluation of vomiting with hematemesis for 2 days.  He also complains of upper abdominal pain.  He thinks that he has DKA.  Is not clear if he is taking his medicines.  He is a poor historian. ?  ? ?Home Medications ?Prior to Admission medications   ?Medication Sig Start Date End Date Taking? Authorizing Provider  ?prochlorperazine (COMPAZINE) 10 MG tablet Take 1 tablet (10 mg total) by mouth every 6 (six) hours as needed for nausea or vomiting. 07/31/21  Yes Mancel Bale, MD  ?amoxicillin (AMOXIL) 500 MG capsule Take 1 capsule (500 mg total) by mouth 3 (three) times daily. 01/04/21   Koleen Distance, MD  ?insulin aspart (NOVOLOG) 100 UNIT/ML injection Inject 4 Units into the skin 3 (three) times daily with meals. Plus sliding scale as directed. 01/21/20   Dellia Cloud, MD  ?insulin glargine (LANTUS) 100 UNIT/ML injection Inject 0.25 mLs (25 Units total) into the skin daily. 01/21/20   Dellia Cloud, MD  ?   ? ?Allergies    ?Patient has no known allergies.   ? ?Review of Systems   ?Review of Systems ? ?Physical Exam ?Updated Vital Signs ?BP 139/79   Pulse 74   Temp 99.1 ?F (37.3 ?C) (Oral)   Resp 17   SpO2 99%  ?Physical Exam ?Vitals and nursing note reviewed.  ?Constitutional:   ?   General: He is not in acute distress. ?   Appearance: He is well-developed. He is ill-appearing. He is not toxic-appearing or diaphoretic.  ?HENT:  ?   Head: Normocephalic and atraumatic.  ?   Right Ear: External ear normal.  ?   Left Ear: External ear normal.  ?   Mouth/Throat:  ?   Mouth: Mucous membranes are moist.  ?Eyes:  ?   Conjunctiva/sclera: Conjunctivae normal.  ?   Pupils: Pupils are equal, round, and reactive to light.  ?Neck:  ?   Trachea: Phonation normal.   ?Cardiovascular:  ?   Rate and Rhythm: Normal rate and regular rhythm.  ?   Heart sounds: Normal heart sounds.  ?Pulmonary:  ?   Effort: Pulmonary effort is normal.  ?   Breath sounds: Normal breath sounds.  ?Abdominal:  ?   General: There is no distension.  ?   Palpations: Abdomen is soft. There is no mass.  ?   Tenderness: There is no abdominal tenderness.  ?Musculoskeletal:     ?   General: Normal range of motion.  ?   Cervical back: Normal range of motion and neck supple.  ?Skin: ?   General: Skin is warm and dry.  ?Neurological:  ?   Mental Status: He is alert and oriented to person, place, and time.  ?   Cranial Nerves: No cranial nerve deficit.  ?   Sensory: No sensory deficit.  ?   Motor: No abnormal muscle tone.  ?   Coordination: Coordination normal.  ?Psychiatric:     ?   Mood and Affect: Mood normal.     ?   Behavior: Behavior normal.     ?   Thought Content: Thought content normal.     ?   Judgment: Judgment normal.  ? ? ?ED Results /  Procedures / Treatments   ?Labs ?(all labs ordered are listed, but only abnormal results are displayed) ?Labs Reviewed  ?COMPREHENSIVE METABOLIC PANEL - Abnormal; Notable for the following components:  ?    Result Value  ? Glucose, Bld 157 (*)   ? BUN 24 (*)   ? Total Protein 8.8 (*)   ? Albumin 5.1 (*)   ? AST 49 (*)   ? All other components within normal limits  ?CBC - Abnormal; Notable for the following components:  ? WBC 16.3 (*)   ? MCHC 36.1 (*)   ? All other components within normal limits  ?CBG MONITORING, ED - Abnormal; Notable for the following components:  ? Glucose-Capillary 129 (*)   ? All other components within normal limits  ?LIPASE, BLOOD  ?ETHANOL  ?URINALYSIS, ROUTINE W REFLEX MICROSCOPIC  ?RAPID URINE DRUG SCREEN, HOSP PERFORMED  ?CBG MONITORING, ED  ? ? ?EKG ?EKG Interpretation ? ?Date/Time:  Sunday July 31 2021 21:44:03 EDT ?Ventricular Rate:  70 ?PR Interval:  106 ?QRS Duration: 100 ?QT Interval:  378 ?QTC Calculation: 408 ?R Axis:   91 ?Text  Interpretation: Sinus or ectopic atrial rhythm Short PR interval Borderline right axis deviation Left ventricular hypertrophy ST elev, probable normal early repol pattern since last tracing no significant change Confirmed by Mancel BaleWentz, Darric Plante [phone]([id number]) on 07/31/2021 9:50:17 PM ? ?Radiology ?CT Abdomen Pelvis W Contrast ? ?Result Date: 07/31/2021 ?CLINICAL DATA:  Abdominal pain, acute, nonlocalized.  Vomiting. EXAM: CT ABDOMEN AND PELVIS WITH CONTRAST TECHNIQUE: Multidetector CT imaging of the abdomen and pelvis was performed using the standard protocol following bolus administration of intravenous contrast. RADIATION DOSE REDUCTION: This exam was performed according to the departmental dose-optimization program which includes automated exposure control, adjustment of the mA and/or kV according to patient size and/or use of iterative reconstruction technique. CONTRAST:  100mL OMNIPAQUE IOHEXOL 300 MG/ML  SOLN COMPARISON:  None. FINDINGS: Lower chest: No acute abnormality Hepatobiliary: No focal hepatic abnormality. Gallbladder unremarkable. Pancreas: No focal abnormality or ductal dilatation. Spleen: No focal abnormality.  Normal size. Adrenals/Urinary Tract: No adrenal abnormality. No focal renal abnormality. No stones or hydronephrosis. Urinary bladder wall appears thickened. Stomach/Bowel: Stomach, large and small bowel grossly unremarkable. Appendix not definitively seen. No pericecal inflammation. Vascular/Lymphatic: No evidence of aneurysm or adenopathy. Reproductive: No visible focal abnormality. Other: No free fluid or free air. Musculoskeletal: No acute bony abnormality. IMPRESSION: Urinary bladder wall appears thickened. Cannot exclude cystitis. Recommend clinical correlation. Otherwise no acute findings. Electronically Signed   By: Charlett NoseKevin  Dover M.D.   On: 07/31/2021 23:12   ? ?Procedures ?Procedures  ? ? ?Medications Ordered in ED ?Medications  ?droperidol (INAPSINE) 2.5 MG/ML injection 2.5 mg (2.5 mg Intravenous  Given 07/31/21 2149)  ?iohexol (OMNIPAQUE) 300 MG/ML solution 100 mL (100 mLs Intravenous Contrast Given 07/31/21 2257)  ? ? ?ED Course/ Medical Decision Making/ A&P ?  ?                        ?Medical Decision Making ?He presents with vomiting and hematemesis after drinking alcohol 2 days ago.  He has a history of diabetes.  He states that he does not currently have a PCP.  He does state he is taking his medicines as prescribed. ? ?Problems Addressed: ?Generalized abdominal pain: acute illness or injury ?Nausea and vomiting, unspecified vomiting type: acute illness or injury ? ?Amount and/or Complexity of Data Reviewed ?Independent Historian: caregiver ?   Details: He is a cogent historian,  but somewhat evasive about answers.  He has a male partner at the bedside who helps to give some answers. ?External Data Reviewed: radiology and notes. ?   Details: Prior evaluation with ultrasound and gastric emptying study, 2 days ago for apparently similar problems; both were normal. ? ?Prior history of multiple medical problems including type I diabetes, DKA, opiate withdrawal syndrome, recurrent nausea and vomiting, transaminitis, elevated bilirubin. ?Labs: ordered. ?   Details: CBC, metabolic panel, lipase, alcohol level, urine drug screen-normal except glucose high, protein high, albumin high, AST high, white count high ?Radiology: ordered and independent interpretation performed. ?   Details: CT abdomen pelvis-no acute abnormalities ? ?Risk ?Prescription drug management. ?Decision regarding hospitalization. ?Risk Details: Patient presenting with vomiting and reported blood in emesis.  He was worried about having DKA.  Short-term illness, without associated worrisome symptoms including fever and diarrhea.  No evidence for DKA.  Suspect mild dehydration causing elevation of protein and albumin.  CT does not show obstruction, or signs for infection.  Patient improved after receiving Inapsine for nausea.  He was comfortable  and had no more vomiting.  Prescription for Compazine sent to his pharmacy.  Recommend PCP follow-up in a week or so and return here if needed. ? ? ? ? ? ? ? ? ? ? ?Final Clinical Impression(s) / ED Diagnoses ?Final

## 2021-07-31 NOTE — ED Triage Notes (Addendum)
Patient arrived with complaints of upper abdominal pain and vomiting since last night. States last time he felt like this he was in DKA, reports he did not check his blood sugar at home. CBG 129 in triage  ?

## 2022-03-12 ENCOUNTER — Emergency Department (HOSPITAL_COMMUNITY)
Admission: EM | Admit: 2022-03-12 | Discharge: 2022-03-13 | Disposition: A | Discharge status: Home / Self Care | Payer: Medicaid Other | Attending: Emergency Medicine | Admitting: Emergency Medicine

## 2022-03-12 ENCOUNTER — Encounter (HOSPITAL_COMMUNITY): Payer: Self-pay

## 2022-03-12 ENCOUNTER — Other Ambulatory Visit: Payer: Self-pay

## 2022-03-12 DIAGNOSIS — R109 Unspecified abdominal pain: Secondary | ICD-10-CM | POA: Insufficient documentation

## 2022-03-12 DIAGNOSIS — R112 Nausea with vomiting, unspecified: Secondary | ICD-10-CM | POA: Insufficient documentation

## 2022-03-12 DIAGNOSIS — R197 Diarrhea, unspecified: Secondary | ICD-10-CM | POA: Insufficient documentation

## 2022-03-12 DIAGNOSIS — Z5321 Procedure and treatment not carried out due to patient leaving prior to being seen by health care provider: Secondary | ICD-10-CM | POA: Diagnosis not present

## 2022-03-12 LAB — CBC
HCT: 41.4 % (ref 39.0–52.0)
Hemoglobin: 14.2 g/dL (ref 13.0–17.0)
MCH: 30.9 pg (ref 26.0–34.0)
MCHC: 34.3 g/dL (ref 30.0–36.0)
MCV: 90.2 fL (ref 80.0–100.0)
Platelets: 291 10*3/uL (ref 150–400)
RBC: 4.59 MIL/uL (ref 4.22–5.81)
RDW: 12 % (ref 11.5–15.5)
WBC: 12.3 10*3/uL — ABNORMAL HIGH (ref 4.0–10.5)
nRBC: 0 % (ref 0.0–0.2)

## 2022-03-12 LAB — COMPREHENSIVE METABOLIC PANEL
ALT: 39 U/L (ref 0–44)
AST: 33 U/L (ref 15–41)
Albumin: 4.4 g/dL (ref 3.5–5.0)
Alkaline Phosphatase: 65 U/L (ref 38–126)
Anion gap: 16 — ABNORMAL HIGH (ref 5–15)
BUN: 22 mg/dL — ABNORMAL HIGH (ref 6–20)
CO2: 20 mmol/L — ABNORMAL LOW (ref 22–32)
Calcium: 9.1 mg/dL (ref 8.9–10.3)
Chloride: 99 mmol/L (ref 98–111)
Creatinine, Ser: 0.74 mg/dL (ref 0.61–1.24)
GFR, Estimated: >60 mL/min (ref >60)
Glucose, Bld: 342 mg/dL — ABNORMAL HIGH (ref 70–99)
Potassium: 4.2 mmol/L (ref 3.5–5.1)
Sodium: 135 mmol/L (ref 135–145)
Total Bilirubin: 2.1 mg/dL — ABNORMAL HIGH (ref 0.3–1.2)
Total Protein: 7.7 g/dL (ref 6.5–8.1)

## 2022-03-12 LAB — LIPASE, BLOOD: Lipase: 21 U/L (ref 11–51)

## 2022-03-12 NOTE — ED Triage Notes (Signed)
Patient ate a poke bowl and thinks he has food poisoning. Having stomach pain and nausea all over. Has been vomiting and having diarrhea.

## 2022-03-12 NOTE — ED Provider Triage Note (Signed)
Emergency Medicine Provider Triage Evaluation Note  Shawn Maldonado , a 36 y.o. male  was evaluated in triage.  Pt complains of abdominal pain, nausea, vomiting, diarrhea x 2 days. Had poke bowl and thinks he has food poisoning. Pt is knoen T1DM, last sugar was 108 this morning  Review of Systems  Positive: Abd pain, N/V/D Negative: fever  Physical Exam  BP 120/78   Pulse 97   Temp 98.7 F (37.1 C) (Oral)   Resp 18   SpO2 100%  Gen:   Awake, no distress   Resp:  Normal effort  MSK:   Moves extremities without difficulty  Other:    Medical Decision Making  Medically screening exam initiated at 12:58 PM.  Appropriate orders placed.  Loma Messing was informed that the remainder of the evaluation will be completed by another provider, this initial triage assessment does not replace that evaluation, and the importance of remaining in the ED until their evaluation is complete.  Workup initiated   Su Monks, PA-C 03/12/22 1258

## 2022-04-26 ENCOUNTER — Emergency Department (HOSPITAL_COMMUNITY)
Admission: EM | Admit: 2022-04-26 | Discharge: 2022-04-26 | Discharge status: Against Medical Advice | Payer: Medicaid Other | Attending: Emergency Medicine | Admitting: Emergency Medicine

## 2022-04-26 LAB — CBG MONITORING, ED: Glucose-Capillary: 200 mg/dL — ABNORMAL HIGH (ref 70–99)

## 2022-04-26 NOTE — ED Notes (Signed)
Pt called multiple times no answer 

## 2022-04-26 NOTE — ED Notes (Signed)
Pt called no answer 

## 2022-05-31 ENCOUNTER — Ambulatory Visit: Payer: Medicaid Other | Attending: Family Medicine | Admitting: Family Medicine

## 2022-05-31 ENCOUNTER — Other Ambulatory Visit: Payer: Self-pay | Admitting: Pharmacist

## 2022-05-31 ENCOUNTER — Telehealth: Payer: Self-pay | Admitting: Family Medicine

## 2022-05-31 ENCOUNTER — Encounter: Payer: Self-pay | Admitting: Family Medicine

## 2022-05-31 VITALS — BP 106/67 | HR 78 | Temp 98.1°F | Ht 70.0 in | Wt 132.4 lb

## 2022-05-31 DIAGNOSIS — E1069 Type 1 diabetes mellitus with other specified complication: Secondary | ICD-10-CM | POA: Diagnosis not present

## 2022-05-31 DIAGNOSIS — Z23 Encounter for immunization: Secondary | ICD-10-CM

## 2022-05-31 DIAGNOSIS — E1042 Type 1 diabetes mellitus with diabetic polyneuropathy: Secondary | ICD-10-CM | POA: Diagnosis not present

## 2022-05-31 LAB — POCT GLYCOSYLATED HEMOGLOBIN (HGB A1C): HbA1c, POC (controlled diabetic range): 11.2 % — AB (ref 0.0–7.0)

## 2022-05-31 LAB — GLUCOSE, POCT (MANUAL RESULT ENTRY): POC Glucose: 148 mg/dl — AB (ref 70–99)

## 2022-05-31 MED ORDER — GABAPENTIN 300 MG PO CAPS
300.0000 mg | ORAL_CAPSULE | Freq: Every day | ORAL | 1 refills | Status: DC
Start: 2022-05-31 — End: 2024-02-27

## 2022-05-31 MED ORDER — DEXCOM G7 RECEIVER DEVI: 0 refills | Status: DC

## 2022-05-31 MED ORDER — DEXCOM G7 SENSOR MISC: 3 refills | Status: DC

## 2022-05-31 MED ORDER — INSULIN ASPART 100 UNIT/ML IJ SOLN: 0.0000 [IU] | Freq: Three times a day (TID) | INTRAMUSCULAR | 3 refills | Status: DC

## 2022-05-31 MED ORDER — INSULIN GLARGINE 100 UNIT/ML ~~LOC~~ SOLN: 25.0000 [IU] | Freq: Every day | SUBCUTANEOUS | 0 refills | Status: DC

## 2022-05-31 NOTE — Telephone Encounter (Signed)
Yes ma'am, done. 

## 2022-05-31 NOTE — Progress Notes (Signed)
Referral to endocrinology for his diabetes. CBG-148  A1C-11.2

## 2022-05-31 NOTE — Patient Instructions (Signed)
For blood sugars 0-150 give 0 units of insulin, 151-200 give 2 units of insulin, 201-250 give 4 units, 251-300 give 6 units, 301-350 give 8 units, 351-400 give 10 units,> 400 give 12 units and call M.D.

## 2022-05-31 NOTE — Telephone Encounter (Signed)
Hi Luke, can you please send a prescription for CGM that is covered by his insurance company to the pharmacy?  Thank you.

## 2022-05-31 NOTE — Progress Notes (Signed)
Subjective:  Patient ID: Shawn Maldonado, male    DOB: 21-Mar-1986  Age: 37 y.o. MRN: [id number]  CC: New Patient (Initial Visit)   HPI Shawn Maldonado is a 37 y.o. year old male with a history of type 1 diabetes mellitus (A1c 11.2) here to establish care.  Interval History:  For his diabetes he was previously followed by [facility] at his last visit in 07/2021 when notes reveal he establish care at that time. He previously lived on [place] but has now moved to [place] and would like to see a local endocrinologist.  For the last year he has been without Lantus but has been trying to eat right and adjusting Novolog from 4-12 units tid according to the instruction from his Endocrinologist.  This morning he administered 8 units of NovoLog.  He has not had any hypoglycemic episodes.  He has no visual concerns and is not up-to-date on annual eye exams. He does have diabetic neuropathy in his legs and symptoms are intermittent but not bothersome. Past Medical History:  Diagnosis Date   Diabetes mellitus without complication (HCC)     Past Surgical History:  Procedure Laterality Date   MOUTH SURGERY      Family History  Problem Relation Age of Onset   Stroke Father    CAD Other     Social History   Socioeconomic History   Marital status: Single    Spouse name: Not on file   Number of children: 2   Years of education: Not on file   Highest education level: Not on file  Occupational History   Occupation: Works in a Environmental consultant.  Tobacco Use   Smoking status: Former    Types: Cigarettes   Smokeless tobacco: Never  Vaping Use   Vaping Use: Never used  Substance and Sexual Activity   Alcohol use: Yes    Comment: social   Drug use: Yes    Types: Marijuana   Sexual activity: Never  Other Topics Concern   Not on file  Social History Narrative   Lives with girlfriend.     Social Determinants of Health   Financial  Resource Strain: Not on file  Food Insecurity: Not on file  Transportation Needs: Not on file  Physical Activity: Not on file  Stress: Not on file  Social Connections: Not on file    No Known Allergies  Outpatient Medications Prior to Visit  Medication Sig Dispense Refill   insulin aspart (NOVOLOG) 100 UNIT/ML injection Inject 4 Units into the skin 3 (three) times daily with meals. Plus sliding scale as directed. 10 mL 0   insulin glargine (LANTUS) 100 UNIT/ML injection Inject 0.25 mLs (25 Units total) into the skin daily. 10 mL 0   amoxicillin (AMOXIL) 500 MG capsule Take 1 capsule (500 mg total) by mouth 3 (three) times daily. (Patient not taking: Reported on 05/31/2022) 21 capsule 0   prochlorperazine (COMPAZINE) 10 MG tablet Take 1 tablet (10 mg total) by mouth every 6 (six) hours as needed for nausea or vomiting. (Patient not taking: Reported on 05/31/2022) 12 tablet 0   No facility-administered medications prior to visit.     ROS Review of Systems  Constitutional:  Negative for activity change and appetite change.  HENT:  Negative for sinus pressure and sore throat.   Respiratory:  Negative for chest tightness, shortness of breath and wheezing.   Cardiovascular:  Negative for chest pain and palpitations.  Gastrointestinal:  Negative for abdominal distention,  abdominal pain and constipation.  Genitourinary: Negative.   Musculoskeletal: Negative.   Psychiatric/Behavioral:  Negative for behavioral problems and dysphoric mood.     Objective:  BP 106/67   Pulse 78   Temp 98.1 F (36.7 C) (Oral)   Ht 5\' 10"  (1.778 m)   Wt 132 lb 6.4 oz (60.1 kg)   SpO2 100%   BMI 19.00 kg/m      05/31/2022    8:56 AM 03/12/2022   12:25 PM 07/31/2021   11:59 PM  BP/Weight  Systolic BP 161 096 045  Diastolic BP 67 78 81  Wt. (Lbs) 132.4    BMI 19 kg/m2        Physical Exam Constitutional:      Appearance: He is well-developed.  Cardiovascular:     Rate and Rhythm: Normal rate.      Heart sounds: Normal heart sounds. No murmur heard. Pulmonary:     Effort: Pulmonary effort is normal.     Breath sounds: Normal breath sounds. No wheezing or rales.  Chest:     Chest wall: No tenderness.  Abdominal:     General: Bowel sounds are normal. There is no distension.     Palpations: Abdomen is soft. There is no mass.     Tenderness: There is no abdominal tenderness.  Musculoskeletal:        General: Normal range of motion.     Right lower leg: No edema.     Left lower leg: No edema.  Neurological:     Mental Status: He is alert and oriented to person, place, and time.  Psychiatric:        Mood and Affect: Mood normal.     Diabetic Foot Exam - Simple   Simple Foot Form Diabetic Foot exam was performed with the following findings: Yes 05/31/2022  9:23 AM  Visual Inspection No deformities, no ulcerations, no other skin breakdown bilaterally: Yes Sensation Testing Intact to touch and monofilament testing bilaterally: Yes Pulse Check Posterior Tibialis and Dorsalis pulse intact bilaterally: Yes Comments        Latest Ref Rng & Units 03/12/2022    1:52 PM 07/31/2021    7:18 PM 01/21/2020   11:12 AM  CMP  Glucose 70 - 99 mg/dL 342  157  209   BUN 6 - 20 mg/dL 22  24  11    Creatinine 0.61 - 1.24 mg/dL 0.74  0.86  0.85   Sodium 135 - 145 mmol/L 135  138  138   Potassium 3.5 - 5.1 mmol/L 4.2  3.6  3.6   Chloride 98 - 111 mmol/L 99  100  101   CO2 22 - 32 mmol/L 20  24  27    Calcium 8.9 - 10.3 mg/dL 9.1  10.2  9.1   Total Protein 6.5 - 8.1 g/dL 7.7  8.8  6.7   Total Bilirubin 0.3 - 1.2 mg/dL 2.1  1.1  2.4   Alkaline Phos 38 - 126 U/L 65  73  55   AST 15 - 41 U/L 33  49  21   ALT 0 - 44 U/L 39  43  23     Lipid Panel     Component Value Date/Time   CHOL 164 07/28/2018 1042   TRIG 43 07/28/2018 1042   HDL 58 07/28/2018 1042   CHOLHDL 2.8 07/28/2018 1042   VLDL 9 07/28/2018 1042   LDLCALC 97 07/28/2018 1042    CBC    Component Value Date/Time  WBC  12.3 (H) 03/12/2022 1352   RBC 4.59 03/12/2022 1352   HGB 14.2 03/12/2022 1352   HCT 41.4 03/12/2022 1352   PLT 291 03/12/2022 1352   MCV 90.2 03/12/2022 1352   MCH 30.9 03/12/2022 1352   MCHC 34.3 03/12/2022 1352   RDW 12.0 03/12/2022 1352   LYMPHSABS 2.3 01/21/2020 1112   MONOABS 1.1 (H) 01/21/2020 1112   EOSABS 0.0 01/21/2020 1112   BASOSABS 0.1 01/21/2020 1112    Lab Results  Component Value Date   HGBA1C 11.2 (A) 05/31/2022    Assessment & Plan:  1. Type 1 diabetes mellitus with other specified complication (HCC) Uncontrolled with A1c of 11.2, goal is less than 7.0 He has been without Lantus for a long time I will have him restart Lantus and I have placed him on NovoLog sliding scale.  Advised to decrease Lantus by 2 units if hypoglycemia occurs. Will send in prescription for CGM as he will benefit from this Counseled on Diabetic diet, my plate method, 564 minutes of moderate intensity exercise/week Blood sugar logs with fasting goals of 80-120 mg/dl, random of less than 180 and in the event of sugars less than 60 mg/dl or greater than 400 mg/dl encouraged to notify the clinic. Advised on the need for annual eye exams, annual foot exams, Pneumonia vaccine. - POCT glycosylated hemoglobin (Hb A1C) - POCT glucose (manual entry) - Ambulatory referral to Endocrinology - insulin glargine (LANTUS) 100 UNIT/ML injection; Inject 0.25 mLs (25 Units total) into the skin daily.  Dispense: 10 mL; Refill: 0 - insulin aspart (NOVOLOG) 100 UNIT/ML injection; Inject 0-12 Units into the skin 3 (three) times daily before meals. Per sliding scale  Dispense: 30 mL; Refill: 3 - Microalbumin/Creatinine Ratio, Urine - Ambulatory referral to Ophthalmology  2. 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  Dispense: 90 capsule; Refill: 1  3. Need  for immunization against influenza - Flu Vaccine QUAD 19mo+IM (Fluarix, Fluzone & Alfiuria Quad PF)     Meds ordered this encounter  Medications   gabapentin (NEURONTIN) 300 MG capsule    Sig: Take 1 capsule (300 mg total) by mouth at bedtime.    Dispense:  90 capsule    Refill:  1   insulin glargine (LANTUS) 100 UNIT/ML injection    Sig: Inject 0.25 mLs (25 Units total) into the skin daily.    Dispense:  10 mL    Refill:  0   insulin aspart (NOVOLOG) 100 UNIT/ML injection    Sig: Inject 0-12 Units into the skin 3 (three) times daily before meals. Per sliding scale    Dispense:  30 mL    Refill:  3    For blood sugars 0-150 give 0 units of insulin, 151-200 give 2 units of insulin, 201-250 give 4 units, 251-300 give 6 units, 301-350 give 8 units, 351-400 give 10 units,> 400 give 12 units and call M.D.    Follow-up: Return in about 3 months (around 08/29/2022) for Chronic medical conditions.       Charlott Rakes, MD, FAAFP. [facility] [place], [place]   05/31/2022, 12:09 PM

## 2022-06-02 LAB — MICROALBUMIN / CREATININE URINE RATIO
Creatinine, Urine: 216 mg/dL
Microalb/Creat Ratio: 9 mg/g creat (ref 0–29)
Microalbumin, Urine: 19.5 ug/mL

## 2022-08-13 ENCOUNTER — Other Ambulatory Visit: Payer: Self-pay | Admitting: Family Medicine

## 2022-08-13 DIAGNOSIS — E1069 Type 1 diabetes mellitus with other specified complication: Secondary | ICD-10-CM

## 2022-08-15 ENCOUNTER — Encounter (HOSPITAL_COMMUNITY): Payer: Self-pay

## 2022-08-15 ENCOUNTER — Emergency Department (HOSPITAL_COMMUNITY)
Admission: EM | Admit: 2022-08-15 | Discharge: 2022-08-15 | Disposition: A | Discharge status: Home / Self Care | Payer: Medicaid Other | Attending: Emergency Medicine | Admitting: Emergency Medicine

## 2022-08-15 ENCOUNTER — Other Ambulatory Visit: Payer: Self-pay

## 2022-08-15 DIAGNOSIS — E109 Type 1 diabetes mellitus without complications: Secondary | ICD-10-CM | POA: Insufficient documentation

## 2022-08-15 DIAGNOSIS — E7132 Disorders of ketone metabolism: Secondary | ICD-10-CM | POA: Diagnosis not present

## 2022-08-15 DIAGNOSIS — D72829 Elevated white blood cell count, unspecified: Secondary | ICD-10-CM | POA: Insufficient documentation

## 2022-08-15 DIAGNOSIS — Z794 Long term (current) use of insulin: Secondary | ICD-10-CM | POA: Diagnosis not present

## 2022-08-15 DIAGNOSIS — K529 Noninfective gastroenteritis and colitis, unspecified: Secondary | ICD-10-CM

## 2022-08-15 DIAGNOSIS — R112 Nausea with vomiting, unspecified: Secondary | ICD-10-CM | POA: Diagnosis present

## 2022-08-15 DIAGNOSIS — R110 Nausea: Secondary | ICD-10-CM

## 2022-08-15 LAB — CBC WITH DIFFERENTIAL/PLATELET
Abs Immature Granulocytes: 0.03 10*3/uL (ref 0.00–0.07)
Basophils Absolute: 0 10*3/uL (ref 0.0–0.1)
Basophils Relative: 0 %
Eosinophils Absolute: 0 10*3/uL (ref 0.0–0.5)
Eosinophils Relative: 0 %
HCT: 42.6 % (ref 39.0–52.0)
Hemoglobin: 14.8 g/dL (ref 13.0–17.0)
Immature Granulocytes: 0 %
Lymphocytes Relative: 12 %
Lymphs Abs: 1.5 10*3/uL (ref 0.7–4.0)
MCH: 30.5 pg (ref 26.0–34.0)
MCHC: 34.7 g/dL (ref 30.0–36.0)
MCV: 87.7 fL (ref 80.0–100.0)
Monocytes Absolute: 0.5 10*3/uL (ref 0.1–1.0)
Monocytes Relative: 4 %
Neutro Abs: 10 10*3/uL — ABNORMAL HIGH (ref 1.7–7.7)
Neutrophils Relative %: 84 %
Platelets: 266 10*3/uL (ref 150–400)
RBC: 4.86 MIL/uL (ref 4.22–5.81)
RDW: 12 % (ref 11.5–15.5)
WBC: 12 10*3/uL — ABNORMAL HIGH (ref 4.0–10.5)
nRBC: 0 % (ref 0.0–0.2)

## 2022-08-15 LAB — BETA-HYDROXYBUTYRIC ACID: Beta-Hydroxybutyric Acid: 2.92 mmol/L — ABNORMAL HIGH (ref 0.05–0.27)

## 2022-08-15 LAB — BLOOD GAS, VENOUS
Acid-base deficit: 0.2 mmol/L (ref 0.0–2.0)
Bicarbonate: 24 mmol/L (ref 20.0–28.0)
O2 Saturation: 86.1 %
Patient temperature: 37
pCO2, Ven: 37 mmHg — ABNORMAL LOW (ref 44–60)
pH, Ven: 7.42 (ref 7.25–7.43)
pO2, Ven: 50 mmHg — ABNORMAL HIGH (ref 32–45)

## 2022-08-15 LAB — COMPREHENSIVE METABOLIC PANEL
ALT: 24 U/L (ref 0–44)
AST: 28 U/L (ref 15–41)
Albumin: 4.4 g/dL (ref 3.5–5.0)
Alkaline Phosphatase: 61 U/L (ref 38–126)
Anion gap: 7 (ref 5–15)
BUN: 21 mg/dL — ABNORMAL HIGH (ref 6–20)
CO2: 22 mmol/L (ref 22–32)
Calcium: 8.7 mg/dL — ABNORMAL LOW (ref 8.9–10.3)
Chloride: 102 mmol/L (ref 98–111)
Creatinine, Ser: 0.76 mg/dL (ref 0.61–1.24)
GFR, Estimated: >60 mL/min (ref >60)
Glucose, Bld: 231 mg/dL — ABNORMAL HIGH (ref 70–99)
Potassium: 3.9 mmol/L (ref 3.5–5.1)
Sodium: 131 mmol/L — ABNORMAL LOW (ref 135–145)
Total Bilirubin: 1.5 mg/dL — ABNORMAL HIGH (ref 0.3–1.2)
Total Protein: 7.5 g/dL (ref 6.5–8.1)

## 2022-08-15 LAB — CBG MONITORING, ED: Glucose-Capillary: 235 mg/dL — ABNORMAL HIGH (ref 70–99)

## 2022-08-15 LAB — LIPASE, BLOOD: Lipase: 21 U/L (ref 11–51)

## 2022-08-15 MED ORDER — FAMOTIDINE 20 MG PO TABS
20.0000 mg | ORAL_TABLET | Freq: Once | ORAL | Status: AC
  Administered 2022-08-15: 20 mg via ORAL
  Filled 2022-08-15: qty 1

## 2022-08-15 MED ORDER — LACTATED RINGERS IV BOLUS
1000.0000 mL | Freq: Once | INTRAVENOUS | Status: AC
  Administered 2022-08-15: 1000 mL via INTRAVENOUS

## 2022-08-15 MED ORDER — MORPHINE SULFATE (PF) 4 MG/ML IV SOLN
4.0000 mg | Freq: Once | INTRAVENOUS | Status: AC
  Administered 2022-08-15: 4 mg via INTRAVENOUS
  Filled 2022-08-15: qty 1

## 2022-08-15 MED ORDER — ONDANSETRON HCL 4 MG/2ML IJ SOLN
4.0000 mg | Freq: Once | INTRAMUSCULAR | Status: AC
  Administered 2022-08-15: 4 mg via INTRAVENOUS
  Filled 2022-08-15: qty 2

## 2022-08-15 MED ORDER — DICYCLOMINE HCL 20 MG PO TABS: 20.0000 mg | ORAL_TABLET | Freq: Two times a day (BID) | ORAL | 0 refills | Status: DC

## 2022-08-15 MED ORDER — ONDANSETRON HCL 4 MG PO TABS: 4.0000 mg | ORAL_TABLET | Freq: Four times a day (QID) | ORAL | 0 refills | Status: DC

## 2022-08-15 MED ORDER — ALUM & MAG HYDROXIDE-SIMETH 200-200-20 MG/5ML PO SUSP
30.0000 mL | Freq: Once | ORAL | Status: AC
  Administered 2022-08-15: 30 mL via ORAL
  Filled 2022-08-15: qty 30

## 2022-08-15 MED ORDER — DICYCLOMINE HCL 10 MG PO CAPS
10.0000 mg | ORAL_CAPSULE | Freq: Once | ORAL | Status: AC
  Administered 2022-08-15: 10 mg via ORAL
  Filled 2022-08-15: qty 1

## 2022-08-15 MED ORDER — FAMOTIDINE 20 MG PO TABS: 20.0000 mg | ORAL_TABLET | Freq: Two times a day (BID) | ORAL | 0 refills | Status: DC

## 2022-08-15 NOTE — ED Notes (Signed)
Patient verbalizes understanding of discharge instructions. Opportunity for questioning and answers were provided. Armband removed by staff, pt discharged from ED. Wheeled out to lobby, wife coming to pick up

## 2022-08-15 NOTE — Telephone Encounter (Signed)
Requested Prescriptions  Pending Prescriptions Disp Refills   LANTUS 100 UNIT/ML injection [Pharmacy Med Name: LANTUS U-100 INSULIN 10ML] 10 mL 1    Sig: ADMINISTER 25 UNITS UNDER THE SKIN DAILY     Endocrinology:  Diabetes - Insulins Failed - 08/13/2022 11:04 PM      Failed - HBA1C is between 0 and 7.9 and within 180 days    HbA1c, POC (controlled diabetic range)  Date Value Ref Range Status  05/31/2022 11.2 (A) 0.0 - 7.0 % Final         Passed - Valid encounter within last 6 months    Recent Outpatient Visits           2 months ago Type 1 diabetes mellitus with other specified complication [facility])   [facility] Hoy Register, MD       Future Appointments             In 2 weeks Hoy Register, MD [facility]

## 2022-08-15 NOTE — ED Provider Notes (Signed)
[place] [facility]ENCY DEPARTMENT AT [facility] Provider Note   CSN: [id number] Arrival date & time: 08/15/22  1616     History  Chief Complaint  Patient presents with   Nausea   Emesis    Shawn Maldonado is a 37 y.o. male.  Patient is a 37 year old male with a past medical history of diabetes presenting to the emergency department with nausea, vomiting and diarrhea.  Patient states he has had symptoms for the last 2 days.  He states that he has associated lower abdominal cramping.  He denies any black or bloody stools, he denies any fevers but states that he has felt chilled.  He states that his sugars have been running high in the last few days since he has been sick.  The history is provided by the patient.  Emesis      Home Medications Prior to Admission medications   Medication Sig Start Date End Date Taking? Authorizing Provider  dicyclomine (BENTYL) 20 MG tablet Take 1 tablet (20 mg total) by mouth 2 (two) times daily. 08/15/22  Yes Theresia Lo, Benetta Spar K, DO  famotidine (PEPCID) 20 MG tablet Take 1 tablet (20 mg total) by mouth 2 (two) times daily. 08/15/22  Yes Theresia Lo, [place] K, DO  ondansetron (ZOFRAN) 4 MG tablet Take 1 tablet (4 mg total) by mouth every 6 (six) hours. 08/15/22  Yes Theresia Lo, [place] K, DO  amoxicillin (AMOXIL) 500 MG capsule Take 1 capsule (500 mg total) by mouth 3 (three) times daily. Patient not taking: Reported on 05/31/2022 01/04/21   Koleen Distance, MD  Continuous Blood Gluc Receiver (DEXCOM G7 RECEIVER) DEVI Check blood sugar three times daily. E10.69 05/31/22   Hoy Register, MD  Continuous Blood Gluc Sensor (DEXCOM G7 SENSOR) MISC Check blood sugar three times daily. Change sensors once every 10 days. E10.69 05/31/22   Hoy Register, MD  gabapentin (NEURONTIN) 300 MG capsule Take 1 capsule (300 mg total) by mouth at bedtime. 05/31/22   Hoy Register, MD  insulin aspart (NOVOLOG) 100 UNIT/ML injection Inject 0-12 Units into the  skin 3 (three) times daily before meals. Per sliding scale 05/31/22   Hoy Register, MD  insulin glargine (LANTUS) 100 UNIT/ML injection ADMINISTER 25 UNITS UNDER THE SKIN DAILY 08/15/22   Hoy Register, MD  prochlorperazine (COMPAZINE) 10 MG tablet Take 1 tablet (10 mg total) by mouth every 6 (six) hours as needed for nausea or vomiting. Patient not taking: Reported on 05/31/2022 07/31/21   Mancel Bale, MD      Allergies    Patient has no known allergies.    Review of Systems   Review of Systems  Gastrointestinal:  Positive for vomiting.    Physical Exam Updated Vital Signs BP (!) 145/86   Pulse 81   Temp 98.5 F (36.9 C) (Oral)   Resp 18   Ht  (1.778 m)   Wt 61.2 kg   SpO2 93%   BMI 19.37 kg/m  Physical Exam Vitals and nursing note reviewed.  Constitutional:      Appearance: Normal appearance. He is ill-appearing. He is not toxic-appearing or diaphoretic.  HENT:     Head: Normocephalic and atraumatic.     Nose: Nose normal.     Mouth/Throat:     Mouth: Mucous membranes are moist.     Pharynx: Oropharynx is clear.  Eyes:     Extraocular Movements: Extraocular movements intact.     Conjunctiva/sclera: Conjunctivae normal.  Cardiovascular:     Rate and  Rhythm: Normal rate and regular rhythm.     Heart sounds: Normal heart sounds.  Pulmonary:     Effort: Pulmonary effort is normal.     Breath sounds: Normal breath sounds.  Abdominal:     General: Abdomen is flat.     Palpations: Abdomen is soft.     Tenderness: There is no abdominal tenderness.  Musculoskeletal:        General: Normal range of motion.     Cervical back: Normal range of motion and neck supple.  Skin:    General: Skin is warm and dry.  Neurological:     General: No focal deficit present.     Mental Status: He is alert and oriented to person, place, and time.  Psychiatric:        Mood and Affect: Mood normal.        Behavior: Behavior normal.     ED Results / Procedures / Treatments    Labs (all labs ordered are listed, but only abnormal results are displayed) Labs Reviewed  COMPREHENSIVE METABOLIC PANEL - Abnormal; Notable for the following components:      Result Value   Sodium 131 (*)    Glucose, Bld 231 (*)    BUN 21 (*)    Calcium 8.7 (*)    Total Bilirubin 1.5 (*)    All other components within normal limits  CBC WITH DIFFERENTIAL/PLATELET - Abnormal; Notable for the following components:   WBC 12.0 (*)    Neutro Abs 10.0 (*)    All other components within normal limits  BLOOD GAS, VENOUS - Abnormal; Notable for the following components:   pCO2, Ven 37 (*)    pO2, Ven 50 (*)    All other components within normal limits  BETA-HYDROXYBUTYRIC ACID - Abnormal; Notable for the following components:   Beta-Hydroxybutyric Acid 2.92 (*)    All other components within normal limits  CBG MONITORING, ED - Abnormal; Notable for the following components:   Glucose-Capillary 235 (*)    All other components within normal limits  LIPASE, BLOOD    EKG EKG Interpretation  Date/Time:  Tuesday August 15 2022 16:26:28 EDT Ventricular Rate:  80 PR Interval:  112 QRS Duration: 89 QT Interval:  345 QTC Calculation: 398 R Axis:   96 Text Interpretation: Sinus rhythm Borderline short PR interval Borderline right axis deviation Consider left ventricular hypertrophy Baseline wander in lead(s) II aVF V2 No significant change since last tracing Confirmed by Elayne Snare ([id number]) on 08/15/2022 5:33:10 PM  Radiology No results found.  Procedures Procedures    Medications Ordered in ED Medications  lactated ringers bolus 1,000 mL (0 mLs Intravenous Stopped 08/15/22 1933)  ondansetron (ZOFRAN) injection 4 mg (4 mg Intravenous Given 08/15/22 1731)  morphine (PF) 4 MG/ML injection 4 mg (4 mg Intravenous Given 08/15/22 1732)  famotidine (PEPCID) tablet 20 mg (20 mg Oral Given 08/15/22 1949)  alum & mag hydroxide-simeth (MAALOX/MYLANTA) 200-200-20 MG/5ML suspension 30 mL (30  mLs Oral Given 08/15/22 1949)  dicyclomine (BENTYL) capsule 10 mg (10 mg Oral Given 08/15/22 1950)    ED Course/ Medical Decision Making/ A&P Clinical Course as of 08/15/22 2017  Tue Aug 15, 2022  1852 BHB is elevated but no AGAP and not acidotic making DKA unlikely. [VK]  1928 Patient reports improvement of his nausea but states he feels like he is having heartburn and abdominal cramps. He will be given GI cocktail and bentyl. [VK]  2015 Patient's pain improved with GI cocktail and  bentyl. He is stable for discharge home with PCP follow up. [VK]    Clinical Course User Index [VK] Rexford Maus, DO                             Medical Decision Making This patient presents to the ED with chief complaint(s) of N/V/D with pertinent past medical history of DM1 which further complicates the presenting complaint. The complaint involves an extensive differential diagnosis and also carries with it a high risk of complications and morbidity.    The differential diagnosis includes hyperglycemic crisis, DKA, dehydration, pancreatitis, hepatitis, gastroenteritis, electrolyte abnormality, patient has no point abdominal tenderness making intra-abdominal infection less likely  Additional history obtained: Additional history obtained from N/A Records reviewed Primary Care Documents  ED Course and Reassessment: On patient's arrival to the emergency department he is mildly ill and uncomfortable appearing though hemodynamically stable in no acute distress.  Patient will have Accu-Chek performed and labs including VBG and BHB to evaluate for causes of his symptoms.  He will be started on IV fluids and given Zofran and morphine for pain control and will be closely reassessed.  Independent labs interpretation:  The following labs were independently interpreted: Mildly elevated BHB, otherwise no other signs of DKA, mild leukocytosis  Independent visualization of imaging: N/A  Consultation: -  Consulted or discussed management/test interpretation w/ external professional: N/A  Consideration for admission or further workup: Patient has no emergent conditions requiring admission or further work-up at this time and is stable for discharge home with primary care follow-up  Social Determinants of health: N/A    Amount and/or Complexity of Data Reviewed Labs: ordered.  Risk OTC drugs. Prescription drug management.          Final Clinical Impression(s) / ED Diagnoses Final diagnoses:  Gastroenteritis  Nausea    Rx / DC Orders ED Discharge Orders          Ordered    ondansetron (ZOFRAN) 4 MG tablet  Every 6 hours        08/15/22 2016    dicyclomine (BENTYL) 20 MG tablet  2 times daily        08/15/22 2016    famotidine (PEPCID) 20 MG tablet  2 times daily        08/15/22 2016              Rexford Maus, DO 08/15/22 2017

## 2022-08-15 NOTE — ED Triage Notes (Signed)
Patient brought in by EMS due to nausea, vomiting and diarrhea X 2 days. Reports feeling weak for the past few days. Pt has hx diabetes. Received LR and  zofran in route. C/O abdominal pain.

## 2022-08-15 NOTE — Discharge Instructions (Addendum)
Seen in the emergency department for your nausea, vomiting and diarrhea.  You had no signs of complications from your diabetes or severe dehydration.  You can continue to take Zofran as needed for nausea and take Pepcid and Bentyl as needed for heartburn and abdominal cramping.  He should make sure that you are drinking plenty of fluids and staying well-hydrated.  You should follow-up with your primary doctor in the next few days to have your symptoms rechecked.  You should return to the emergency department for fevers, significantly worsening pain, repetitive vomiting despite taking the nausea medicine, you are unable to read your blood sugar on your monitor or if you have any other new or concerning symptoms.

## 2022-08-17 ENCOUNTER — Telehealth: Payer: Self-pay | Admitting: Pharmacist

## 2022-08-17 ENCOUNTER — Other Ambulatory Visit: Payer: Self-pay | Admitting: Pharmacist

## 2022-08-17 ENCOUNTER — Ambulatory Visit: Payer: Self-pay | Admitting: *Deleted

## 2022-08-17 DIAGNOSIS — E1069 Type 1 diabetes mellitus with other specified complication: Secondary | ICD-10-CM

## 2022-08-17 MED ORDER — INSULIN GLARGINE 100 UNIT/ML ~~LOC~~ SOLN
SUBCUTANEOUS | 1 refills | Status: DC
Start: 2022-08-17 — End: 2022-08-30

## 2022-08-17 NOTE — Telephone Encounter (Addendum)
  Chief Complaint: Glucose 459 this morning and vomited once.   Needs hospital f/u appt.   Seen 08/15/2022 In ED for vomiting and abd pain. Symptoms: This morning glucose 459 and vomited once.  Wanting an earlier appt than the one on 08/30/2022. Frequency: Last night glucose was 117. Pertinent Negatives: Patient denies dizziness and only vomited once this morning. Disposition: ED /[] Urgent Care (no appt availability in office) / Appointment(In office/virtual)/  [facility]/ Home Care/ Refused Recommended Disposition /[] [facility]/  Follow-up with PCP Additional Notes: Message sent for a hospital follow up appt. And for advice for elevated glucose this morning.   He is also out of his Lantus insulin.  I sent a Teams message to Elsie Lincoln, RN.   She is not in. I've also sent a message to [facility] high priority. I called the flow coordinator number [phone] and got a recording "The operator is not available".  I called the [phone] once with the "9" in front of it and also without the "9" in front of it and got a fast busy signal both times.     I called the other number listed on One Note [phone] and did not get an answer after several rings.    I've sent a Teams message to Guy Franco, RN too for someone to contact pt.

## 2022-08-17 NOTE — Telephone Encounter (Signed)
Opened chart to answer question for agent.    I triaged this pt earlier today and sent a Teams message to [facility] and [facility].   Guy Franco, RN is going to follow up with him regarding an earlier appt.

## 2022-08-17 NOTE — Telephone Encounter (Signed)
Reason for Disposition  Blood glucose > 400 mg/dL (16.1 mmol/L)  Answer Assessment - Initial Assessment Questions 1. BLOOD GLUCOSE: "What is your blood glucose level?"      459 glucose this morning.   Last night it was 117. I went to the ED on Tues.   I keep vomiting and having abd pain in the ED.    I need a follow up appt for being in the hospital.    I don't have my lantus. 2. ONSET: "When did you check the blood glucose?"     This morning 459.   I woke up vomiting.    3. USUAL RANGE: "What is your glucose level usually?" (e.g., usual fasting morning value, usual evening value)     170 is my usual.    4. KETONES: "Do you check for ketones (urine or blood test strips)?" If Yes, ask: "What does the test show now?"      Not asked 5. TYPE 1 or 2:  "Do you know what type of diabetes you have?"  (e.g., Type 1, Type 2, Gestational; doesn't know)      Type 1.  6. INSULIN: "Do you take insulin?" "What type of insulin(s) do you use? What is the mode of delivery? (syringe, pen; injection or pump)?"      Lantus insulin.    I finished the bottle.    117 last night glucose. 7. DIABETES PILLS: "Do you take any pills for your diabetes?" If Yes, ask: "Have you missed taking any pills recently?"     No 8. OTHER SYMPTOMS: "Do you have any symptoms?" (e.g., fever, frequent urination, difficulty breathing, dizziness, weakness, vomiting)     Vomiting and heartburn this morning.   My glucose high. 9. PREGNANCY: "Is there any chance you are pregnant?" "When was your last menstrual period?"     N/A  Protocols used: Diabetes - High Blood Sugar-A-AH

## 2022-08-17 NOTE — Telephone Encounter (Signed)
Noted! Thank you

## 2022-08-17 NOTE — Telephone Encounter (Signed)
Call placed to patient. Confirmed I was speaking to the patient using two identifiers. Introduced myself and the reason for my call.   Pt was seen in the ED 08/15/22 with 2-day onset of NV, D. He told the ED providers that his sugars have been running high. He was provided with IV hydration, pain control, nausea medication. Lipase was negative. He was not thought to be in DKA.   He tells me today that he ran out of his Lantus prior to that ED visit and has "not been doing right" for some time. He is requesting refills on his Lantus. Tells me he has been using his Novolog as prescribed. Blood sugars have been consistently >250 since ED visit but attributes this to being without Lantus. He denies any current NV, abdominal pain. Denies any hypoglycemia and does not note any CBG readings >400. His diet is not the best but he is able to eat and is not skipping meals. He also requests refills to be sent on his Dexcom supplies. He knows how to use the Dexcom, he just has not been compliant with monitoring his sugars at home. He wants to do better.   Refills for Lantus and Dexcom supplies sent. I also provided the patient with my extension to contact should his insurance prefer different products. Reminded him of his doses and he verbalizes understanding. He also plans to keep his 08/30/22 appt with his PCP. All current questions and concerns addressed.

## 2022-08-23 ENCOUNTER — Other Ambulatory Visit: Payer: Self-pay

## 2022-08-30 ENCOUNTER — Encounter: Payer: Self-pay | Admitting: Family Medicine

## 2022-08-30 ENCOUNTER — Ambulatory Visit: Payer: Medicaid Other | Attending: Critical Care Medicine | Admitting: Family Medicine

## 2022-08-30 VITALS — BP 118/71 | HR 83 | Ht 70.0 in | Wt 140.4 lb

## 2022-08-30 DIAGNOSIS — E1069 Type 1 diabetes mellitus with other specified complication: Secondary | ICD-10-CM | POA: Diagnosis not present

## 2022-08-30 DIAGNOSIS — K219 Gastro-esophageal reflux disease without esophagitis: Secondary | ICD-10-CM | POA: Diagnosis not present

## 2022-08-30 LAB — POCT GLYCOSYLATED HEMOGLOBIN (HGB A1C): HbA1c, POC (controlled diabetic range): 10.5 % — AB (ref 0.0–7.0)

## 2022-08-30 LAB — GLUCOSE, POCT (MANUAL RESULT ENTRY): POC Glucose: 190 mg/dl — AB (ref 70–99)

## 2022-08-30 MED ORDER — INSULIN GLARGINE 100 UNIT/ML ~~LOC~~ SOLN: SUBCUTANEOUS | 6 refills | Status: DC

## 2022-08-30 MED ORDER — OMEPRAZOLE 40 MG PO CPDR
40.0000 mg | DELAYED_RELEASE_CAPSULE | Freq: Every day | ORAL | 1 refills | Status: DC
Start: 2022-08-30 — End: 2022-10-30

## 2022-08-30 MED ORDER — INSULIN ASPART 100 UNIT/ML IJ SOLN
0.0000 [IU] | Freq: Three times a day (TID) | INTRAMUSCULAR | 6 refills | Status: DC
Start: 2022-08-30 — End: 2023-04-12

## 2022-08-30 NOTE — Progress Notes (Signed)
Subjective:  Patient ID: Shawn Maldonado, male    DOB: 02/28/1986  Age: 37 y.o. MRN: [id number]  CC: Diabetes   HPI Shawn Maldonado is a 37 y.o. year old male with a history of type 1 diabetes mellitus (A1c 11.2).  Interval History:  I had referred him to endocrine at his last visit 4 months ago and referral was sent to [facility] endocrine.  He is yet to hear from them.  CGM data:  14 day average- 186 Time in range - 48% High -28% Very high- 20% Low- 2% Very low- 2% CGM graph does show spikes and dips in the pattern. He has not been administering 25 units qhs He takes 10-15 units qam and at dinner does 10 units.  He administers his NovoLog sliding scale. He is working on adhering to diabetic diet but has noticed that he has heartburn when he eats broccoli.  He was prescribed famotidine after he presented to the ED for gastroenteritis symptoms which he states have resolved. Past Medical History:  Diagnosis Date   Diabetes mellitus without complication (HCC)     Past Surgical History:  Procedure Laterality Date   MOUTH SURGERY      Family History  Problem Relation Age of Onset   Stroke Father    CAD Other     Social History   Socioeconomic History   Marital status: Single    Spouse name: Not on file   Number of children: 2   Years of education: Not on file   Highest education level: Not on file  Occupational History   Occupation: Works in a Science writer.  Tobacco Use   Smoking status: Former    Types: Cigarettes   Smokeless tobacco: Never  Vaping Use   Vaping Use: Never used  Substance and Sexual Activity   Alcohol use: Yes    Comment: social   Drug use: Yes    Types: Marijuana   Sexual activity: Never  Other Topics Concern   Not on file  Social History Narrative   Lives with girlfriend.     Social Determinants of Health   Financial Resource Strain: Not on file  Food Insecurity: Not on file  Transportation Needs: Not on file   Physical Activity: Not on file  Stress: Not on file  Social Connections: Not on file    No Known Allergies  Outpatient Medications Prior to Visit  Medication Sig Dispense Refill   Continuous Blood Gluc Receiver (DEXCOM G7 RECEIVER) DEVI Check blood sugar three times daily. E10.69 1 each 0   Continuous Blood Gluc Sensor (DEXCOM G7 SENSOR) MISC Check blood sugar three times daily. Change sensors once every 10 days. E10.69 3 each 3   dicyclomine (BENTYL) 20 MG tablet Take 1 tablet (20 mg total) by mouth 2 (two) times daily. 20 tablet 0   famotidine (PEPCID) 20 MG tablet Take 1 tablet (20 mg total) by mouth 2 (two) times daily. 30 tablet 0   gabapentin (NEURONTIN) 300 MG capsule Take 1 capsule (300 mg total) by mouth at bedtime. 90 capsule 1   insulin aspart (NOVOLOG) 100 UNIT/ML injection Inject 0-12 Units into the skin 3 (three) times daily before meals. Per sliding scale 30 mL 3   insulin glargine (LANTUS) 100 UNIT/ML injection ADMINISTER 25 UNITS UNDER THE SKIN DAILY 10 mL 1   amoxicillin (AMOXIL) 500 MG capsule Take 1 capsule (500 mg total) by mouth 3 (three) times daily. (Patient not taking: Reported on 05/31/2022) 21  capsule 0   ondansetron (ZOFRAN) 4 MG tablet Take 1 tablet (4 mg total) by mouth every 6 (six) hours. (Patient not taking: Reported on 08/30/2022) 12 tablet 0   prochlorperazine (COMPAZINE) 10 MG tablet Take 1 tablet (10 mg total) by mouth every 6 (six) hours as needed for nausea or vomiting. (Patient not taking: Reported on 08/30/2022) 12 tablet 0   No facility-administered medications prior to visit.     ROS Review of Systems  Constitutional:  Negative for activity change and appetite change.  HENT:  Negative for sinus pressure and sore throat.   Respiratory:  Negative for chest tightness, shortness of breath and wheezing.   Cardiovascular:  Negative for chest pain and palpitations.  Gastrointestinal:  Negative for abdominal distention, abdominal pain and constipation.   Genitourinary: Negative.   Musculoskeletal: Negative.   Psychiatric/Behavioral:  Negative for behavioral problems and dysphoric mood.     Objective:  BP 118/71   Pulse 83   Ht 5\' 10"  (1.778 m)   Wt 140 lb 6.4 oz (63.7 kg)   SpO2 100%   BMI 20.15 kg/m      08/30/2022    9:13 AM 08/15/2022    8:15 PM 08/15/2022    8:00 PM  BP/Weight  Systolic BP 118 157 145  Diastolic BP 71 94 86  Wt. (Lbs) 140.4    BMI 20.15 kg/m2        Physical Exam Constitutional:      Appearance: He is well-developed.  Cardiovascular:     Rate and Rhythm: Normal rate.     Heart sounds: Normal heart sounds. No murmur heard. Pulmonary:     Effort: Pulmonary effort is normal.     Breath sounds: Normal breath sounds. No wheezing or rales.  Chest:     Chest wall: No tenderness.  Abdominal:     General: Bowel sounds are normal. There is no distension.     Palpations: Abdomen is soft. There is no mass.     Tenderness: There is no abdominal tenderness.  Musculoskeletal:        General: Normal range of motion.     Right lower leg: No edema.     Left lower leg: No edema.  Neurological:     Mental Status: He is alert and oriented to person, place, and time.  Psychiatric:        Mood and Affect: Mood normal.        Latest Ref Rng & Units 08/15/2022    5:35 PM 03/12/2022    1:52 PM 07/31/2021    7:18 PM  CMP  Glucose 70 - 99 mg/dL 161  096  045   BUN 6 - 20 mg/dL 21  22  24    Creatinine 0.61 - 1.24 mg/dL 4.09  8.11  9.14   Sodium 135 - 145 mmol/L 131  135  138   Potassium 3.5 - 5.1 mmol/L 3.9  4.2  3.6   Chloride 98 - 111 mmol/L 102  99  100   CO2 22 - 32 mmol/L 22  20  24    Calcium 8.9 - 10.3 mg/dL 8.7  9.1  78.2   Total Protein 6.5 - 8.1 g/dL 7.5  7.7  8.8   Total Bilirubin 0.3 - 1.2 mg/dL 1.5  2.1  1.1   Alkaline Phos 38 - 126 U/L 61  65  73   AST 15 - 41 U/L 28  33  49   ALT 0 - 44 U/L 24  39  43     Lipid Panel     Component Value Date/Time   CHOL 164 07/28/2018 1042   TRIG 43  07/28/2018 1042   HDL 58 07/28/2018 1042   CHOLHDL 2.8 07/28/2018 1042   VLDL 9 07/28/2018 1042   LDLCALC 97 07/28/2018 1042    CBC    Component Value Date/Time   WBC 12.0 (H) 08/15/2022 1735   RBC 4.86 08/15/2022 1735   HGB 14.8 08/15/2022 1735   HCT 42.6 08/15/2022 1735   PLT 266 08/15/2022 1735   MCV 87.7 08/15/2022 1735   MCH 30.5 08/15/2022 1735   MCHC 34.7 08/15/2022 1735   RDW 12.0 08/15/2022 1735   LYMPHSABS 1.5 08/15/2022 1735   MONOABS 0.5 08/15/2022 1735   EOSABS 0.0 08/15/2022 1735   BASOSABS 0.0 08/15/2022 1735    Lab Results  Component Value Date   HGBA1C 10.5 (A) 08/30/2022    Assessment & Plan:  1. Type 1 diabetes mellitus with other specified complication (HCC) Uncontrolled with A1c of 10.5 He has been experiencing spikes and dips of his blood sugar likely due to increased amount of NovoLog administered and decreased Lantus dosing He has been advised to consistently administer 25 units of Lantus nightly and then his NovoLog sliding scale will depend on his blood sugar readings Will review his CGM data at next visit - POCT glycosylated hemoglobin (Hb A1C) - POCT glucose (manual entry) - insulin aspart (NOVOLOG) 100 UNIT/ML injection; Inject 0-12 Units into the skin 3 (three) times daily before meals. Per sliding scale  Dispense: 30 mL; Refill: 6 - insulin glargine (LANTUS) 100 UNIT/ML injection; ADMINISTER 25 UNITS UNDER THE SKIN DAILY  Dispense: 30 mL; Refill: 6  2. Gastroesophageal reflux disease without esophagitis Uncontrolled Will place him on omeprazole Advised to avoid recumbency up to 2 hours postmeal, avoid late meals, avoid foods that trigger symptoms. - omeprazole (PRILOSEC) 40 MG capsule; Take 1 capsule (40 mg total) by mouth daily.  Dispense: 90 capsule; Refill: 1    Meds ordered this encounter  Medications   omeprazole (PRILOSEC) 40 MG capsule    Sig: Take 1 capsule (40 mg total) by mouth daily.    Dispense:  90 capsule    Refill:  1    insulin aspart (NOVOLOG) 100 UNIT/ML injection    Sig: Inject 0-12 Units into the skin 3 (three) times daily before meals. Per sliding scale    Dispense:  30 mL    Refill:  6    For blood sugars 0-150 give 0 units of insulin, 151-200 give 2 units of insulin, 201-250 give 4 units, 251-300 give 6 units, 301-350 give 8 units, 351-400 give 10 units,> 400 give 12 units and call M.D.   insulin glargine (LANTUS) 100 UNIT/ML injection    Sig: ADMINISTER 25 UNITS UNDER THE SKIN DAILY    Dispense:  30 mL    Refill:  6    Follow-up: Return in about 1 month (around 09/30/2022) for Diabetes follow-up.       Hoy Register, MD, FAAFP. [facility] and [facility]ness [place], [place] [phone]   08/30/2022, 10:08 AM

## 2022-08-30 NOTE — Patient Instructions (Signed)
Sent Referral to [facility] [place]   Phone # [phone] Fax # [phone] [address] [place], [place] [address]

## 2022-08-30 NOTE — Progress Notes (Signed)
Abdominal concerns Discuss Gabapentin dosage.

## 2022-09-14 ENCOUNTER — Ambulatory Visit: Payer: Medicaid Other | Admitting: Critical Care Medicine

## 2022-09-29 ENCOUNTER — Observation Stay (HOSPITAL_COMMUNITY)
Admission: EM | Admit: 2022-09-29 | Discharge: 2022-09-30 | Disposition: A | Discharge status: Home / Self Care | Payer: Medicaid Other | Attending: Internal Medicine | Admitting: Internal Medicine

## 2022-09-29 ENCOUNTER — Emergency Department (HOSPITAL_COMMUNITY): Payer: Medicaid Other

## 2022-09-29 ENCOUNTER — Encounter (HOSPITAL_COMMUNITY): Payer: Self-pay

## 2022-09-29 DIAGNOSIS — R112 Nausea with vomiting, unspecified: Principal | ICD-10-CM | POA: Diagnosis present

## 2022-09-29 DIAGNOSIS — Z794 Long term (current) use of insulin: Secondary | ICD-10-CM | POA: Insufficient documentation

## 2022-09-29 DIAGNOSIS — U071 COVID-19: Secondary | ICD-10-CM | POA: Diagnosis not present

## 2022-09-29 DIAGNOSIS — E109 Type 1 diabetes mellitus without complications: Secondary | ICD-10-CM | POA: Diagnosis present

## 2022-09-29 DIAGNOSIS — Z79899 Other long term (current) drug therapy: Secondary | ICD-10-CM | POA: Diagnosis not present

## 2022-09-29 DIAGNOSIS — R6510 Systemic inflammatory response syndrome (SIRS) of non-infectious origin without acute organ dysfunction: Secondary | ICD-10-CM | POA: Diagnosis present

## 2022-09-29 DIAGNOSIS — Z87891 Personal history of nicotine dependence: Secondary | ICD-10-CM | POA: Diagnosis not present

## 2022-09-29 DIAGNOSIS — E1069 Type 1 diabetes mellitus with other specified complication: Secondary | ICD-10-CM

## 2022-09-29 LAB — COMPREHENSIVE METABOLIC PANEL
ALT: 22 U/L (ref 0–44)
AST: 30 U/L (ref 15–41)
Albumin: 4.7 g/dL (ref 3.5–5.0)
Alkaline Phosphatase: 67 U/L (ref 38–126)
Anion gap: 15 (ref 5–15)
BUN: 15 mg/dL (ref 6–20)
CO2: 19 mmol/L — ABNORMAL LOW (ref 22–32)
Calcium: 9.8 mg/dL (ref 8.9–10.3)
Chloride: 99 mmol/L (ref 98–111)
Creatinine, Ser: 0.95 mg/dL (ref 0.61–1.24)
GFR, Estimated: >60 mL/min (ref >60)
Glucose, Bld: 232 mg/dL — ABNORMAL HIGH (ref 70–99)
Potassium: 3.9 mmol/L (ref 3.5–5.1)
Sodium: 133 mmol/L — ABNORMAL LOW (ref 135–145)
Total Bilirubin: 1.5 mg/dL — ABNORMAL HIGH (ref 0.3–1.2)
Total Protein: 7.8 g/dL (ref 6.5–8.1)

## 2022-09-29 LAB — CBC WITH DIFFERENTIAL/PLATELET
Abs Immature Granulocytes: 0.11 10*3/uL — ABNORMAL HIGH (ref 0.00–0.07)
Basophils Absolute: 0.1 10*3/uL (ref 0.0–0.1)
Basophils Relative: 0 %
Eosinophils Absolute: 0 10*3/uL (ref 0.0–0.5)
Eosinophils Relative: 0 %
HCT: 42.9 % (ref 39.0–52.0)
Hemoglobin: 14.7 g/dL (ref 13.0–17.0)
Immature Granulocytes: 1 %
Lymphocytes Relative: 5 %
Lymphs Abs: 1.1 10*3/uL (ref 0.7–4.0)
MCH: 30.1 pg (ref 26.0–34.0)
MCHC: 34.3 g/dL (ref 30.0–36.0)
MCV: 87.9 fL (ref 80.0–100.0)
Monocytes Absolute: 0.7 10*3/uL (ref 0.1–1.0)
Monocytes Relative: 3 %
Neutro Abs: 21.8 10*3/uL — ABNORMAL HIGH (ref 1.7–7.7)
Neutrophils Relative %: 91 %
Platelets: 272 10*3/uL (ref 150–400)
RBC: 4.88 MIL/uL (ref 4.22–5.81)
RDW: 11.9 % (ref 11.5–15.5)
WBC: 23.8 10*3/uL — ABNORMAL HIGH (ref 4.0–10.5)
nRBC: 0 % (ref 0.0–0.2)

## 2022-09-29 LAB — URINALYSIS, ROUTINE W REFLEX MICROSCOPIC
Bilirubin Urine: NEGATIVE
Glucose, UA: 150 mg/dL — AB
Hgb urine dipstick: NEGATIVE
Ketones, ur: 80 mg/dL — AB
Leukocytes,Ua: NEGATIVE
Nitrite: NEGATIVE
Protein, ur: NEGATIVE mg/dL
Specific Gravity, Urine: >1.046 — ABNORMAL HIGH (ref 1.005–1.030)
pH: 5 (ref 5.0–8.0)

## 2022-09-29 LAB — RAPID URINE DRUG SCREEN, HOSP PERFORMED
Amphetamines: NOT DETECTED
Barbiturates: NOT DETECTED
Benzodiazepines: NOT DETECTED
Cocaine: NOT DETECTED
Opiates: NOT DETECTED
Tetrahydrocannabinol: POSITIVE — AB

## 2022-09-29 LAB — CBG MONITORING, ED
Glucose-Capillary: 222 mg/dL — ABNORMAL HIGH (ref 70–99)
Glucose-Capillary: 107 mg/dL — ABNORMAL HIGH (ref 70–99)
Glucose-Capillary: 146 mg/dL — ABNORMAL HIGH (ref 70–99)

## 2022-09-29 LAB — LIPASE, BLOOD: Lipase: 22 U/L (ref 11–51)

## 2022-09-29 LAB — ETHANOL: Alcohol, Ethyl (B): <10 mg/dL (ref <10)

## 2022-09-29 MED ORDER — INSULIN ASPART 100 UNIT/ML IJ SOLN
0.0000 [IU] | INTRAMUSCULAR | Status: DC
  Administered 2022-09-29: 1 [IU] via SUBCUTANEOUS
  Administered 2022-09-30: 5 [IU] via SUBCUTANEOUS
  Administered 2022-09-30 (×4): 2 [IU] via SUBCUTANEOUS

## 2022-09-29 MED ORDER — SODIUM CHLORIDE 0.9 % IV SOLN
12.5000 mg | Freq: Four times a day (QID) | INTRAVENOUS | Status: DC | PRN
  Administered 2022-09-30: 12.5 mg via INTRAVENOUS
  Filled 2022-09-29: qty 12.5

## 2022-09-29 MED ORDER — DICYCLOMINE HCL 10 MG PO CAPS
20.0000 mg | ORAL_CAPSULE | Freq: Once | ORAL | Status: AC
  Administered 2022-09-29: 20 mg via ORAL
  Filled 2022-09-29: qty 2

## 2022-09-29 MED ORDER — ACETAMINOPHEN 325 MG PO TABS: 650.0000 mg | ORAL_TABLET | Freq: Four times a day (QID) | ORAL | Status: DC | PRN

## 2022-09-29 MED ORDER — ACETAMINOPHEN 650 MG RE SUPP: 650.0000 mg | Freq: Four times a day (QID) | RECTAL | Status: DC | PRN

## 2022-09-29 MED ORDER — DIPHENHYDRAMINE HCL 25 MG PO CAPS
25.0000 mg | ORAL_CAPSULE | Freq: Once | ORAL | Status: AC
  Administered 2022-09-29: 25 mg via ORAL
  Filled 2022-09-29: qty 1

## 2022-09-29 MED ORDER — HYDROCODONE-ACETAMINOPHEN 5-325 MG PO TABS: 1.0000 | ORAL_TABLET | ORAL | Status: DC | PRN

## 2022-09-29 MED ORDER — ONDANSETRON HCL 4 MG PO TABS
4.0000 mg | ORAL_TABLET | Freq: Four times a day (QID) | ORAL | Status: DC | PRN
  Administered 2022-09-29: 4 mg via ORAL
  Filled 2022-09-29: qty 1

## 2022-09-29 MED ORDER — IOHEXOL 350 MG/ML SOLN
75.0000 mL | Freq: Once | INTRAVENOUS | Status: AC | PRN
  Administered 2022-09-29: 75 mL via INTRAVENOUS

## 2022-09-29 MED ORDER — KETOROLAC TROMETHAMINE 30 MG/ML IJ SOLN: 30.0000 mg | Freq: Four times a day (QID) | INTRAMUSCULAR | Status: DC | PRN

## 2022-09-29 MED ORDER — METOCLOPRAMIDE HCL 5 MG/ML IJ SOLN
10.0000 mg | Freq: Once | INTRAMUSCULAR | Status: AC
  Administered 2022-09-29: 10 mg via INTRAVENOUS
  Filled 2022-09-29: qty 2

## 2022-09-29 MED ORDER — SODIUM CHLORIDE 0.9 % IV BOLUS
1000.0000 mL | Freq: Once | INTRAVENOUS | Status: AC
  Administered 2022-09-29: 1000 mL via INTRAVENOUS

## 2022-09-29 MED ORDER — INSULIN DETEMIR 100 UNIT/ML ~~LOC~~ SOLN
10.0000 [IU] | Freq: Every day | SUBCUTANEOUS | Status: DC
  Administered 2022-09-29: 10 [IU] via SUBCUTANEOUS
  Filled 2022-09-29 (×3): qty 0.1

## 2022-09-29 MED ORDER — LORAZEPAM 2 MG/ML IJ SOLN
1.0000 mg | Freq: Once | INTRAMUSCULAR | Status: AC
  Administered 2022-09-29: 1 mg via INTRAVENOUS
  Filled 2022-09-29: qty 1

## 2022-09-29 MED ORDER — INSULIN ASPART 100 UNIT/ML IJ SOLN
5.0000 [IU] | Freq: Once | INTRAMUSCULAR | Status: AC
  Administered 2022-09-29: 5 [IU] via SUBCUTANEOUS

## 2022-09-29 MED ORDER — SODIUM CHLORIDE 0.9% FLUSH
3.0000 mL | Freq: Two times a day (BID) | INTRAVENOUS | Status: DC
  Administered 2022-09-30: 3 mL via INTRAVENOUS

## 2022-09-29 MED ORDER — ONDANSETRON HCL 4 MG/2ML IJ SOLN: 4.0000 mg | Freq: Four times a day (QID) | INTRAMUSCULAR | Status: DC | PRN

## 2022-09-29 MED ORDER — LACTATED RINGERS IV BOLUS
1000.0000 mL | Freq: Once | INTRAVENOUS | Status: AC
  Administered 2022-09-29: 1000 mL via INTRAVENOUS

## 2022-09-29 MED ORDER — LACTATED RINGERS IV SOLN: INTRAVENOUS | Status: AC

## 2022-09-29 MED ORDER — ONDANSETRON HCL 4 MG/2ML IJ SOLN
4.0000 mg | Freq: Once | INTRAMUSCULAR | Status: AC
  Administered 2022-09-29: 4 mg via INTRAVENOUS
  Filled 2022-09-29: qty 2

## 2022-09-29 MED ORDER — HALOPERIDOL LACTATE 5 MG/ML IJ SOLN
5.0000 mg | Freq: Once | INTRAMUSCULAR | Status: AC
  Administered 2022-09-29: 5 mg via INTRAVENOUS
  Filled 2022-09-29: qty 1

## 2022-09-29 MED ORDER — ENOXAPARIN SODIUM 40 MG/0.4ML IJ SOSY
40.0000 mg | PREFILLED_SYRINGE | INTRAMUSCULAR | Status: DC
  Administered 2022-09-29: 40 mg via SUBCUTANEOUS
  Filled 2022-09-29: qty 0.4

## 2022-09-29 MED ORDER — FAMOTIDINE IN NACL 20-0.9 MG/50ML-% IV SOLN
20.0000 mg | Freq: Once | INTRAVENOUS | Status: AC
  Administered 2022-09-29: 20 mg via INTRAVENOUS
  Filled 2022-09-29: qty 50

## 2022-09-29 NOTE — ED Notes (Signed)
ED TO INPATIENT HANDOFF REPORT  ED Nurse Name and Phone #: Tiffany  S Name/Age/Gender Shawn Maldonado Shawn Maldonado 37 y.o. male Room/Bed: 031C/031C  Code Status   Code Status: Full Code  Home/SNF/Other Home Patient oriented to: self, place, time, and situation Is this baseline? Yes   Triage Complete: Triage complete  Chief Complaint Intractable nausea and vomiting [R11.2]  Triage Note Pt bib ems from home; c/o n/v/d and generalized abd pain since this am; pt cool and diaphoretic to touch; cbg 286; hx dm; denies fevers; 135/85, ; sinus arrhyhtmia noted with ems, hr 60-110, no cardiac hx, sats 100% RA; 4 mg Zofran and 30 mls IVF given pta; pt denies sick contacts; marijuana use yesterday   Allergies No Known Allergies  Level of Care/Admitting Diagnosis ED Disposition     ED Disposition  Admit   Condition  --   Comment  Hospital Area: [facility] [[id number]]  Level of Care: Telemetry Medical [104]  May place patient in observation at [facility] or [facility] if equivalent level of care is available:: Yes  Covid Evaluation: Asymptomatic - no recent exposure (last 10 days) testing not required  Diagnosis: Intractable nausea and vomiting [720114]  Admitting Physician: Briscoe Deutscher [[id number]]  Attending Physician: Briscoe Deutscher [[id number]]          B Medical/Surgery History Past Medical History:  Diagnosis Date   Diabetes mellitus without complication (HCC)    Past Surgical History:  Procedure Laterality Date   MOUTH SURGERY       A IV Location/Drains/Wounds Patient Lines/Drains/Airways Status     Active Line/Drains/Airways     Name Placement date Placement time Site Days   Peripheral IV 09/29/22 22 G Anterior;Left;Proximal Forearm 09/29/22  --  Forearm  less than 1   Peripheral IV 09/29/22 20 G Right Antecubital 09/29/22  1405  Antecubital  less than 1            Intake/Output Last 24 hours No intake or output data in the 24 hours  ending 09/29/22 2024  Labs/Imaging Results for orders placed or performed during the hospital encounter of 09/29/22 (from the past 48 hour(s))  CBC with Differential     Status: Abnormal   Collection Time: 09/29/22  1:58 PM  Result Value Ref Range   WBC 23.8 (H) 4.0 - 10.5 K/uL   RBC 4.88 4.22 - 5.81 MIL/uL   Hemoglobin 14.7 13.0 - 17.0 g/dL   HCT 19.1 47.8 - 29.5 %   MCV 87.9 80.0 - 100.0 fL   MCH 30.1 26.0 - 34.0 pg   MCHC 34.3 30.0 - 36.0 g/dL   RDW 62.1 30.8 - 65.7 %   Platelets 272 150 - 400 K/uL   nRBC 0.0 0.0 - 0.2 %   Neutrophils Relative % 91 %   Neutro Abs 21.8 (H) 1.7 - 7.7 K/uL   Lymphocytes Relative 5 %   Lymphs Abs 1.1 0.7 - 4.0 K/uL   Monocytes Relative 3 %   Monocytes Absolute 0.7 0.1 - 1.0 K/uL   Eosinophils Relative 0 %   Eosinophils Absolute 0.0 0.0 - 0.5 K/uL   Basophils Relative 0 %   Basophils Absolute 0.1 0.0 - 0.1 K/uL   Immature Granulocytes 1 %   Abs Immature Granulocytes 0.11 (H) 0.00 - 0.07 K/uL    Comment: Performed at [facility], [address] [place], [place] [address]  Comprehensive metabolic panel     Status: Abnormal   Collection  Time: 09/29/22  1:58 PM  Result Value Ref Range   Sodium 133 (L) 135 - 145 mmol/L   Potassium 3.9 3.5 - 5.1 mmol/L   Chloride 99 98 - 111 mmol/L   CO2 19 (L) 22 - 32 mmol/L   Glucose, Bld 232 (H) 70 - 99 mg/dL    Comment: Glucose reference range applies only to samples taken after fasting for at least 8 hours.   BUN 15 6 - 20 mg/dL   Creatinine, Ser 1.47 0.61 - 1.24 mg/dL   Calcium 9.8 8.9 - 82.9 mg/dL   Total Protein 7.8 6.5 - 8.1 g/dL   Albumin 4.7 3.5 - 5.0 g/dL   AST 30 15 - 41 U/L   ALT 22 0 - 44 U/L   Alkaline Phosphatase 67 38 - 126 U/L   Total Bilirubin 1.5 (H) 0.3 - 1.2 mg/dL   GFR, Estimated >56 >21 mL/min    Comment: (NOTE) Calculated using the CKD-EPI Creatinine Equation (2021)    Anion gap 15 5 - 15    Comment: Performed at [facility], [address], [place], [place]  [address]  Lipase, blood     Status: None   Collection Time: 09/29/22  1:58 PM  Result Value Ref Range   Lipase 22 11 - 51 U/L    Comment: Performed at [facility], [address], [place], [place] [address]  Ethanol     Status: None   Collection Time: 09/29/22  1:58 PM  Result Value Ref Range   Alcohol, Ethyl (B) <10 <10 mg/dL    Comment: (NOTE) Lowest detectable limit for serum alcohol is 10 mg/dL.  For medical purposes only. Performed at [facility], [address] [place], [place] [address]   CBG monitoring, ED     Status: Abnormal   Collection Time: 09/29/22  2:01 PM  Result Value Ref Range   Glucose-Capillary 222 (H) 70 - 99 mg/dL    Comment: Glucose reference range applies only to samples taken after fasting for at least 8 hours.  Urinalysis, Routine w reflex microscopic -Urine, Clean Catch     Status: Abnormal   Collection Time: 09/29/22  5:48 PM  Result Value Ref Range   Color, Urine YELLOW YELLOW   APPearance CLEAR CLEAR   Specific Gravity, Urine >1.046 (H) 1.005 - 1.030   pH 5.0 5.0 - 8.0   Glucose, UA 150 (A) NEGATIVE mg/dL   Hgb urine dipstick NEGATIVE NEGATIVE   Bilirubin Urine NEGATIVE NEGATIVE   Ketones, ur 80 (A) NEGATIVE mg/dL   Protein, ur NEGATIVE NEGATIVE mg/dL   Nitrite NEGATIVE NEGATIVE   Leukocytes,Ua NEGATIVE NEGATIVE    Comment: Performed at [facility], [address], [place], [place] [address]  Rapid urine drug screen (hospital performed)     Status: Abnormal   Collection Time: 09/29/22  5:48 PM  Result Value Ref Range   Opiates NONE DETECTED NONE DETECTED   Cocaine NONE DETECTED NONE DETECTED   Benzodiazepines NONE DETECTED NONE DETECTED   Amphetamines NONE DETECTED NONE DETECTED   Tetrahydrocannabinol POSITIVE (A) NONE DETECTED   Barbiturates NONE DETECTED NONE DETECTED    Comment: (NOTE) DRUG SCREEN FOR MEDICAL PURPOSES ONLY.  IF CONFIRMATION IS NEEDED FOR ANY PURPOSE, NOTIFY LAB WITHIN 5 DAYS.  LOWEST DETECTABLE  LIMITS FOR URINE DRUG SCREEN Drug Class                     Cutoff (ng/mL)  Amphetamine and metabolites    1000 Barbiturate and metabolites    200 Benzodiazepine                 200 Opiates and metabolites        300 Cocaine and metabolites        300 THC                            50 Performed at [facility], [address], [place], [place] [address]   POC CBG, ED     Status: Abnormal   Collection Time: 09/29/22  6:59 PM  Result Value Ref Range   Glucose-Capillary 107 (H) 70 - 99 mg/dL    Comment: Glucose reference range applies only to samples taken after fasting for at least 8 hours.  CBG monitoring, ED     Status: Abnormal   Collection Time: 09/29/22  8:21 PM  Result Value Ref Range   Glucose-Capillary 146 (H) 70 - 99 mg/dL    Comment: Glucose reference range applies only to samples taken after fasting for at least 8 hours.   CT ABDOMEN PELVIS W CONTRAST  Result Date: 09/29/2022 CLINICAL DATA:  Epigastric pain. EXAM: CT ABDOMEN AND PELVIS WITH CONTRAST TECHNIQUE: Multidetector CT imaging of the abdomen and pelvis was performed using the standard protocol following bolus administration of intravenous contrast. RADIATION DOSE REDUCTION: This exam was performed according to the departmental dose-optimization program which includes automated exposure control, adjustment of the mA and/or kV according to patient size and/or use of iterative reconstruction technique. CONTRAST:  75mL OMNIPAQUE IOHEXOL 350 MG/ML SOLN COMPARISON:  CT examination dated July 31, 2021 FINDINGS: Lower chest: No acute abnormality. Hepatobiliary: No focal liver abnormality is seen. No gallstones, gallbladder wall thickening, or biliary dilatation. Pancreas: Unremarkable. No pancreatic ductal dilatation or surrounding inflammatory changes. Spleen: Normal in size without focal abnormality. Adrenals/Urinary Tract: Adrenal glands are unremarkable. Kidneys are normal, without renal calculi, focal lesion, or  hydronephrosis. Mild generalized thickening of the urinary bladder wall. Stomach/Bowel: Stomach is within normal limits. Appendix not identified, however no inflammatory changes in the pericecal region. No evidence of bowel wall thickening, distention, or inflammatory changes. Vascular/Lymphatic: No significant vascular findings are present. No enlarged abdominal or pelvic lymph nodes. Reproductive: Prostate is unremarkable. Other: No abdominal wall hernia or abnormality. No abdominopelvic ascites. Musculoskeletal: No acute or significant osseous findings. IMPRESSION: 1. Mild generalized thickening of the urinary bladder wall, which may represent cystitis. Correlation with urinalysis is suggested. 2. No evidence of nephrolithiasis or hydronephrosis. 3. No evidence of acute abnormality within the abdomen or pelvis. Electronically Signed   By: Larose Hires D.O.   On: 09/29/2022 17:32    Pending Labs Unresulted Labs (From admission, onward)     Start     Ordered   10/06/22 0500  Creatinine, serum  (enoxaparin (LOVENOX)    CrCl >/= 30 ml/min)  Weekly,   R     Comments: while on enoxaparin therapy    09/29/22 2013   09/30/22 0500  HIV Antibody (routine testing w rflx)  (HIV Antibody (Routine testing w reflex) panel)  Tomorrow morning,   R        09/29/22 2013   09/30/22 0500  Comprehensive metabolic panel  Tomorrow morning,   R        09/29/22 2013   09/30/22 0500  Magnesium  Tomorrow morning,   R  09/29/22 2013   09/30/22 0500  CBC  Tomorrow morning,   R        09/29/22 2013            Vitals/Pain Today's Vitals   09/29/22 1809 09/29/22 1930 09/29/22 1933 09/29/22 1934  BP:  (!) 165/100    Pulse:  (!) 112 (!) 110   Resp:  (!) 23 16   Temp: 98 F (36.7 C)     TempSrc: Oral     SpO2:  100% 97%   Weight:      Height:      PainSc:    Asleep    Isolation Precautions No active isolations  Medications Medications  insulin detemir (LEVEMIR) injection 10 Units (has no  administration in time range)  insulin aspart (novoLOG) injection 0-9 Units (has no administration in time range)  enoxaparin (LOVENOX) injection 40 mg (has no administration in time range)  sodium chloride flush (NS) 0.9 % injection 3 mL (has no administration in time range)  acetaminophen (TYLENOL) tablet 650 mg (has no administration in time range)    Or  acetaminophen (TYLENOL) suppository 650 mg (has no administration in time range)  lactated ringers infusion (has no administration in time range)  ketorolac (TORADOL) 30 MG/ML injection 30 mg (has no administration in time range)  HYDROcodone-acetaminophen (NORCO/VICODIN) 5-325 MG per tablet 1-2 tablet (has no administration in time range)  ondansetron (ZOFRAN) tablet 4 mg (has no administration in time range)    Or  ondansetron (ZOFRAN) injection 4 mg (has no administration in time range)  promethazine (PHENERGAN) 12.5 mg in sodium chloride 0.9 % 50 mL IVPB (has no administration in time range)  sodium chloride 0.9 % bolus 1,000 mL (0 mLs Intravenous Stopped 09/29/22 1604)  ondansetron (ZOFRAN) injection 4 mg (4 mg Intravenous Given 09/29/22 1429)  haloperidol lactate (HALDOL) injection 5 mg (5 mg Intravenous Given 09/29/22 1429)  LORazepam (ATIVAN) injection 1 mg (1 mg Intravenous Given 09/29/22 1626)  insulin aspart (novoLOG) injection 5 Units (5 Units Subcutaneous Given 09/29/22 1625)  lactated ringers bolus 1,000 mL (0 mLs Intravenous Stopped 09/29/22 1846)  famotidine (PEPCID) IVPB 20 mg premix (0 mg Intravenous Stopped 09/29/22 1720)  metoCLOPramide (REGLAN) injection 10 mg (10 mg Intravenous Given 09/29/22 1626)  diphenhydrAMINE (BENADRYL) capsule 25 mg (25 mg Oral Given 09/29/22 1626)  dicyclomine (BENTYL) capsule 20 mg (20 mg Oral Given 09/29/22 1626)  iohexol (OMNIPAQUE) 350 MG/ML injection 75 mL (75 mLs Intravenous Contrast Given 09/29/22 1650)    Mobility walks     Focused Assessments    R Recommendations: See Admitting  Provider Note  Report given to:   Additional Notes: Call for questions

## 2022-09-29 NOTE — H&P (Signed)
History and Physical    Shawn Maldonado:[id number] DOB: 1985-06-06 DOA: 09/29/2022  PCP: Hoy Register, MD   Patient coming from: Home   Chief Complaint: N/V/D   HPI: Shawn Maldonado is a 37 y.o. male with medical history significant for type 1 diabetes mellitus who presents to the emergency department with nausea, vomiting, and diarrhea.  Patient reports that he has been suffering with nausea, recurrent episodes of nonbloody vomiting, and diarrhea since this morning.  He has had roughly 5 loose stools today.  He was having some generalized abdominal pain earlier, but denies that now.  He denies any dysuria, flank pain, chest pain, cough, or shortness of breath.  He reports that the symptoms are very similar to what he was experiencing 6 weeks ago when he was seen in the ED, treated, and discharged.  He had completely recovered from that illness prior to onset of current symptoms.  ED Course: Upon arrival to the ED, patient is found to be afebrile and saturating well on room air with elevated heart rate and stable blood pressure.  EKG demonstrates sinus arrhythmia.  CT of the abdomen pelvis is notable for mild urinary bladder wall thickening.  Labs are most notable for WBC 23,800, glucose 232, ketonuria, and normal lipase.  He was treated in the emergency department with 2 L of IV fluids, Benadryl, Bentyl, Pepcid, Reglan, Zofran, Haldol, Ativan, and NovoLog.  Review of Systems:  All other systems reviewed and apart from HPI, are negative.  Past Medical History:  Diagnosis Date   Diabetes mellitus without complication (HCC)     Past Surgical History:  Procedure Laterality Date   MOUTH SURGERY      Social History:   reports that he has quit smoking. His smoking use included cigarettes. He has never used smokeless tobacco. He reports current alcohol use. He reports current drug use. Drug: Marijuana.  No Known Allergies  Family History  Problem Relation Age of Onset    Stroke Father    CAD Other      Prior to Admission medications   Medication Sig Start Date End Date Taking? Authorizing Provider  amoxicillin (AMOXIL) 500 MG capsule Take 1 capsule (500 mg total) by mouth 3 (three) times daily. Patient not taking: Reported on 05/31/2022 01/04/21   Koleen Distance, MD  Continuous Blood Gluc Receiver (DEXCOM G7 RECEIVER) DEVI Check blood sugar three times daily. E10.69 05/31/22   Hoy Register, MD  Continuous Blood Gluc Sensor (DEXCOM G7 SENSOR) MISC Check blood sugar three times daily. Change sensors once every 10 days. E10.69 05/31/22   Hoy Register, MD  dicyclomine (BENTYL) 20 MG tablet Take 1 tablet (20 mg total) by mouth 2 (two) times daily. 08/15/22   Elayne Snare K, DO  famotidine (PEPCID) 20 MG tablet Take 1 tablet (20 mg total) by mouth 2 (two) times daily. 08/15/22   Elayne Snare K, DO  gabapentin (NEURONTIN) 300 MG capsule Take 1 capsule (300 mg total) by mouth at bedtime. 05/31/22   Hoy Register, MD  insulin aspart (NOVOLOG) 100 UNIT/ML injection Inject 0-12 Units into the skin 3 (three) times daily before meals. Per sliding scale 08/30/22   Hoy Register, MD  insulin glargine (LANTUS) 100 UNIT/ML injection ADMINISTER 25 UNITS UNDER THE SKIN DAILY 08/30/22   Hoy Register, MD  omeprazole (PRILOSEC) 40 MG capsule Take 1 capsule (40 mg total) by mouth daily. 08/30/22   Hoy Register, MD  ondansetron (ZOFRAN) 4 MG tablet Take 1 tablet (4 mg  total) by mouth every 6 (six) hours. Patient not taking: Reported on 08/30/2022 08/15/22   Elayne Snare K, DO  prochlorperazine (COMPAZINE) 10 MG tablet Take 1 tablet (10 mg total) by mouth every 6 (six) hours as needed for nausea or vomiting. Patient not taking: Reported on 08/30/2022 07/31/21   Mancel Bale, MD    Physical Exam: Vitals:   09/29/22 1630 09/29/22 1809 09/29/22 1930 09/29/22 1933  BP: 137/73  (!) 165/100   Pulse: 78  (!) 112 (!) 110  Resp: (!) 21  (!) 23 16  Temp:  98 F (36.7 C)     TempSrc:  Oral    SpO2: 100%  100% 97%  Weight:      Height:        Constitutional: NAD, no pallor or diaphoresis  Eyes: PERTLA, lids and conjunctivae normal ENMT: Mucous membranes are dry. Posterior pharynx clear of any exudate or lesions.   Neck: supple, no masses  Respiratory: no wheezing, no crackles. No accessory muscle use.  Cardiovascular: S1 & S2 heard, regular rate and rhythm. No extremity edema.   Abdomen: No distension, no tenderness, soft. Bowel sounds active.  Musculoskeletal: no clubbing / cyanosis. No joint deformity upper and lower extremities.   Skin: no significant rashes, lesions, ulcers. Warm, dry, well-perfused. Neurologic: CN 2-12 grossly intact. Moving all extremities. Somnolent. Wakes to voice and is oriented.  Psychiatric: Calm. Cooperative.    Labs and Imaging on Admission: I have personally reviewed following labs and imaging studies  CBC: Recent Labs  Lab 09/29/22 1358  WBC 23.8*  NEUTROABS 21.8*  HGB 14.7  HCT 42.9  MCV 87.9  PLT 272   Basic Metabolic Panel: Recent Labs  Lab 09/29/22 1358  NA 133*  K 3.9  CL 99  CO2 19*  GLUCOSE 232*  BUN 15  CREATININE 0.95  CALCIUM 9.8   GFR: Estimated Creatinine Clearance: 92.2 mL/min (by C-G formula based on SCr of 0.95 mg/dL). Liver Function Tests: Recent Labs  Lab 09/29/22 1358  AST 30  ALT 22  ALKPHOS 67  BILITOT 1.5*  PROT 7.8  ALBUMIN 4.7   Recent Labs  Lab 09/29/22 1358  LIPASE 22   No results for input(s): "AMMONIA" in the last 168 hours. Coagulation Profile: No results for input(s): "INR", "PROTIME" in the last 168 hours. Cardiac Enzymes: No results for input(s): "CKTOTAL", "CKMB", "CKMBINDEX", "TROPONINI" in the last 168 hours. BNP (last 3 results) No results for input(s): "PROBNP" in the last 8760 hours. HbA1C: No results for input(s): "HGBA1C" in the last 72 hours. CBG: Recent Labs  Lab 09/29/22 1401 09/29/22 1859 09/29/22 2021  GLUCAP 222* 107* 146*    Lipid Profile: No results for input(s): "CHOL", "HDL", "LDLCALC", "TRIG", "CHOLHDL", "LDLDIRECT" in the last 72 hours. Thyroid Function Tests: No results for input(s): "TSH", "T4TOTAL", "FREET4", "T3FREE", "THYROIDAB" in the last 72 hours. Anemia Panel: No results for input(s): "VITAMINB12", "FOLATE", "FERRITIN", "TIBC", "IRON", "RETICCTPCT" in the last 72 hours. Urine analysis:    Component Value Date/Time   COLORURINE YELLOW 09/29/2022 1748   APPEARANCEUR CLEAR 09/29/2022 1748   LABSPEC >1.046 (H) 09/29/2022 1748   PHURINE 5.0 09/29/2022 1748   GLUCOSEU 150 (A) 09/29/2022 1748   HGBUR NEGATIVE 09/29/2022 1748   BILIRUBINUR NEGATIVE 09/29/2022 1748   KETONESUR 80 (A) 09/29/2022 1748   PROTEINUR NEGATIVE 09/29/2022 1748   UROBILINOGEN 0.2 09/24/2012 1037   NITRITE NEGATIVE 09/29/2022 1748   LEUKOCYTESUR NEGATIVE 09/29/2022 1748   Sepsis Labs: @LABRCNTIP (procalcitonin:4,lacticidven:4) )No results found  for this or any previous visit (from the past 240 hour(s)).   Radiological Exams on Admission: CT ABDOMEN PELVIS W CONTRAST  Result Date: 09/29/2022 CLINICAL DATA:  Epigastric pain. EXAM: CT ABDOMEN AND PELVIS WITH CONTRAST TECHNIQUE: Multidetector CT imaging of the abdomen and pelvis was performed using the standard protocol following bolus administration of intravenous contrast. RADIATION DOSE REDUCTION: This exam was performed according to the departmental dose-optimization program which includes automated exposure control, adjustment of the mA and/or kV according to patient size and/or use of iterative reconstruction technique. CONTRAST:  75mL OMNIPAQUE IOHEXOL 350 MG/ML SOLN COMPARISON:  CT examination dated July 31, 2021 FINDINGS: Lower chest: No acute abnormality. Hepatobiliary: No focal liver abnormality is seen. No gallstones, gallbladder wall thickening, or biliary dilatation. Pancreas: Unremarkable. No pancreatic ductal dilatation or surrounding inflammatory changes. Spleen:  Normal in size without focal abnormality. Adrenals/Urinary Tract: Adrenal glands are unremarkable. Kidneys are normal, without renal calculi, focal lesion, or hydronephrosis. Mild generalized thickening of the urinary bladder wall. Stomach/Bowel: Stomach is within normal limits. Appendix not identified, however no inflammatory changes in the pericecal region. No evidence of bowel wall thickening, distention, or inflammatory changes. Vascular/Lymphatic: No significant vascular findings are present. No enlarged abdominal or pelvic lymph nodes. Reproductive: Prostate is unremarkable. Other: No abdominal wall hernia or abnormality. No abdominopelvic ascites. Musculoskeletal: No acute or significant osseous findings. IMPRESSION: 1. Mild generalized thickening of the urinary bladder wall, which may represent cystitis. Correlation with urinalysis is suggested. 2. No evidence of nephrolithiasis or hydronephrosis. 3. No evidence of acute abnormality within the abdomen or pelvis. Electronically Signed   By: Larose Hires D.O.   On: 09/29/2022 17:32    EKG: Independently reviewed. Sinus arrhythmia.   Assessment/Plan   1. Intractable N/V  - Exam is benign, no urinary symptoms - Also reports 5 loose stools in the last 24 hrs  - Continue IVF hydration and as-needed antiemetics, check C diff and GI pathogen panel if diarrhea recurs, monitor electrolytes and volume status, trial clears and advance diet as he improves    2. Type I DM  - A1c was 10.5% on 08/30/22  - Continue CBG checks and long- and short-acting insulin   3. SIRS  - No fever but multiple other SIRS criteria present on admission  - He may have a gastroenteritis but there does not appear to be evidence for serious bacterial infection at this time  - Monitor off of antibiotics, culture and consider empiric antibiotics if fever develops, repeat CBC in am    DVT prophylaxis: Lovenox  Code Status: Full  Level of Care: Level of care: Telemetry  Medical Family Communication: None present  Disposition Plan:  Patient is from: home  Anticipated d/c is to: home  Anticipated d/c date is: 6/1 or 10/01/22  Patient currently: Pending tolerance of adequate oral intake  Consults called: None  Admission status: Observation     Briscoe Deutscher, MD [facility]  09/29/2022, 8:29 PM

## 2022-09-29 NOTE — ED Provider Notes (Signed)
Accepted handoff at shift change from BT PA-C. Please see prior provider note for more detail.   Briefly: Patient is 37 y.o.           Physical Exam  BP (!) 165/100   Pulse (!) 110   Temp 98 F (36.7 C) (Oral)   Resp 16   Ht 5\' 9"  (1.753 m)   Wt 61.2 kg   SpO2 97%   BMI 19.94 kg/m   Physical Exam  Procedures  Procedures  ED Course / MDM    Medical Decision Making Amount and/or Complexity of Data Reviewed Labs: ordered. Radiology: ordered.  Risk Prescription drug management. Decision regarding hospitalization.   Patient will require admission as he has had multiple antiemetics and is still vomiting.  On reassessment patient does seem to be no longer retching. Will PO chal.  He did fail p.o. challenge and is tachycardic and dehydrated appearing.  Will discuss with hospitalist service.  Patient admitted to hospital service by Dr. Anne Ng, Rodrigo Ran, PA 09/29/22 2018    Jacalyn Lefevre, MD 09/29/22 2218

## 2022-09-29 NOTE — ED Notes (Signed)
Advised pt we need a urine sample, given a urinal.

## 2022-09-29 NOTE — ED Provider Notes (Signed)
[place] EMERGENCY DEPARTMENT AT [facility] Provider Note   CSN: [id number] Arrival date & time: 09/29/22  1346     History  Chief Complaint  Patient presents with   Abdominal Pain   Emesis    Shawn Maldonado is a 37 y.o. male.  The history is provided by the patient and medical records. No language interpreter was used.  Abdominal Pain Associated symptoms: vomiting   Emesis Associated symptoms: abdominal pain      37 year old male significant history of type 1 diabetes, recurrent DKA, peripheral neuropathy, brought here via EMS from home with complaints of abdominal pain.  Patient reported cute onset of diffuse abdominal cramping with associate nausea and vomiting that started at 12 PM last night.  Symptoms moderate in severity and persistent.  Vomitus is nonbloody nonbilious.  Patient endorsed chills and shakiness.  Does not endorse any fever, runny nose sneezing or coughing chest pain or shortness of breath or urinary symptoms.  Last marijuana use was last night.  No prior abdominal surgeries.  Does endorse some loose stools.  Patient did receive Zofran and some IV fluid by EMS prior to arrival.  Home Medications Prior to Admission medications   Medication Sig Start Date End Date Taking? Authorizing Provider  amoxicillin (AMOXIL) 500 MG capsule Take 1 capsule (500 mg total) by mouth 3 (three) times daily. Patient not taking: Reported on 05/31/2022 01/04/21   Koleen Distance, MD  Continuous Blood Gluc Receiver (DEXCOM G7 RECEIVER) DEVI Check blood sugar three times daily. E10.69 05/31/22   Hoy Register, MD  Continuous Blood Gluc Sensor (DEXCOM G7 SENSOR) MISC Check blood sugar three times daily. Change sensors once every 10 days. E10.69 05/31/22   Hoy Register, MD  dicyclomine (BENTYL) 20 MG tablet Take 1 tablet (20 mg total) by mouth 2 (two) times daily. 08/15/22   Elayne Snare K, DO  famotidine (PEPCID) 20 MG tablet Take 1 tablet (20 mg total) by mouth 2  (two) times daily. 08/15/22   Elayne Snare K, DO  gabapentin (NEURONTIN) 300 MG capsule Take 1 capsule (300 mg total) by mouth at bedtime. 05/31/22   Hoy Register, MD  insulin aspart (NOVOLOG) 100 UNIT/ML injection Inject 0-12 Units into the skin 3 (three) times daily before meals. Per sliding scale 08/30/22   Hoy Register, MD  insulin glargine (LANTUS) 100 UNIT/ML injection ADMINISTER 25 UNITS UNDER THE SKIN DAILY 08/30/22   Hoy Register, MD  omeprazole (PRILOSEC) 40 MG capsule Take 1 capsule (40 mg total) by mouth daily. 08/30/22   Hoy Register, MD  ondansetron (ZOFRAN) 4 MG tablet Take 1 tablet (4 mg total) by mouth every 6 (six) hours. Patient not taking: Reported on 08/30/2022 08/15/22   Elayne Snare K, DO  prochlorperazine (COMPAZINE) 10 MG tablet Take 1 tablet (10 mg total) by mouth every 6 (six) hours as needed for nausea or vomiting. Patient not taking: Reported on 08/30/2022 07/31/21   Mancel Bale, MD      Allergies    Patient has no known allergies.    Review of Systems   Review of Systems  Gastrointestinal:  Positive for abdominal pain and vomiting.  All other systems reviewed and are negative.   Physical Exam Updated Vital Signs BP (!) 117/103   Pulse 66   Temp 97.9 F (36.6 C) (Oral)   Resp 16   Ht 5\' 9"  (1.753 m)   Wt 61.2 kg   SpO2 100%   BMI 19.94 kg/m  Physical Exam Vitals  and nursing note reviewed.  Constitutional:      Appearance: He is well-developed.     Comments: Appears uncomfortable, diaphoretic  HENT:     Head: Atraumatic.  Eyes:     Conjunctiva/sclera: Conjunctivae normal.  Cardiovascular:     Rate and Rhythm: Normal rate and regular rhythm.  Pulmonary:     Effort: Pulmonary effort is normal.     Breath sounds: Normal breath sounds.  Abdominal:     General: Bowel sounds are normal.     Palpations: Abdomen is soft.     Tenderness: There is generalized abdominal tenderness.  Musculoskeletal:     Cervical back: Neck supple.   Skin:    Findings: No rash.  Neurological:     Mental Status: He is alert.     ED Results / Procedures / Treatments   Labs (all labs ordered are listed, but only abnormal results are displayed) Labs Reviewed  CBC WITH DIFFERENTIAL/PLATELET - Abnormal; Notable for the following components:      Result Value   WBC 23.8 (*)    Neutro Abs 21.8 (*)    Abs Immature Granulocytes 0.11 (*)    All other components within normal limits  COMPREHENSIVE METABOLIC PANEL - Abnormal; Notable for the following components:   Sodium 133 (*)    CO2 19 (*)    Glucose, Bld 232 (*)    Total Bilirubin 1.5 (*)    All other components within normal limits  CBG MONITORING, ED - Abnormal; Notable for the following components:   Glucose-Capillary 222 (*)    All other components within normal limits  LIPASE, BLOOD  ETHANOL  URINALYSIS, ROUTINE W REFLEX MICROSCOPIC  RAPID URINE DRUG SCREEN, HOSP PERFORMED    EKG None  Radiology No results found.  Procedures Procedures    Medications Ordered in ED Medications  sodium chloride 0.9 % bolus 1,000 mL (1,000 mLs Intravenous New Bag/Given 09/29/22 1429)  ondansetron (ZOFRAN) injection 4 mg (4 mg Intravenous Given 09/29/22 1429)  haloperidol lactate (HALDOL) injection 5 mg (5 mg Intravenous Given 09/29/22 1429)    ED Course/ Medical Decision Making/ A&P Clinical Course as of 09/29/22 1552  Fri Sep 29, 2022  1525 Reassess [WF]    Clinical Course User Index [WF] Gailen Shelter, [place]                             Medical Decision Making Amount and/or Complexity of Data Reviewed Labs: ordered.  Risk Prescription drug management.   BP 137/83   Pulse (!) 58   Temp 97.9 F (36.6 C) (Oral)   Resp 10   Ht 5\' 9"  (1.753 m)   Wt 61.2 kg   SpO2 100%   BMI 19.94 kg/m   73:55 PM 37 year old male significant history of type 1 diabetes, recurrent DKA, peripheral neuropathy, brought here via EMS from home with complaints of abdominal pain.  Patient  reported cute onset of diffuse abdominal cramping with associate nausea and vomiting that started at 12 PM last night.  Symptoms moderate in severity and persistent.  Vomitus is nonbloody nonbilious.  Patient endorsed chills and shakiness.  Does not endorse any fever, runny nose sneezing or coughing chest pain or shortness of breath or urinary symptoms.  Last marijuana use was last night.  No prior abdominal surgeries.  Does endorse some loose stools.  Patient did receive Zofran and some IV fluid by EMS prior to arrival.  On exam, patient  appears uncomfortable, diaphoretic, laying in bed holding an emesis bag.  Heart with normal rate and rhythm, lungs clear to auscultation bilaterally abdomen is soft with diffuse tenderness but no focal point tenderness no guarding or rebound tenderness.  Negative Murphy sign, no pain at McBurney's point.  Vital signs overall reassuring.  Suspect cannabinol hyperemesis syndrome versus DKA.  Workup initiated.  Will give IV fluid, antiemetic, and Haldol.  -Labs ordered, independently viewed and interpreted by me.  Labs remarkable for CBG 222, WBC 23.8 -The patient was maintained on a cardiac monitor.  I personally viewed and interpreted the cardiac monitored which showed an underlying rhythm of: sinus brady -Imaging including abd/pelvis CT considered. -This patient presents to the ED for concern of abd pain, this involves an extensive number of treatment options, and is a complaint that carries with it a high risk of complications and morbidity.  The differential diagnosis includes cannabinoid hyperemesis syndrome, gastritis, biliary disease, appendicitis, colitis -Co morbidities that complicate the patient evaluation includes marijuana use -Treatment includes haldol, IVF, zofran -Reevaluation of the patient after these medicines showed that the patient improved -PCP office notes or outside notes reviewed -Discussion with oncoming provider who will reasssess pt and  determine dispos -Escalation to admission/observation considered: pending dispo         Final Clinical Impression(s) / ED Diagnoses Final diagnoses:  None    Rx / DC Orders ED Discharge Orders     None         Fayrene Helper, PA-C 09/29/22 1554    Lonell Grandchild, MD 10/01/22 1057

## 2022-09-29 NOTE — ED Notes (Signed)
ED TO INPATIENT HANDOFF REPORT  ED Nurse Name and Phone #: Norberta Keens [phone]  S Name/Age/Gender Shawn Maldonado 37 y.o. male Room/Bed: 038C/038C  Code Status   Code Status: Full Code  Home/SNF/Other Home Patient oriented to: self, place, time, and situation Is this baseline? Yes   Triage Complete: Triage complete  Chief Complaint Intractable nausea and vomiting [R11.2]  Triage Note Pt bib ems from home; c/o n/v/d and generalized abd pain since this am; pt cool and diaphoretic to touch; cbg 286; hx dm; denies fevers; 135/85, ; sinus arrhyhtmia noted with ems, hr 60-110, no cardiac hx, sats 100% RA; 4 mg Zofran and 30 mls IVF given pta; pt denies sick contacts; marijuana use yesterday   Allergies No Known Allergies  Level of Care/Admitting Diagnosis ED Disposition     ED Disposition  Admit   Condition  --   Comment  Hospital Area: [facility] [[id number]]  Level of Care: Telemetry Medical [104]  May place patient in observation at [facility] or [facility] if equivalent level of care is available:: Yes  Covid Evaluation: Asymptomatic - no recent exposure (last 10 days) testing not required  Diagnosis: Intractable nausea and vomiting [720114]  Admitting Physician: Briscoe Deutscher [[id number]]  Attending Physician: Briscoe Deutscher [[id number]]          B Medical/Surgery History Past Medical History:  Diagnosis Date   Diabetes mellitus without complication (HCC)    Past Surgical History:  Procedure Laterality Date   MOUTH SURGERY       A IV Location/Drains/Wounds Patient Lines/Drains/Airways Status     Active Line/Drains/Airways     Name Placement date Placement time Site Days   Peripheral IV 09/29/22 22 G Anterior;Left;Proximal Forearm 09/29/22  --  Forearm  less than 1   Peripheral IV 09/29/22 20 G Right Antecubital 09/29/22  1405  Antecubital  less than 1            Intake/Output Last 24 hours No intake or output data in the 24  hours ending 09/29/22 2138  Labs/Imaging Results for orders placed or performed during the hospital encounter of 09/29/22 (from the past 48 hour(s))  CBC with Differential     Status: Abnormal   Collection Time: 09/29/22  1:58 PM  Result Value Ref Range   WBC 23.8 (H) 4.0 - 10.5 K/uL   RBC 4.88 4.22 - 5.81 MIL/uL   Hemoglobin 14.7 13.0 - 17.0 g/dL   HCT 95.6 21.3 - 08.6 %   MCV 87.9 80.0 - 100.0 fL   MCH 30.1 26.0 - 34.0 pg   MCHC 34.3 30.0 - 36.0 g/dL   RDW 57.8 46.9 - 62.9 %   Platelets 272 150 - 400 K/uL   nRBC 0.0 0.0 - 0.2 %   Neutrophils Relative % 91 %   Neutro Abs 21.8 (H) 1.7 - 7.7 K/uL   Lymphocytes Relative 5 %   Lymphs Abs 1.1 0.7 - 4.0 K/uL   Monocytes Relative 3 %   Monocytes Absolute 0.7 0.1 - 1.0 K/uL   Eosinophils Relative 0 %   Eosinophils Absolute 0.0 0.0 - 0.5 K/uL   Basophils Relative 0 %   Basophils Absolute 0.1 0.0 - 0.1 K/uL   Immature Granulocytes 1 %   Abs Immature Granulocytes 0.11 (H) 0.00 - 0.07 K/uL    Comment: Performed at [facility], [address] [place], [place] [address]  Comprehensive metabolic panel     Status: Abnormal  Collection Time: 09/29/22  1:58 PM  Result Value Ref Range   Sodium 133 (L) 135 - 145 mmol/L   Potassium 3.9 3.5 - 5.1 mmol/L   Chloride 99 98 - 111 mmol/L   CO2 19 (L) 22 - 32 mmol/L   Glucose, Bld 232 (H) 70 - 99 mg/dL    Comment: Glucose reference range applies only to samples taken after fasting for at least 8 hours.   BUN 15 6 - 20 mg/dL   Creatinine, Ser 4.09 0.61 - 1.24 mg/dL   Calcium 9.8 8.9 - 81.1 mg/dL   Total Protein 7.8 6.5 - 8.1 g/dL   Albumin 4.7 3.5 - 5.0 g/dL   AST 30 15 - 41 U/L   ALT 22 0 - 44 U/L   Alkaline Phosphatase 67 38 - 126 U/L   Total Bilirubin 1.5 (H) 0.3 - 1.2 mg/dL   GFR, Estimated >91 >47 mL/min    Comment: (NOTE) Calculated using the CKD-EPI Creatinine Equation (2021)    Anion gap 15 5 - 15    Comment: Performed at [facility], [address], [place],  [place] [address]  Lipase, blood     Status: None   Collection Time: 09/29/22  1:58 PM  Result Value Ref Range   Lipase 22 11 - 51 U/L    Comment: Performed at [facility], [address], [place], [place] [address]  Ethanol     Status: None   Collection Time: 09/29/22  1:58 PM  Result Value Ref Range   Alcohol, Ethyl (B) <10 <10 mg/dL    Comment: (NOTE) Lowest detectable limit for serum alcohol is 10 mg/dL.  For medical purposes only. Performed at [facility], [address] [place], [place] [address]   CBG monitoring, ED     Status: Abnormal   Collection Time: 09/29/22  2:01 PM  Result Value Ref Range   Glucose-Capillary 222 (H) 70 - 99 mg/dL    Comment: Glucose reference range applies only to samples taken after fasting for at least 8 hours.  Urinalysis, Routine w reflex microscopic -Urine, Clean Catch     Status: Abnormal   Collection Time: 09/29/22  5:48 PM  Result Value Ref Range   Color, Urine YELLOW YELLOW   APPearance CLEAR CLEAR   Specific Gravity, Urine >1.046 (H) 1.005 - 1.030   pH 5.0 5.0 - 8.0   Glucose, UA 150 (A) NEGATIVE mg/dL   Hgb urine dipstick NEGATIVE NEGATIVE   Bilirubin Urine NEGATIVE NEGATIVE   Ketones, ur 80 (A) NEGATIVE mg/dL   Protein, ur NEGATIVE NEGATIVE mg/dL   Nitrite NEGATIVE NEGATIVE   Leukocytes,Ua NEGATIVE NEGATIVE    Comment: Performed at [facility], [address] [place], [place] [address]  Rapid urine drug screen (hospital performed)     Status: Abnormal   Collection Time: 09/29/22  5:48 PM  Result Value Ref Range   Opiates NONE DETECTED NONE DETECTED   Cocaine NONE DETECTED NONE DETECTED   Benzodiazepines NONE DETECTED NONE DETECTED   Amphetamines NONE DETECTED NONE DETECTED   Tetrahydrocannabinol POSITIVE (A) NONE DETECTED   Barbiturates NONE DETECTED NONE DETECTED    Comment: (NOTE) DRUG SCREEN FOR MEDICAL PURPOSES ONLY.  IF CONFIRMATION IS NEEDED FOR ANY PURPOSE, NOTIFY LAB WITHIN 5 DAYS.  LOWEST DETECTABLE  LIMITS FOR URINE DRUG SCREEN Drug Class                     Cutoff (  ng/mL) Amphetamine and metabolites    1000 Barbiturate and metabolites    200 Benzodiazepine                 200 Opiates and metabolites        300 Cocaine and metabolites        300 THC                            50 Performed at [facility], [address], [place], [place] [address]   POC CBG, ED     Status: Abnormal   Collection Time: 09/29/22  6:59 PM  Result Value Ref Range   Glucose-Capillary 107 (H) 70 - 99 mg/dL    Comment: Glucose reference range applies only to samples taken after fasting for at least 8 hours.  CBG monitoring, ED     Status: Abnormal   Collection Time: 09/29/22  8:21 PM  Result Value Ref Range   Glucose-Capillary 146 (H) 70 - 99 mg/dL    Comment: Glucose reference range applies only to samples taken after fasting for at least 8 hours.   CT ABDOMEN PELVIS W CONTRAST  Result Date: 09/29/2022 CLINICAL DATA:  Epigastric pain. EXAM: CT ABDOMEN AND PELVIS WITH CONTRAST TECHNIQUE: Multidetector CT imaging of the abdomen and pelvis was performed using the standard protocol following bolus administration of intravenous contrast. RADIATION DOSE REDUCTION: This exam was performed according to the departmental dose-optimization program which includes automated exposure control, adjustment of the mA and/or kV according to patient size and/or use of iterative reconstruction technique. CONTRAST:  75mL OMNIPAQUE IOHEXOL 350 MG/ML SOLN COMPARISON:  CT examination dated July 31, 2021 FINDINGS: Lower chest: No acute abnormality. Hepatobiliary: No focal liver abnormality is seen. No gallstones, gallbladder wall thickening, or biliary dilatation. Pancreas: Unremarkable. No pancreatic ductal dilatation or surrounding inflammatory changes. Spleen: Normal in size without focal abnormality. Adrenals/Urinary Tract: Adrenal glands are unremarkable. Kidneys are normal, without renal calculi, focal lesion, or  hydronephrosis. Mild generalized thickening of the urinary bladder wall. Stomach/Bowel: Stomach is within normal limits. Appendix not identified, however no inflammatory changes in the pericecal region. No evidence of bowel wall thickening, distention, or inflammatory changes. Vascular/Lymphatic: No significant vascular findings are present. No enlarged abdominal or pelvic lymph nodes. Reproductive: Prostate is unremarkable. Other: No abdominal wall hernia or abnormality. No abdominopelvic ascites. Musculoskeletal: No acute or significant osseous findings. IMPRESSION: 1. Mild generalized thickening of the urinary bladder wall, which may represent cystitis. Correlation with urinalysis is suggested. 2. No evidence of nephrolithiasis or hydronephrosis. 3. No evidence of acute abnormality within the abdomen or pelvis. Electronically Signed   By: Larose Hires D.O.   On: 09/29/2022 17:32    Pending Labs Unresulted Labs (From admission, onward)     Start     Ordered   10/06/22 0500  Creatinine, serum  (enoxaparin (LOVENOX)    CrCl >/= 30 ml/min)  Weekly,   R     Comments: while on enoxaparin therapy    09/29/22 2013   09/30/22 0500  HIV Antibody (routine testing w rflx)  (HIV Antibody (Routine testing w reflex) panel)  Tomorrow morning,   R        09/29/22 2013   09/30/22 0500  Comprehensive metabolic panel  Tomorrow morning,   R        09/29/22 2013   09/30/22 0500  Magnesium  Tomorrow morning,   R  09/29/22 2013   09/30/22 0500  CBC  Tomorrow morning,   R        09/29/22 2013   09/29/22 2030  C Difficile Quick Screen w PCR reflex  (C Difficile quick screen w PCR reflex panel )  Once, for 24 hours,   TIMED       References:    CDiff Information Tool   09/29/22 2029   09/29/22 2030  Gastrointestinal Panel by PCR , Stool  (Gastrointestinal Panel by PCR, Stool                                                                                                                                                      **Does Not include CLOSTRIDIUM DIFFICILE testing. **If CDIFF testing is needed, place order from the "C Difficile Testing" order set.**)  Once,   R        09/29/22 2029            Vitals/Pain Today's Vitals   09/29/22 2015 09/29/22 2029 09/29/22 2030 09/29/22 2045  BP: (!) 147/76  139/85 (!) 148/84  Pulse: (!) 125 99 (!) 108 (!) 104  Resp: 19     Temp:      TempSrc:      SpO2: 99% 100% 100% 100%  Weight:      Height:      PainSc:        Isolation Precautions Enteric precautions (UV disinfection)  Medications Medications  insulin detemir (LEVEMIR) injection 10 Units (has no administration in time range)  insulin aspart (novoLOG) injection 0-9 Units (1 Units Subcutaneous Given 09/29/22 2028)  enoxaparin (LOVENOX) injection 40 mg (40 mg Subcutaneous Given 09/29/22 2027)  sodium chloride flush (NS) 0.9 % injection 3 mL (has no administration in time range)  acetaminophen (TYLENOL) tablet 650 mg (has no administration in time range)    Or  acetaminophen (TYLENOL) suppository 650 mg (has no administration in time range)  lactated ringers infusion ( Intravenous New Bag/Given 09/29/22 2029)  ketorolac (TORADOL) 30 MG/ML injection 30 mg (has no administration in time range)  HYDROcodone-acetaminophen (NORCO/VICODIN) 5-325 MG per tablet 1-2 tablet (has no administration in time range)  ondansetron (ZOFRAN) tablet 4 mg (has no administration in time range)    Or  ondansetron (ZOFRAN) injection 4 mg (has no administration in time range)  promethazine (PHENERGAN) 12.5 mg in sodium chloride 0.9 % 50 mL IVPB (has no administration in time range)  sodium chloride 0.9 % bolus 1,000 mL (0 mLs Intravenous Stopped 09/29/22 1604)  ondansetron (ZOFRAN) injection 4 mg (4 mg Intravenous Given 09/29/22 1429)  haloperidol lactate (HALDOL) injection 5 mg (5 mg Intravenous Given 09/29/22 1429)  LORazepam (ATIVAN) injection 1 mg (1 mg Intravenous Given 09/29/22 1626)  insulin aspart (novoLOG)  injection 5 Units (5 Units Subcutaneous Given 09/29/22 1625)  lactated  ringers bolus 1,000 mL (0 mLs Intravenous Stopped 09/29/22 1846)  famotidine (PEPCID) IVPB 20 mg premix (0 mg Intravenous Stopped 09/29/22 1720)  metoCLOPramide (REGLAN) injection 10 mg (10 mg Intravenous Given 09/29/22 1626)  diphenhydrAMINE (BENADRYL) capsule 25 mg (25 mg Oral Given 09/29/22 1626)  dicyclomine (BENTYL) capsule 20 mg (20 mg Oral Given 09/29/22 1626)  iohexol (OMNIPAQUE) 350 MG/ML injection 75 mL (75 mLs Intravenous Contrast Given 09/29/22 1650)    Mobility walks     Focused Assessments Cardiac Assessment Handoff:    Lab Results  Component Value Date   TROPONINI <0.30 09/25/2012   No results found for: "DDIMER" Does the Patient currently have chest pain? Yes    R Recommendations: See Admitting Provider Note  Report given to:   Additional Notes: patient A&O x4

## 2022-09-29 NOTE — ED Triage Notes (Signed)
Pt bib ems from home; c/o n/v/d and generalized abd pain since this am; pt cool and diaphoretic to touch; cbg 286; hx dm; denies fevers; 135/85, ; sinus arrhyhtmia noted with ems, hr 60-110, no cardiac hx, sats 100% RA; 4 mg Zofran and 30 mls IVF given pta; pt denies sick contacts; marijuana use yesterday

## 2022-09-30 DIAGNOSIS — R112 Nausea with vomiting, unspecified: Secondary | ICD-10-CM | POA: Diagnosis not present

## 2022-09-30 LAB — GASTROINTESTINAL PANEL BY PCR, STOOL (REPLACES STOOL CULTURE)

## 2022-09-30 LAB — COMPREHENSIVE METABOLIC PANEL
ALT: 15 U/L (ref 0–44)
AST: 16 U/L (ref 15–41)
Albumin: 3.6 g/dL (ref 3.5–5.0)
Alkaline Phosphatase: 53 U/L (ref 38–126)
Anion gap: 11 (ref 5–15)
BUN: 11 mg/dL (ref 6–20)
CO2: 22 mmol/L (ref 22–32)
Calcium: 8.9 mg/dL (ref 8.9–10.3)
Chloride: 104 mmol/L (ref 98–111)
Creatinine, Ser: 0.87 mg/dL (ref 0.61–1.24)
GFR, Estimated: >60 mL/min (ref >60)
Glucose, Bld: 168 mg/dL — ABNORMAL HIGH (ref 70–99)
Potassium: 3.6 mmol/L (ref 3.5–5.1)
Sodium: 137 mmol/L (ref 135–145)
Total Bilirubin: 1.7 mg/dL — ABNORMAL HIGH (ref 0.3–1.2)
Total Protein: 6.1 g/dL — ABNORMAL LOW (ref 6.5–8.1)

## 2022-09-30 LAB — GLUCOSE, CAPILLARY
Glucose-Capillary: 170 mg/dL — ABNORMAL HIGH (ref 70–99)
Glucose-Capillary: 200 mg/dL — ABNORMAL HIGH (ref 70–99)
Glucose-Capillary: 157 mg/dL — ABNORMAL HIGH (ref 70–99)
Glucose-Capillary: 161 mg/dL — ABNORMAL HIGH (ref 70–99)
Glucose-Capillary: 273 mg/dL — ABNORMAL HIGH (ref 70–99)

## 2022-09-30 LAB — CBC
HCT: 35.7 % — ABNORMAL LOW (ref 39.0–52.0)
Hemoglobin: 12.4 g/dL — ABNORMAL LOW (ref 13.0–17.0)
MCH: 30.6 pg (ref 26.0–34.0)
MCHC: 34.7 g/dL (ref 30.0–36.0)
MCV: 88.1 fL (ref 80.0–100.0)
Platelets: 207 10*3/uL (ref 150–400)
RBC: 4.05 MIL/uL — ABNORMAL LOW (ref 4.22–5.81)
RDW: 12 % (ref 11.5–15.5)
WBC: 15 10*3/uL — ABNORMAL HIGH (ref 4.0–10.5)
nRBC: 0 % (ref 0.0–0.2)

## 2022-09-30 LAB — C DIFFICILE QUICK SCREEN W PCR REFLEX
C Diff antigen: NEGATIVE
C Diff interpretation: NOT DETECTED
C Diff toxin: NEGATIVE

## 2022-09-30 LAB — MAGNESIUM: Magnesium: 1.6 mg/dL — ABNORMAL LOW (ref 1.7–2.4)

## 2022-09-30 LAB — SARS CORONAVIRUS 2 BY RT PCR: SARS Coronavirus 2 by RT PCR: POSITIVE — AB

## 2022-09-30 MED ORDER — ONDANSETRON HCL 4 MG PO TABS: 4.0000 mg | ORAL_TABLET | Freq: Four times a day (QID) | ORAL | 0 refills | Status: DC | PRN

## 2022-09-30 MED ORDER — VITAMIN C 500 MG PO TABS
500.0000 mg | ORAL_TABLET | Freq: Every day | ORAL | Status: DC
  Administered 2022-09-30: 500 mg via ORAL
  Filled 2022-09-30: qty 1

## 2022-09-30 MED ORDER — ZINC SULFATE 220 (50 ZN) MG PO CAPS: 220.0000 mg | ORAL_CAPSULE | Freq: Every day | ORAL | 0 refills | Status: AC

## 2022-09-30 MED ORDER — ZINC SULFATE 220 (50 ZN) MG PO CAPS
220.0000 mg | ORAL_CAPSULE | Freq: Every day | ORAL | Status: DC
  Administered 2022-09-30: 220 mg via ORAL
  Filled 2022-09-30: qty 1

## 2022-09-30 MED ORDER — ASCORBIC ACID 500 MG PO TABS: 500.0000 mg | ORAL_TABLET | Freq: Every day | ORAL | 0 refills | Status: AC

## 2022-09-30 MED ORDER — ORAL CARE MOUTH RINSE: 15.0000 mL | OROMUCOSAL | Status: DC | PRN

## 2022-09-30 NOTE — Plan of Care (Signed)
Discharging to home

## 2022-09-30 NOTE — Plan of Care (Signed)
  Problem: Fluid Volume: Goal: Ability to maintain a balanced intake and output will improve Outcome: Progressing   Problem: Metabolic: Goal: Ability to maintain appropriate glucose levels will improve Outcome: Progressing   Problem: Nutritional: Goal: Maintenance of adequate nutrition will improve Outcome: Progressing   Problem: Skin Integrity: Goal: Risk for impaired skin integrity will decrease Outcome: Progressing   Problem: Clinical Measurements: Goal: Cardiovascular complication will be avoided Outcome: Progressing   Problem: Activity: Goal: Risk for activity intolerance will decrease Outcome: Progressing   Problem: Nutrition: Goal: Adequate nutrition will be maintained Outcome: Progressing   Problem: Clinical Measurements: Goal: Respiratory complications will improve Outcome: Not Applicable

## 2022-09-30 NOTE — Hospital Course (Signed)
37 y.o. male with medical history significant for type 1 diabetes mellitus who presents to the emergency department with nausea, vomiting, and diarrhea.   Patient reports that he has been suffering with nausea, recurrent episodes of nonbloody vomiting, and diarrhea since this morning.  He has had roughly 5 loose stools today.  He was having some generalized abdominal pain earlier, but denies that now.  Pt was observed

## 2022-09-30 NOTE — Discharge Summary (Signed)
Physician Discharge Summary   Patient: Shawn Maldonado MRN: [id number] DOB: Jan 20, 1986  Admit date:     09/29/2022  Discharge date: 09/30/22  Discharge Physician: Rickey Barbara   PCP: Hoy Register, MD   Recommendations at discharge:    Follow up with PCP in 1-2 weeks  Discharge Diagnoses: Principal Problem:   Intractable nausea and vomiting Active Problems:   Type I diabetes mellitus (HCC)   SIRS (systemic inflammatory response syndrome) (HCC)  Resolved Problems:   * No resolved hospital problems. *  Hospital Course: 37 y.o. male with medical history significant for type 1 diabetes mellitus who presents to the emergency department with nausea, vomiting, and diarrhea.   Patient reports that he has been suffering with nausea, recurrent episodes of nonbloody vomiting, and diarrhea since this morning.  He has had roughly 5 loose stools today.  He was having some generalized abdominal pain earlier, but denies that now.  Pt was observed  Assessment and Plan: 1. Intractable N/V likely related COVID - Exam is benign, no urinary symptoms - Also reports 5 loose stools in the last 24 hrs  - Improved with IVF overnight with symptoms essentially resolved - Successfully advanced to soft diet   2. Type I DM  - A1c was 10.5% on 08/30/22  -cont home regimen on d/c    3. SIRS  - No fever but multiple other SIRS criteria present on admission  - He may have a gastroenteritis but there does not appear to be evidence for serious bacterial infection at this time  - Monitor off of antibiotics -No dysuria symptoms   4. COVID -Noted incidentally on screening exam -No respiratory symptoms -Ordered vit C and zinc   Consultants:  Procedures performed:   Disposition: Home Diet recommendation:  Carb modified diet DISCHARGE MEDICATION: Allergies as of 09/30/2022   No Known Allergies      Medication List     STOP taking these medications    amoxicillin 500 MG capsule Commonly  known as: AMOXIL   dicyclomine 20 MG tablet Commonly known as: BENTYL   famotidine 20 MG tablet Commonly known as: PEPCID   prochlorperazine 10 MG tablet Commonly known as: COMPAZINE       TAKE these medications    ascorbic acid 500 MG tablet Commonly known as: VITAMIN C Take 1 tablet (500 mg total) by mouth daily for 7 days. Start taking on: October 01, 2022   [facility] G7 Receiver Hardie Pulley Check blood sugar three times daily. E10.69   Dexcom G7 Sensor Misc Check blood sugar three times daily. Change sensors once every 10 days. E10.69   gabapentin 300 MG capsule Commonly known as: NEURONTIN Take 1 capsule (300 mg total) by mouth at bedtime.   insulin aspart 100 UNIT/ML injection Commonly known as: novoLOG Inject 0-12 Units into the skin 3 (three) times daily before meals. Per sliding scale   insulin glargine 100 UNIT/ML injection Commonly known as: Lantus ADMINISTER 25 UNITS UNDER THE SKIN DAILY What changed:  how much to take how to take this when to take this additional instructions   omeprazole 40 MG capsule Commonly known as: PRILOSEC Take 1 capsule (40 mg total) by mouth daily.   ondansetron 4 MG tablet Commonly known as: ZOFRAN Take 1 tablet (4 mg total) by mouth every 6 (six) hours as needed for nausea. What changed:  when to take this reasons to take this   zinc sulfate 220 (50 Zn) MG capsule Take 1 capsule (220 mg total) by  mouth daily for 7 days. Start taking on: October 01, 2022        Follow-up Information     [facility] .   Specialty: Gastroenterology Contact information: [address] [place] [address] [phone]        Gastroenterology, Deboraha Sprang .   Contact information: [address] [place] [address] [phone]         Hoy Register, MD Follow up in 2 week(s).   Specialty: Family Medicine Why: Hospital follow up Contact information: [address] [place]  [address] [place] [address] [phone]                Discharge Exam: Ceasar Mons Weights   09/29/22 1352 09/30/22 0453  Weight: 61.2 kg 62.8 kg   General exam: Awake, laying in bed, in nad Respiratory system: Normal respiratory effort, no wheezing Cardiovascular system: regular rate, s1, s2 Gastrointestinal system: Soft, nondistended, positive BS Central nervous system: CN2-12 grossly intact, strength intact Extremities: Perfused, no clubbing Skin: Normal skin turgor, no notable skin lesions seen Psychiatry: Mood normal // no visual hallucinations   Condition at discharge: fair  The results of significant diagnostics from this hospitalization (including imaging, microbiology, ancillary and laboratory) are listed below for reference.   Imaging Studies: CT ABDOMEN PELVIS W CONTRAST  Result Date: 09/29/2022 CLINICAL DATA:  Epigastric pain. EXAM: CT ABDOMEN AND PELVIS WITH CONTRAST TECHNIQUE: Multidetector CT imaging of the abdomen and pelvis was performed using the standard protocol following bolus administration of intravenous contrast. RADIATION DOSE REDUCTION: This exam was performed according to the departmental dose-optimization program which includes automated exposure control, adjustment of the mA and/or kV according to patient size and/or use of iterative reconstruction technique. CONTRAST:  75mL OMNIPAQUE IOHEXOL 350 MG/ML SOLN COMPARISON:  CT examination dated July 31, 2021 FINDINGS: Lower chest: No acute abnormality. Hepatobiliary: No focal liver abnormality is seen. No gallstones, gallbladder wall thickening, or biliary dilatation. Pancreas: Unremarkable. No pancreatic ductal dilatation or surrounding inflammatory changes. Spleen: Normal in size without focal abnormality. Adrenals/Urinary Tract: Adrenal glands are unremarkable. Kidneys are normal, without renal calculi, focal lesion, or hydronephrosis. Mild generalized thickening of the urinary bladder wall. Stomach/Bowel: Stomach  is within normal limits. Appendix not identified, however no inflammatory changes in the pericecal region. No evidence of bowel wall thickening, distention, or inflammatory changes. Vascular/Lymphatic: No significant vascular findings are present. No enlarged abdominal or pelvic lymph nodes. Reproductive: Prostate is unremarkable. Other: No abdominal wall hernia or abnormality. No abdominopelvic ascites. Musculoskeletal: No acute or significant osseous findings. IMPRESSION: 1. Mild generalized thickening of the urinary bladder wall, which may represent cystitis. Correlation with urinalysis is suggested. 2. No evidence of nephrolithiasis or hydronephrosis. 3. No evidence of acute abnormality within the abdomen or pelvis. Electronically Signed   By: Larose Hires D.O.   On: 09/29/2022 17:32    Microbiology: Results for orders placed or performed during the hospital encounter of 09/29/22  C Difficile Quick Screen w PCR reflex     Status: None   Collection Time: 09/30/22 10:19 AM   Specimen: STOOL  Result Value Ref Range Status   C Diff antigen NEGATIVE NEGATIVE Final   C Diff toxin NEGATIVE NEGATIVE Final   C Diff interpretation No C. difficile detected.  Final    Comment: Performed at [facility], [address], [place], [place] [address]  Gastrointestinal Panel by PCR , Stool     Status: None   Collection  Time: 09/30/22 10:19 AM   Specimen: STOOL  Result Value Ref Range Status   Campylobacter species NOT DETECTED NOT DETECTED Final   Plesimonas shigelloides NOT DETECTED NOT DETECTED Final   Salmonella species NOT DETECTED NOT DETECTED Final   Yersinia enterocolitica NOT DETECTED NOT DETECTED Final   Vibrio species NOT DETECTED NOT DETECTED Final   Vibrio cholerae NOT DETECTED NOT DETECTED Final   Enteroaggregative E coli (EAEC) NOT DETECTED NOT DETECTED Final   Enteropathogenic E coli (EPEC) NOT DETECTED NOT DETECTED Final   Enterotoxigenic E coli (ETEC) NOT DETECTED NOT DETECTED Final    Shiga like toxin producing E coli (STEC) NOT DETECTED NOT DETECTED Final   Shigella/Enteroinvasive E coli (EIEC) NOT DETECTED NOT DETECTED Final   Cryptosporidium NOT DETECTED NOT DETECTED Final   Cyclospora cayetanensis NOT DETECTED NOT DETECTED Final   Entamoeba histolytica NOT DETECTED NOT DETECTED Final   Giardia lamblia NOT DETECTED NOT DETECTED Final   Adenovirus F40/41 NOT DETECTED NOT DETECTED Final   Astrovirus NOT DETECTED NOT DETECTED Final   Norovirus GI/GII NOT DETECTED NOT DETECTED Final   Rotavirus A NOT DETECTED NOT DETECTED Final   Sapovirus (I, II, IV, and V) NOT DETECTED NOT DETECTED Final    Comment: Performed at [facility], [address] [place], [place] [address]  SARS Coronavirus 2 by RT PCR (hospital order, performed in [facility] hospital lab) *cepheid single result test* Anterior Nasal Swab     Status: Abnormal   Collection Time: 09/30/22 12:10 PM   Specimen: Anterior Nasal Swab  Result Value Ref Range Status   SARS Coronavirus 2 by RT PCR POSITIVE (A) NEGATIVE Final    Comment: Performed at [facility], [address], [place], [place] [address]    Labs: CBC: Recent Labs  Lab 09/29/22 1358 09/30/22 0733  WBC 23.8* 15.0*  NEUTROABS 21.8*  --   HGB 14.7 12.4*  HCT 42.9 35.7*  MCV 87.9 88.1  PLT 272 207   Basic Metabolic Panel: Recent Labs  Lab 09/29/22 1358 09/30/22 0733  NA 133* 137  K 3.9 3.6  CL 99 104  CO2 19* 22  GLUCOSE 232* 168*  BUN 15 11  CREATININE 0.95 0.87  CALCIUM 9.8 8.9  MG  --  1.6*   Liver Function Tests: Recent Labs  Lab 09/29/22 1358 09/30/22 0733  AST 30 16  ALT 22 15  ALKPHOS 67 53  BILITOT 1.5* 1.7*  PROT 7.8 6.1*  ALBUMIN 4.7 3.6   CBG: Recent Labs  Lab 09/30/22 0200 09/30/22 0415 09/30/22 0745 09/30/22 1200 09/30/22 1625  GLUCAP 170* 200* 157* 161* 273*    Discharge time spent: less than 30 minutes.  Signed: Rickey Barbara, MD [facility] 09/30/2022

## 2022-10-02 ENCOUNTER — Telehealth: Payer: Self-pay

## 2022-10-02 ENCOUNTER — Ambulatory Visit: Payer: Medicaid Other | Admitting: Family Medicine

## 2022-10-02 LAB — HEMOGLOBIN A1C
Hgb A1c MFr Bld: 9.4 % — ABNORMAL HIGH (ref 4.8–5.6)
Mean Plasma Glucose: 223 mg/dL

## 2022-10-02 NOTE — Transitions of Care (Post Inpatient/ED Visit) (Signed)
10/02/2022  Name: Shawn Maldonado MRN: [id number] DOB: Sep 17, 1985  Today's TOC FU Call Status: Today's TOC FU Call Status:: Successful TOC FU Call Competed Unsuccessful Call (1st Attempt) Date: 10/02/22 [facility] FU Call Complete Date: 10/02/22  Transition Care Management Follow-up Telephone Call Date of Discharge: 09/30/22 Discharge Facility: [facility]) Type of Discharge: Inpatient Admission Primary Inpatient Discharge Diagnosis:: intractable nausea and vomiting How have you been since you were released from the hospital?: Better (He said he is feeling much better and he does not have coough or respiratory symptoms of COVID despite testing positive.) Any questions or concerns?: No  Items Reviewed: Did you receive and understand the discharge instructions provided?: Yes Medications obtained,verified, and reconciled?: Yes (Medications Reviewed) (He also has a Dexcom CGM) Any new allergies since your discharge?: No Dietary orders reviewed?: Yes Type of Diet Ordered:: diabetic Do you have support at home?: Yes  Medications Reviewed Today: Medications Reviewed Today     Reviewed by Robyne Peers, RN (Case Manager) on 10/02/22 at 1438  Med List Status: <None>   Medication Order Taking? Sig Documenting Provider Last Dose Status Informant  ascorbic acid (VITAMIN C) 500 MG tablet [id number]  Take 1 tablet (500 mg total) by mouth daily for 7 days. Jerald Kief, MD  Active   Continuous Blood Gluc Receiver [facility] G7 RECEIVER) [place] [id number] No Check blood sugar three times daily. E10.69 Hoy Register, MD Taking Active Self, Pharmacy Records  Continuous Blood Gluc Sensor (DEXCOM G7 SENSOR) MISC [id number] No Check blood sugar three times daily. Change sensors once every 10 days. E10.69 Hoy Register, MD Taking Active Self, Pharmacy Records  gabapentin (NEURONTIN) 300 MG capsule [id number] No Take 1 capsule (300 mg total) by mouth at bedtime. Hoy Register, MD Past Week Active Self,  Pharmacy Records  insulin aspart (NOVOLOG) 100 UNIT/ML injection [id number] No Inject 0-12 Units into the skin 3 (three) times daily before meals. Per sliding scale Hoy Register, MD 09/29/2022 Active Self, Pharmacy Records  insulin glargine (LANTUS) 100 UNIT/ML injection [id number] No ADMINISTER 25 UNITS UNDER THE SKIN DAILY  Patient taking differently: Inject 25 Units into the skin daily.   Hoy Register, MD 09/29/2022 Active Self, Pharmacy Records  omeprazole (PRILOSEC) 40 MG capsule [id number] No Take 1 capsule (40 mg total) by mouth daily. Hoy Register, MD 09/29/2022 Active Self, Pharmacy Records  ondansetron [facility]) 4 MG tablet [id number]  Take 1 tablet (4 mg total) by mouth every 6 (six) hours as needed for nausea. Jerald Kief, MD  Active   zinc sulfate 220 (50 Zn) MG capsule [id number]  Take 1 capsule (220 mg total) by mouth daily for 7 days. Jerald Kief, MD  Active             Home Care and Equipment/Supplies: Were Home Health Services Ordered?: No Any new equipment or medical supplies ordered?: No  Functional Questionnaire: Do you need assistance with bathing/showering or dressing?: No Do you need assistance with meal preparation?: No Do you need assistance with eating?: No Do you have difficulty maintaining continence: No Do you need assistance with getting out of bed/getting out of a chair/moving?: No Do you have difficulty managing or taking your medications?: No  Follow up appointments reviewed: PCP Follow-up appointment confirmed?: Yes Date of PCP follow-up appointment?: 10/11/22 (He wanted to be seen as soon as possible.) Follow-up Provider: Dr Alvis Lemmings Do you need transportation to your follow-up appointment?: No Do you understand care options if your condition(s) worsen?: Yes-patient verbalized understanding  SIGNATURE  Robyne Peers, RN

## 2022-10-02 NOTE — Transitions of Care (Post Inpatient/ED Visit) (Signed)
   10/02/2022  Name: YECHESKEL PERNICK MRN: [id number] DOB: Aug 25, 1985  Today's TOC FU Call Status: Today's TOC FU Call Status:: Unsuccessul Call (1st Attempt) Unsuccessful Call (1st Attempt) Date: 10/02/22  Attempted to reach the patient regarding the most recent Inpatient/ED visit.  Follow Up Plan: Additional outreach attempts will be made to reach the patient to complete the Transitions of Care (Post Inpatient/ED visit) call.   Signature  Robyne Peers, RN

## 2022-10-03 LAB — MISC LABCORP TEST (SEND OUT): Labcorp test code: 83935

## 2022-10-11 ENCOUNTER — Ambulatory Visit: Payer: Medicaid Other | Attending: Family Medicine | Admitting: Family Medicine

## 2022-10-11 ENCOUNTER — Encounter: Payer: Self-pay | Admitting: Family Medicine

## 2022-10-11 VITALS — BP 113/72 | HR 93 | Temp 98.2°F | Ht 70.0 in | Wt 134.0 lb

## 2022-10-11 DIAGNOSIS — E1069 Type 1 diabetes mellitus with other specified complication: Secondary | ICD-10-CM | POA: Diagnosis not present

## 2022-10-11 DIAGNOSIS — R112 Nausea with vomiting, unspecified: Secondary | ICD-10-CM

## 2022-10-11 NOTE — Progress Notes (Signed)
Subjective:  Patient ID: Shawn Maldonado, male    DOB: 1985-08-29  Age: 37 y.o. MRN: [id number]  CC: Hospitalization Follow-up   HPI Shawn Maldonado is a 37 y.o. year old male with a history of type 1 diabetes mellitus (A1c 9.4). Presents today for transitional care visit after hospitalization for nausea and vomiting, COVID-19 infection, SIRS.  This was treated with IV fluids, vitamin C and zinc.  He had no respiratory symptoms.  Diet was gradually advanced.  Interval History:  Today he reports doing great and his GI symptoms have resolved but he is concerned that he has had nausea and vomiting which he states has been frequent.  1 month prior to most recent hospitalization he had an ED visit for gastroenteritis.  His chart does reveal a history of gastroparesis however he has not had a gastric emptying study.  Regards to his diabetes mellitus A1c is 9.4. QHS he administers 20-25 units of Lantus and occasionally decreases to 15 units he is concerned that he might have hypoglycemia.  CGM data:  7 day average: 190 14 day average: 200 Time in range: 43% High: 30% Very high: 25% Low: 1% Very low: 1%   Denies presence of additional concerns.  Past Medical History:  Diagnosis Date   Diabetes mellitus without complication (HCC)     Past Surgical History:  Procedure Laterality Date   MOUTH SURGERY      Family History  Problem Relation Age of Onset   Stroke Father    CAD Other     Social History   Socioeconomic History   Marital status: Single    Spouse name: Not on file   Number of children: 2   Years of education: Not on file   Highest education level: Not on file  Occupational History   Occupation: Works in a Science writer.  Tobacco Use   Smoking status: Former    Types: Cigarettes   Smokeless tobacco: Never  Vaping Use   Vaping Use: Never used  Substance and Sexual Activity   Alcohol use: Yes    Comment: social   Drug use: Yes    Types:  Marijuana   Sexual activity: Never  Other Topics Concern   Not on file  Social History Narrative   Lives with girlfriend.     Social Determinants of Health   Financial Resource Strain: Not on file  Food Insecurity: Not on file  Transportation Needs: Not on file  Physical Activity: Not on file  Stress: Not on file  Social Connections: Not on file    No Known Allergies  Outpatient Medications Prior to Visit  Medication Sig Dispense Refill   Continuous Blood Gluc Receiver (DEXCOM G7 RECEIVER) [place] Check blood sugar three times daily. E10.69 1 each 0   Continuous Blood Gluc Sensor (DEXCOM G7 SENSOR) MISC Check blood sugar three times daily. Change sensors once every 10 days. E10.69 3 each 3   gabapentin (NEURONTIN) 300 MG capsule Take 1 capsule (300 mg total) by mouth at bedtime. 90 capsule 1   insulin aspart (NOVOLOG) 100 UNIT/ML injection Inject 0-12 Units into the skin 3 (three) times daily before meals. Per sliding scale 30 mL 6   insulin glargine (LANTUS) 100 UNIT/ML injection ADMINISTER 25 UNITS UNDER THE SKIN DAILY (Patient taking differently: Inject 25 Units into the skin daily.) 30 mL 6   omeprazole (PRILOSEC) 40 MG capsule Take 1 capsule (40 mg total) by mouth daily. 90 capsule 1   ondansetron (ZOFRAN)  4 MG tablet Take 1 tablet (4 mg total) by mouth every 6 (six) hours as needed for nausea. 20 tablet 0   No facility-administered medications prior to visit.     ROS Review of Systems  Constitutional:  Negative for activity change and appetite change.  HENT:  Negative for sinus pressure and sore throat.   Respiratory:  Negative for chest tightness, shortness of breath and wheezing.   Cardiovascular:  Negative for chest pain and palpitations.  Gastrointestinal:  Negative for abdominal distention, abdominal pain and constipation.  Genitourinary: Negative.   Musculoskeletal: Negative.   Psychiatric/Behavioral:  Negative for behavioral problems and dysphoric mood.      Objective:  BP 113/72   Pulse 93   Temp 98.2 F (36.8 C) (Oral)   Ht 5\' 10"  (1.778 m)   Wt 134 lb (60.8 kg)   SpO2 100%   BMI 19.23 kg/m      10/11/2022   10:13 AM 09/30/2022    4:03 PM 09/30/2022    9:33 AM  BP/Weight  Systolic BP 113 134 126  Diastolic BP 72 78 75  Wt. (Lbs) 134    BMI 19.23 kg/m2        Physical Exam Constitutional:      Appearance: He is well-developed.  Cardiovascular:     Rate and Rhythm: Normal rate.     Heart sounds: Normal heart sounds. No murmur heard. Pulmonary:     Effort: Pulmonary effort is normal.     Breath sounds: Normal breath sounds. No wheezing or rales.  Chest:     Chest wall: No tenderness.  Abdominal:     General: Bowel sounds are normal. There is no distension.     Palpations: Abdomen is soft. There is no mass.     Tenderness: There is no abdominal tenderness.  Musculoskeletal:        General: Normal range of motion.     Right lower leg: No edema.     Left lower leg: No edema.  Neurological:     Mental Status: He is alert and oriented to person, place, and time.  Psychiatric:        Mood and Affect: Mood normal.        Latest Ref Rng & Units 09/30/2022    7:33 AM 09/29/2022    1:58 PM 08/15/2022    5:35 PM  CMP  Glucose 70 - 99 mg/dL 161  096  045   BUN 6 - 20 mg/dL 11  15  21    Creatinine 0.61 - 1.24 mg/dL 4.09  8.11  9.14   Sodium 135 - 145 mmol/L 137  133  131   Potassium 3.5 - 5.1 mmol/L 3.6  3.9  3.9   Chloride 98 - 111 mmol/L 104  99  102   CO2 22 - 32 mmol/L 22  19  22    Calcium 8.9 - 10.3 mg/dL 8.9  9.8  8.7   Total Protein 6.5 - 8.1 g/dL 6.1  7.8  7.5   Total Bilirubin 0.3 - 1.2 mg/dL 1.7  1.5  1.5   Alkaline Phos 38 - 126 U/L 53  67  61   AST 15 - 41 U/L 16  30  28    ALT 0 - 44 U/L 15  22  24      Lipid Panel     Component Value Date/Time   CHOL 164 07/28/2018 1042   TRIG 43 07/28/2018 1042   HDL 58 07/28/2018 1042   CHOLHDL  2.8 07/28/2018 1042   VLDL 9 07/28/2018 1042   LDLCALC 97  07/28/2018 1042    CBC    Component Value Date/Time   WBC 15.0 (H) 09/30/2022 0733   RBC 4.05 (L) 09/30/2022 0733   HGB 12.4 (L) 09/30/2022 0733   HCT 35.7 (L) 09/30/2022 0733   PLT 207 09/30/2022 0733   MCV 88.1 09/30/2022 0733   MCH 30.6 09/30/2022 0733   MCHC 34.7 09/30/2022 0733   RDW 12.0 09/30/2022 0733   LYMPHSABS 1.1 09/29/2022 1358   MONOABS 0.7 09/29/2022 1358   EOSABS 0.0 09/29/2022 1358   BASOSABS 0.1 09/29/2022 1358    Lab Results  Component Value Date   HGBA1C 9.4 (H) 09/30/2022    Assessment & Plan:  1. Type 1 diabetes mellitus with other specified complication (HCC) Uncontrolled with A1c of 9.4, goal is less than 7.0 He has had some high sugars and explains that there might have been a delay in getting to his NovoLog to administer after eating accounting for his hyperglycemia Continue current regimen Counseled on Diabetic diet, my plate method, 161 minutes of moderate intensity exercise/week Blood sugar logs with fasting goals of 80-120 mg/dl, random of less than 096 and in the event of sugars less than 60 mg/dl or greater than 045 mg/dl encouraged to notify the clinic. Advised on the need for annual eye exams, annual foot exams, Pneumonia vaccine. - Amb ref to Medical Nutrition Therapy-MNT - CMP14+EGFR  2. Nausea and vomiting, unspecified vomiting type He has had frequent episodes of this. Will need an official gastric emptying study to diagnose gastroparesis Advised to eat small frequent portions - NM Gastric Emptying; Future    No orders of the defined types were placed in this encounter.   Follow-up: Return in about 3 months (around 01/11/2023).       Hoy Register, MD, FAAFP. [facility] [place], [place] [phone]   10/11/2022, 11:55 AM

## 2022-10-11 NOTE — Patient Instructions (Signed)

## 2022-10-12 LAB — CMP14+EGFR
ALT: 15 IU/L (ref 0–44)
AST: 19 IU/L (ref 0–40)
Albumin/Globulin Ratio: 1.7
Albumin: 4.7 g/dL (ref 4.1–5.1)
Alkaline Phosphatase: 68 IU/L (ref 44–121)
BUN/Creatinine Ratio: 17 (ref 9–20)
BUN: 13 mg/dL (ref 6–20)
Bilirubin Total: 0.4 mg/dL (ref 0.0–1.2)
CO2: 21 mmol/L (ref 20–29)
Calcium: 9.8 mg/dL (ref 8.7–10.2)
Chloride: 102 mmol/L (ref 96–106)
Creatinine, Ser: 0.77 mg/dL (ref 0.76–1.27)
Globulin, Total: 2.7 g/dL (ref 1.5–4.5)
Glucose: 193 mg/dL — ABNORMAL HIGH (ref 70–99)
Potassium: 4.8 mmol/L (ref 3.5–5.2)
Sodium: 139 mmol/L (ref 134–144)
Total Protein: 7.4 g/dL (ref 6.0–8.5)
eGFR: 118 mL/min/{1.73_m2} (ref >59)

## 2022-10-23 ENCOUNTER — Other Ambulatory Visit: Payer: Self-pay | Admitting: Family Medicine

## 2022-10-23 ENCOUNTER — Encounter (HOSPITAL_COMMUNITY)
Admission: RE | Admit: 2022-10-23 | Discharge: 2022-10-23 | Disposition: A | Discharge status: Home / Self Care | Payer: Medicaid Other | Source: Ambulatory Visit | Attending: Family Medicine | Admitting: Family Medicine

## 2022-10-23 DIAGNOSIS — R112 Nausea with vomiting, unspecified: Secondary | ICD-10-CM | POA: Diagnosis present

## 2022-10-23 DIAGNOSIS — E1069 Type 1 diabetes mellitus with other specified complication: Secondary | ICD-10-CM

## 2022-10-23 MED ORDER — TECHNETIUM TC 99M SULFUR COLLOID
2.0000 | Freq: Once | INTRAVENOUS | Status: AC | PRN
  Administered 2022-10-23: 2 via ORAL

## 2022-10-24 NOTE — Telephone Encounter (Signed)
Patient should now have Rx- Lantus 30ml 6 RF on file- 08/30/22 Requested Prescriptions  Pending Prescriptions Disp Refills   insulin glargine (LANTUS) 100 UNIT/ML injection [Pharmacy Med Name: INSULIN GLARGINE 100U/ML, 10ML] 10 mL     Sig: ADMINISTER 25 UNITS UNDER THE SKIN DAILY     Endocrinology:  Diabetes - Insulins Failed - 10/23/2022  1:26 PM      Failed - HBA1C is between 0 and 7.9 and within 180 days    HbA1c, POC (controlled diabetic range)  Date Value Ref Range Status  08/30/2022 10.5 (A) 0.0 - 7.0 % Final   Hgb A1c MFr Bld  Date Value Ref Range Status  09/30/2022 9.4 (H) 4.8 - 5.6 % Final    Comment:    (NOTE)         Prediabetes: 5.7 - 6.4         Diabetes: >6.4         Glycemic control for adults with diabetes: <7.0          Passed - Valid encounter within last 6 months    Recent Outpatient Visits           1 week ago Type 1 diabetes mellitus with other specified complication [facility])   [facility] [place], [place], MD   1 month ago Type 1 diabetes mellitus with other specified complication [facility])   [place] [facility] [place], Odette Horns, MD   4 months ago Type 1 diabetes mellitus with other specified complication [facility])   [place] [facility] Hoy Register, MD       Future Appointments             In 2 months Hoy Register, MD [facility]

## 2022-10-25 ENCOUNTER — Ambulatory Visit: Payer: Medicaid Other | Admitting: Nutrition

## 2022-10-28 ENCOUNTER — Other Ambulatory Visit: Payer: Self-pay

## 2022-10-28 ENCOUNTER — Encounter (HOSPITAL_COMMUNITY): Payer: Self-pay

## 2022-10-28 ENCOUNTER — Inpatient Hospital Stay (HOSPITAL_COMMUNITY)
Admission: EM | Admit: 2022-10-28 | Discharge: 2022-10-30 | DRG: 392 | Disposition: A | Discharge status: Home / Self Care | Payer: Medicaid Other | Attending: Internal Medicine | Admitting: Internal Medicine

## 2022-10-28 DIAGNOSIS — E109 Type 1 diabetes mellitus without complications: Secondary | ICD-10-CM | POA: Diagnosis not present

## 2022-10-28 DIAGNOSIS — R112 Nausea with vomiting, unspecified: Secondary | ICD-10-CM | POA: Diagnosis not present

## 2022-10-28 DIAGNOSIS — Z794 Long term (current) use of insulin: Secondary | ICD-10-CM

## 2022-10-28 DIAGNOSIS — K219 Gastro-esophageal reflux disease without esophagitis: Secondary | ICD-10-CM | POA: Diagnosis present

## 2022-10-28 DIAGNOSIS — F12988 Cannabis use, unspecified with other cannabis-induced disorder: Secondary | ICD-10-CM | POA: Diagnosis present

## 2022-10-28 DIAGNOSIS — R1013 Epigastric pain: Secondary | ICD-10-CM | POA: Diagnosis not present

## 2022-10-28 DIAGNOSIS — E860 Dehydration: Secondary | ICD-10-CM | POA: Diagnosis present

## 2022-10-28 DIAGNOSIS — E119 Type 2 diabetes mellitus without complications: Secondary | ICD-10-CM

## 2022-10-28 DIAGNOSIS — R6510 Systemic inflammatory response syndrome (SIRS) of non-infectious origin without acute organ dysfunction: Secondary | ICD-10-CM | POA: Diagnosis present

## 2022-10-28 DIAGNOSIS — Z79899 Other long term (current) drug therapy: Secondary | ICD-10-CM

## 2022-10-28 DIAGNOSIS — D72829 Elevated white blood cell count, unspecified: Secondary | ICD-10-CM | POA: Diagnosis present

## 2022-10-28 DIAGNOSIS — Z87891 Personal history of nicotine dependence: Secondary | ICD-10-CM

## 2022-10-28 DIAGNOSIS — Z8719 Personal history of other diseases of the digestive system: Secondary | ICD-10-CM

## 2022-10-28 DIAGNOSIS — Z8249 Family history of ischemic heart disease and other diseases of the circulatory system: Secondary | ICD-10-CM

## 2022-10-28 DIAGNOSIS — R1110 Vomiting, unspecified: Principal | ICD-10-CM

## 2022-10-28 DIAGNOSIS — Z823 Family history of stroke: Secondary | ICD-10-CM

## 2022-10-28 DIAGNOSIS — E1065 Type 1 diabetes mellitus with hyperglycemia: Secondary | ICD-10-CM | POA: Diagnosis present

## 2022-10-28 LAB — RAPID URINE DRUG SCREEN, HOSP PERFORMED
Amphetamines: NOT DETECTED
Barbiturates: NOT DETECTED
Benzodiazepines: NOT DETECTED
Cocaine: NOT DETECTED
Opiates: POSITIVE — AB
Tetrahydrocannabinol: POSITIVE — AB

## 2022-10-28 LAB — URINALYSIS, ROUTINE W REFLEX MICROSCOPIC
Bacteria, UA: NONE SEEN
Bilirubin Urine: NEGATIVE
Glucose, UA: >=500 mg/dL — AB
Hgb urine dipstick: NEGATIVE
Ketones, ur: 80 mg/dL — AB
Leukocytes,Ua: NEGATIVE
Nitrite: NEGATIVE
Protein, ur: NEGATIVE mg/dL
Specific Gravity, Urine: 1.028 (ref 1.005–1.030)
pH: 5 (ref 5.0–8.0)

## 2022-10-28 LAB — CBC
HCT: 39 % (ref 39.0–52.0)
Hemoglobin: 13.4 g/dL (ref 13.0–17.0)
MCH: 30.7 pg (ref 26.0–34.0)
MCHC: 34.4 g/dL (ref 30.0–36.0)
MCV: 89.4 fL (ref 80.0–100.0)
Platelets: 226 10*3/uL (ref 150–400)
RBC: 4.36 MIL/uL (ref 4.22–5.81)
RDW: 11.9 % (ref 11.5–15.5)
WBC: 13.1 10*3/uL — ABNORMAL HIGH (ref 4.0–10.5)
nRBC: 0 % (ref 0.0–0.2)

## 2022-10-28 LAB — COMPREHENSIVE METABOLIC PANEL
ALT: 19 U/L (ref 0–44)
AST: 26 U/L (ref 15–41)
Albumin: 4.7 g/dL (ref 3.5–5.0)
Alkaline Phosphatase: 57 U/L (ref 38–126)
Anion gap: 13 (ref 5–15)
BUN: 19 mg/dL (ref 6–20)
CO2: 20 mmol/L — ABNORMAL LOW (ref 22–32)
Calcium: 9.3 mg/dL (ref 8.9–10.3)
Chloride: 104 mmol/L (ref 98–111)
Creatinine, Ser: 0.88 mg/dL (ref 0.61–1.24)
GFR, Estimated: >60 mL/min (ref >60)
Glucose, Bld: 316 mg/dL — ABNORMAL HIGH (ref 70–99)
Potassium: 3.8 mmol/L (ref 3.5–5.1)
Sodium: 137 mmol/L (ref 135–145)
Total Bilirubin: 1.1 mg/dL (ref 0.3–1.2)
Total Protein: 7.9 g/dL (ref 6.5–8.1)

## 2022-10-28 LAB — CBG MONITORING, ED: Glucose-Capillary: 286 mg/dL — ABNORMAL HIGH (ref 70–99)

## 2022-10-28 LAB — MAGNESIUM: Magnesium: 1.8 mg/dL (ref 1.7–2.4)

## 2022-10-28 LAB — LIPASE, BLOOD: Lipase: 24 U/L (ref 11–51)

## 2022-10-28 MED ORDER — ACETAMINOPHEN 325 MG PO TABS: 650.0000 mg | ORAL_TABLET | Freq: Four times a day (QID) | ORAL | Status: DC | PRN

## 2022-10-28 MED ORDER — SODIUM CHLORIDE 0.9 % IV BOLUS
1000.0000 mL | Freq: Once | INTRAVENOUS | Status: AC
  Administered 2022-10-28: 1000 mL via INTRAVENOUS

## 2022-10-28 MED ORDER — SODIUM CHLORIDE 0.9 % IV SOLN
12.5000 mg | Freq: Four times a day (QID) | INTRAVENOUS | Status: DC | PRN
  Administered 2022-10-28: 12.5 mg via INTRAVENOUS
  Filled 2022-10-28: qty 12.5

## 2022-10-28 MED ORDER — DICYCLOMINE HCL 10 MG PO CAPS: 10.0000 mg | ORAL_CAPSULE | Freq: Once | ORAL | Status: DC

## 2022-10-28 MED ORDER — INSULIN ASPART 100 UNIT/ML IJ SOLN
0.0000 [IU] | Freq: Three times a day (TID) | INTRAMUSCULAR | Status: DC
  Administered 2022-10-29 (×2): 7 [IU] via SUBCUTANEOUS
  Administered 2022-10-30: 3 [IU] via SUBCUTANEOUS
  Filled 2022-10-28: qty 0.09

## 2022-10-28 MED ORDER — INSULIN GLARGINE-YFGN 100 UNIT/ML ~~LOC~~ SOLN
18.0000 [IU] | Freq: Every day | SUBCUTANEOUS | Status: DC
  Administered 2022-10-28: 18 [IU] via SUBCUTANEOUS
  Filled 2022-10-28: qty 0.18

## 2022-10-28 MED ORDER — INSULIN ASPART 100 UNIT/ML IJ SOLN
0.0000 [IU] | Freq: Every day | INTRAMUSCULAR | Status: DC
  Administered 2022-10-28: 3 [IU] via SUBCUTANEOUS
  Filled 2022-10-28: qty 0.05

## 2022-10-28 MED ORDER — ONDANSETRON HCL 4 MG/2ML IJ SOLN
4.0000 mg | Freq: Once | INTRAMUSCULAR | Status: AC
  Administered 2022-10-28: 4 mg via INTRAVENOUS
  Filled 2022-10-28: qty 2

## 2022-10-28 MED ORDER — LACTATED RINGERS IV SOLN: INTRAVENOUS | Status: AC

## 2022-10-28 MED ORDER — LORAZEPAM 2 MG/ML IJ SOLN
1.0000 mg | INTRAMUSCULAR | Status: DC | PRN
  Administered 2022-10-29 – 2022-10-30 (×3): 1 mg via INTRAVENOUS
  Filled 2022-10-28 (×3): qty 1

## 2022-10-28 MED ORDER — ACETAMINOPHEN 650 MG RE SUPP: 650.0000 mg | Freq: Four times a day (QID) | RECTAL | Status: DC | PRN

## 2022-10-28 MED ORDER — MELATONIN 3 MG PO TABS: 3.0000 mg | ORAL_TABLET | Freq: Every evening | ORAL | Status: DC | PRN

## 2022-10-28 MED ORDER — ONDANSETRON HCL 4 MG/2ML IJ SOLN
4.0000 mg | Freq: Four times a day (QID) | INTRAMUSCULAR | Status: DC | PRN
  Administered 2022-10-29 – 2022-10-30 (×3): 4 mg via INTRAVENOUS
  Filled 2022-10-28 (×3): qty 2

## 2022-10-28 MED ORDER — DROPERIDOL 2.5 MG/ML IJ SOLN
2.5000 mg | Freq: Once | INTRAMUSCULAR | Status: AC
  Administered 2022-10-28: 2.5 mg via INTRAVENOUS
  Filled 2022-10-28: qty 2

## 2022-10-28 MED ORDER — MORPHINE SULFATE (PF) 2 MG/ML IV SOLN
2.0000 mg | Freq: Once | INTRAVENOUS | Status: AC
  Administered 2022-10-28: 2 mg via INTRAVENOUS
  Filled 2022-10-28: qty 1

## 2022-10-28 NOTE — H&P (Signed)
History and Physical      Shawn Maldonado ZOX:[id number] DOB: 21-Dec-1985 DOA: 10/28/2022; DOS: 10/28/2022  PCP: Hoy Register, MD *** Patient coming from: home ***  I have personally briefly reviewed patient's old medical records in [facility]  Chief Complaint: ***  HPI: Shawn Maldonado is a 37 y.o. male with medical history significant for *** who is admitted to [facility] on 10/28/2022 with *** after presenting from home*** to [facility] ED complaining of ***.   ***        ***  ED Course:  Vital signs in the ED were notable for the following: ***  Labs were notable for the following: ***  Per my interpretation, EKG in ED demonstrated the following:  ***  Imaging and additional notable ED work-up: ***  While in the ED, the following were administered: ***  Subsequently, the patient was admitted  ***  ***red   Review of Systems: As per HPI otherwise 10 point review of systems negative.   Past Medical History:  Diagnosis Date   Diabetes mellitus without complication (HCC)     Past Surgical History:  Procedure Laterality Date   MOUTH SURGERY      Social History:  reports that he has quit smoking. His smoking use included cigarettes. He has never used smokeless tobacco. He reports current alcohol use. He reports current drug use. Drug: Marijuana.   No Known Allergies  Family History  Problem Relation Age of Onset   Stroke Father    CAD Other     Family history reviewed and not pertinent ***   Prior to Admission medications   Medication Sig Start Date End Date Taking? Authorizing Provider  Continuous Blood Gluc Receiver (DEXCOM G7 RECEIVER) DEVI Check blood sugar three times daily. E10.69 05/31/22   Hoy Register, MD  Continuous Blood Gluc Sensor (DEXCOM G7 SENSOR) MISC Check blood sugar three times daily. Change sensors once every 10 days. E10.69 05/31/22   Hoy Register, MD  gabapentin (NEURONTIN) 300 MG capsule Take 1 capsule  (300 mg total) by mouth at bedtime. 05/31/22   Hoy Register, MD  insulin aspart (NOVOLOG) 100 UNIT/ML injection Inject 0-12 Units into the skin 3 (three) times daily before meals. Per sliding scale 08/30/22   Hoy Register, MD  insulin glargine (LANTUS) 100 UNIT/ML injection ADMINISTER 25 UNITS UNDER THE SKIN DAILY Patient taking differently: Inject 25 Units into the skin daily. 08/30/22   Hoy Register, MD  omeprazole (PRILOSEC) 40 MG capsule Take 1 capsule (40 mg total) by mouth daily. 08/30/22   Hoy Register, MD  ondansetron (ZOFRAN) 4 MG tablet Take 1 tablet (4 mg total) by mouth every 6 (six) hours as needed for nausea. 09/30/22   Jerald Kief, MD     Objective    Physical Exam: Vitals:   10/28/22 1323 10/28/22 1400 10/28/22 1706 10/28/22 1746  BP: 103/66 (!) 135/93 (!) 140/78   Pulse: 66 66 (!) 111   Resp: 18 19 20    Temp: 98.1 F (36.7 C)   98.4 F (36.9 C)  TempSrc: Oral   Oral  SpO2: 98% 98% 100%     General: appears to be stated age; alert, oriented Skin: warm, dry, no rash Head:  AT/[place] Mouth:  Oral mucosa membranes appear moist, normal dentition Neck: supple; trachea midline Heart:  RRR; did not appreciate any M/R/G Lungs: CTAB, did not appreciate any wheezes, rales, or rhonchi Abdomen: + BS; soft, ND, NT Vascular: 2+ pedal pulses b/l; 2+  radial pulses b/l Extremities: no peripheral edema, no muscle wasting Neuro: strength and sensation intact in upper and lower extremities b/l    *** Neuro: 5/5 strength of the proximal and distal flexors and extensors of the upper and lower extremities bilaterally; sensation intact in upper and lower extremities b/l; cranial nerves II through XII grossly intact; no pronator drift; no evidence suggestive of slurred speech, dysarthria, or facial droop; Normal muscle tone. No tremors. *** Neuro: In the setting of the patient's current mental status and associated inability to follow instructions, unable to perform full  neurologic exam at this time.  As such, assessment of strength, sensation, and cranial nerves is limited at this time. Patient noted to spontaneously move all 4 extremities. No tremors.  ***    Labs on Admission: I have personally reviewed following labs and imaging studies  CBC: Recent Labs  Lab 10/28/22 1348  WBC 13.1*  HGB 13.4  HCT 39.0  MCV 89.4  PLT 226   Basic Metabolic Panel: Recent Labs  Lab 10/28/22 1348  NA 137  K 3.8  CL 104  CO2 20*  GLUCOSE 316*  BUN 19  CREATININE 0.88  CALCIUM 9.3   GFR: CrCl cannot be calculated (Unknown ideal weight.). Liver Function Tests: Recent Labs  Lab 10/28/22 1348  AST 26  ALT 19  ALKPHOS 57  BILITOT 1.1  PROT 7.9  ALBUMIN 4.7   Recent Labs  Lab 10/28/22 1348  LIPASE 24   No results for input(s): "AMMONIA" in the last 168 hours. Coagulation Profile: No results for input(s): "INR", "PROTIME" in the last 168 hours. Cardiac Enzymes: No results for input(s): "CKTOTAL", "CKMB", "CKMBINDEX", "TROPONINI" in the last 168 hours. BNP (last 3 results) No results for input(s): "PROBNP" in the last 8760 hours. HbA1C: No results for input(s): "HGBA1C" in the last 72 hours. CBG: No results for input(s): "GLUCAP" in the last 168 hours. Lipid Profile: No results for input(s): "CHOL", "HDL", "LDLCALC", "TRIG", "CHOLHDL", "LDLDIRECT" in the last 72 hours. Thyroid Function Tests: No results for input(s): "TSH", "T4TOTAL", "FREET4", "T3FREE", "THYROIDAB" in the last 72 hours. Anemia Panel: No results for input(s): "VITAMINB12", "FOLATE", "FERRITIN", "TIBC", "IRON", "RETICCTPCT" in the last 72 hours. Urine analysis:    Component Value Date/Time   COLORURINE YELLOW 10/28/2022 1903   APPEARANCEUR CLEAR 10/28/2022 1903   LABSPEC 1.028 10/28/2022 1903   PHURINE 5.0 10/28/2022 1903   GLUCOSEU >=500 (A) 10/28/2022 1903   HGBUR NEGATIVE 10/28/2022 1903   BILIRUBINUR NEGATIVE 10/28/2022 1903   KETONESUR 80 (A) 10/28/2022 1903    PROTEINUR NEGATIVE 10/28/2022 1903   UROBILINOGEN 0.2 09/24/2012 1037   NITRITE NEGATIVE 10/28/2022 1903   LEUKOCYTESUR NEGATIVE 10/28/2022 1903    Radiological Exams on Admission: No results found.    Assessment/Plan    Principal Problem:   Intractable nausea and vomiting  ***      ***          ***               ***              ***              ***              ***              ***              ***              ***              ***              ***              ***              ***  DVT prophylaxis: SCD's ***  Code Status: Full code*** Family Communication: none*** Disposition Plan: Per Rounding Team Consults called: none***;  Admission status: ***    I SPENT GREATER THAN 75 *** MINUTES IN CLINICAL CARE TIME/MEDICAL DECISION-MAKING IN COMPLETING THIS ADMISSION.     Chaney Born Erdem Naas DO [facility] From 7PM - 7AM   10/28/2022, 10:00 PM   ***

## 2022-10-28 NOTE — ED Triage Notes (Signed)
Pt arrived via EMS, from home, c/o n/v and abd pain. States believes it is food poisoning.

## 2022-10-28 NOTE — ED Notes (Signed)
ED TO INPATIENT HANDOFF REPORT  ED Nurse Name and Phone #: Linus Orn Name/Age/Gender Shawn Maldonado 37 y.o. male Room/Bed: WA03/WA03  Code Status   Code Status: Full Code  Home/SNF/Other Home Patient oriented to: self, place, time, and situation Is this baseline? Yes   Triage Complete: Triage complete  Chief Complaint Intractable nausea and vomiting [R11.2]  Triage Note Pt arrived via EMS, from home, c/o n/v and abd pain. States believes it is food poisoning.    Allergies No Known Allergies  Level of Care/Admitting Diagnosis ED Disposition     ED Disposition  Admit   Condition  --   Comment  Hospital Area: [facility] [[id number]]  Level of Care: Telemetry [5]  Admit to tele based on following criteria: Monitor for Ischemic changes  May place patient in observation at [facility] or [facility] if equivalent level of care is available:: No  Covid Evaluation: Asymptomatic - no recent exposure (last 10 days) testing not required  Diagnosis: Intractable nausea and vomiting [720114]  Admitting Physician: Angie Fava [[id number]]  Attending Physician: Angie Fava [[id number]]          B Medical/Surgery History Past Medical History:  Diagnosis Date   Diabetes mellitus without complication (HCC)    Past Surgical History:  Procedure Laterality Date   MOUTH SURGERY       A IV Location/Drains/Wounds Patient Lines/Drains/Airways Status     Active Line/Drains/Airways     Name Placement date Placement time Site Days   Peripheral IV 10/28/22 20 G Left Antecubital 10/28/22  1604  Antecubital  less than 1            Intake/Output Last 24 hours No intake or output data in the 24 hours ending 10/28/22 2218  Labs/Imaging Results for orders placed or performed during the hospital encounter of 10/28/22 (from the past 48 hour(s))  Lipase, blood     Status: None   Collection Time: 10/28/22  1:48 PM  Result Value Ref Range    Lipase 24 11 - 51 U/L    Comment: Performed at [facility], [address], [place], [place] [address]  Comprehensive metabolic panel     Status: Abnormal   Collection Time: 10/28/22  1:48 PM  Result Value Ref Range   Sodium 137 135 - 145 mmol/L   Potassium 3.8 3.5 - 5.1 mmol/L   Chloride 104 98 - 111 mmol/L   CO2 20 (L) 22 - 32 mmol/L   Glucose, Bld 316 (H) 70 - 99 mg/dL    Comment: Glucose reference range applies only to samples taken after fasting for at least 8 hours.   BUN 19 6 - 20 mg/dL   Creatinine, Ser 9.52 0.61 - 1.24 mg/dL   Calcium 9.3 8.9 - 84.1 mg/dL   Total Protein 7.9 6.5 - 8.1 g/dL   Albumin 4.7 3.5 - 5.0 g/dL   AST 26 15 - 41 U/L   ALT 19 0 - 44 U/L   Alkaline Phosphatase 57 38 - 126 U/L   Total Bilirubin 1.1 0.3 - 1.2 mg/dL   GFR, Estimated >32 >44 mL/min    Comment: (NOTE) Calculated using the CKD-EPI Creatinine Equation (2021)    Anion gap 13 5 - 15    Comment: Performed at [facility], [address], [place], [place] [address]  CBC     Status: Abnormal   Collection Time: 10/28/22  1:48 PM  Result Value Ref Range  WBC 13.1 (H) 4.0 - 10.5 K/uL   RBC 4.36 4.22 - 5.81 MIL/uL   Hemoglobin 13.4 13.0 - 17.0 g/dL   HCT 54.0 98.1 - 19.1 %   MCV 89.4 80.0 - 100.0 fL   MCH 30.7 26.0 - 34.0 pg   MCHC 34.4 30.0 - 36.0 g/dL   RDW 47.8 29.5 - 62.1 %   Platelets 226 150 - 400 K/uL   nRBC 0.0 0.0 - 0.2 %    Comment: Performed at [facility], [address], [place], [place] [address]  Urinalysis, Routine w reflex microscopic -Urine, Clean Catch     Status: Abnormal   Collection Time: 10/28/22  7:03 PM  Result Value Ref Range   Color, Urine YELLOW YELLOW   APPearance CLEAR CLEAR   Specific Gravity, Urine 1.028 1.005 - 1.030   pH 5.0 5.0 - 8.0   Glucose, UA >=500 (A) NEGATIVE mg/dL   Hgb urine dipstick NEGATIVE NEGATIVE   Bilirubin Urine NEGATIVE NEGATIVE   Ketones, ur 80 (A) NEGATIVE mg/dL   Protein,  ur NEGATIVE NEGATIVE mg/dL   Nitrite NEGATIVE NEGATIVE   Leukocytes,Ua NEGATIVE NEGATIVE   RBC / HPF 0-5 0 - 5 RBC/hpf   WBC, UA 0-5 0 - 5 WBC/hpf   Bacteria, UA NONE SEEN NONE SEEN   Squamous Epithelial / HPF 0-5 0 - 5 /HPF   Mucus PRESENT     Comment: Performed at [facility], [address] [place], [place] [address]  Rapid urine drug screen (hospital performed)     Status: Abnormal   Collection Time: 10/28/22  8:57 PM  Result Value Ref Range   Opiates POSITIVE (A) NONE DETECTED   Cocaine NONE DETECTED NONE DETECTED   Benzodiazepines NONE DETECTED NONE DETECTED   Amphetamines NONE DETECTED NONE DETECTED   Tetrahydrocannabinol POSITIVE (A) NONE DETECTED   Barbiturates NONE DETECTED NONE DETECTED    Comment: (NOTE) DRUG SCREEN FOR MEDICAL PURPOSES ONLY.  IF CONFIRMATION IS NEEDED FOR ANY PURPOSE, NOTIFY LAB WITHIN 5 DAYS.  LOWEST DETECTABLE LIMITS FOR URINE DRUG SCREEN Drug Class                     Cutoff (ng/mL) Amphetamine and metabolites    1000 Barbiturate and metabolites    200 Benzodiazepine                 200 Opiates and metabolites        300 Cocaine and metabolites        300 THC                            50 Performed at [facility], [address], [place], [place] [address]    No results found.  Pending Labs Unresulted Labs (From admission, onward)     Start     Ordered   10/29/22 0500  CBC with Differential/Platelet  Tomorrow morning,   R        10/28/22 2155   10/29/22 0500  Comprehensive metabolic panel  Tomorrow morning,   R        10/28/22 2155   10/29/22 0500  Magnesium  Tomorrow morning,   R        10/28/22 2155   10/28/22 2155  Magnesium  Add-on,   AD        10/28/22 2155  Vitals/Pain Today's Vitals   10/28/22 1400 10/28/22 1706 10/28/22 1746 10/28/22 2100  BP: (!) 135/93 (!) 140/78    Pulse: 66 (!) 111    Resp: 19 20    Temp:   98.4 F (36.9 C)   TempSrc:   Oral   SpO2: 98%  100%    PainSc:    0-No pain    Isolation Precautions No active isolations  Medications Medications  acetaminophen (TYLENOL) tablet 650 mg (has no administration in time range)    Or  acetaminophen (TYLENOL) suppository 650 mg (has no administration in time range)  melatonin tablet 3 mg (has no administration in time range)  ondansetron (ZOFRAN) injection 4 mg (has no administration in time range)  LORazepam (ATIVAN) injection 1 mg (has no administration in time range)  insulin glargine-yfgn (SEMGLEE) injection 18 Units (has no administration in time range)  insulin aspart (novoLOG) injection 0-9 Units (has no administration in time range)  insulin aspart (novoLOG) injection 0-5 Units (has no administration in time range)  lactated ringers infusion (has no administration in time range)  sodium chloride 0.9 % bolus 1,000 mL (0 mLs Intravenous Stopped 10/28/22 2048)  ondansetron (ZOFRAN) injection 4 mg (4 mg Intravenous Given 10/28/22 1615)  morphine (PF) 2 MG/ML injection 2 mg (2 mg Intravenous Given 10/28/22 1615)  droperidol (INAPSINE) 2.5 MG/ML injection 2.5 mg (2.5 mg Intravenous Given 10/28/22 1615)  sodium chloride 0.9 % bolus 1,000 mL (1,000 mLs Intravenous New Bag/Given 10/28/22 2110)    Mobility walks     Focused Assessments Cardiac Assessment Handoff:    Lab Results  Component Value Date   TROPONINI <0.30 09/25/2012   No results found for: "DDIMER" Does the Patient currently have chest pain? No    R Recommendations: See Admitting Provider Note  Report given to:   Additional Notes: AAOx4, ambulates,

## 2022-10-28 NOTE — ED Provider Notes (Signed)
[place] EMERGENCY DEPARTMENT AT [facility] Provider Note   CSN: [id number] Arrival date & time: 10/28/22  1319     History  Chief Complaint  Patient presents with   Nausea   Emesis    Shawn Maldonado is a 37 y.o. male with past medical history significant for type 1 diabetes, gastroparesis, nausea, vomiting presents with concern for nausea, vomiting, abdominal pain since this morning.  Patient was concerned about possible food poisoning, endorsing that he had symptoms that only began this morning.  Patient reports that he had some pizza last night.  Patient denies any fever, chills, diarrhea.  He denies any hematemesis, hematochezia.  He denies any dysuria.  He reports that he took his normal insulin this morning.  He cannot tell me how his blood sugars been running high in general.   Emesis      Home Medications Prior to Admission medications   Medication Sig Start Date End Date Taking? Authorizing Provider  gabapentin (NEURONTIN) 300 MG capsule Take 1 capsule (300 mg total) by mouth at bedtime. 05/31/22  Yes Newlin, Odette Horns, MD  insulin aspart (NOVOLOG) 100 UNIT/ML injection Inject 0-12 Units into the skin 3 (three) times daily before meals. Per sliding scale 08/30/22  Yes Newlin, Enobong, MD  insulin glargine (LANTUS) 100 UNIT/ML injection ADMINISTER 25 UNITS UNDER THE SKIN DAILY Patient taking differently: Inject 25 Units into the skin daily. 08/30/22  Yes Hoy Register, MD  omeprazole (PRILOSEC) 40 MG capsule Take 1 capsule (40 mg total) by mouth daily. 08/30/22  Yes Newlin, Odette Horns, MD  ondansetron (ZOFRAN) 4 MG tablet Take 1 tablet (4 mg total) by mouth every 6 (six) hours as needed for nausea. 09/30/22  Yes Jerald Kief, MD  Continuous Blood Gluc Receiver (DEXCOM G7 RECEIVER) DEVI Check blood sugar three times daily. E10.69 05/31/22   Hoy Register, MD  Continuous Blood Gluc Sensor (DEXCOM G7 SENSOR) MISC Check blood sugar three times daily. Change sensors  once every 10 days. E10.69 05/31/22   Hoy Register, MD      Allergies    Patient has no known allergies.    Review of Systems   Review of Systems  Gastrointestinal:  Positive for vomiting.  All other systems reviewed and are negative.   Physical Exam Updated Vital Signs BP (!) 173/86   Pulse (!) 107   Temp 98.3 F (36.8 C) (Oral)   Resp 20   Ht 5\' 10"  (1.778 m)   Wt 54.5 kg   SpO2 100%   BMI 17.24 kg/m  Physical Exam Vitals and nursing note reviewed.  Constitutional:      General: He is not in acute distress.    Appearance: Normal appearance.     Comments: Patient somewhat uncomfortable, actively retching intermittently throughout initial interview, but nontoxic, nonseptic appearing.  HENT:     Head: Normocephalic and atraumatic.  Eyes:     General:        Right eye: No discharge.        Left eye: No discharge.  Cardiovascular:     Rate and Rhythm: Regular rhythm. Tachycardia present.     Heart sounds: No murmur heard.    No friction rub. No gallop.  Pulmonary:     Effort: Pulmonary effort is normal.     Breath sounds: Normal breath sounds.  Abdominal:     General: Bowel sounds are normal.     Palpations: Abdomen is soft.     Comments: Patient with some generalized  abdominal tenderness, most focally in epigastric region, no rebound, rigidity, guarding throughout.  No abdominal distention, bruising.  Normal bowel sounds.  Skin:    General: Skin is warm and dry.     Capillary Refill: Capillary refill takes less than 2 seconds.  Neurological:     Mental Status: He is alert and oriented to person, place, and time.  Psychiatric:        Mood and Affect: Mood normal.        Behavior: Behavior normal.     ED Results / Procedures / Treatments   Labs (all labs ordered are listed, but only abnormal results are displayed) Labs Reviewed  COMPREHENSIVE METABOLIC PANEL - Abnormal; Notable for the following components:      Result Value   CO2 20 (*)    Glucose, Bld  316 (*)    All other components within normal limits  CBC - Abnormal; Notable for the following components:   WBC 13.1 (*)    All other components within normal limits  URINALYSIS, ROUTINE W REFLEX MICROSCOPIC - Abnormal; Notable for the following components:   Glucose, UA >=500 (*)    Ketones, ur 80 (*)    All other components within normal limits  RAPID URINE DRUG SCREEN, HOSP PERFORMED - Abnormal; Notable for the following components:   Opiates POSITIVE (*)    Tetrahydrocannabinol POSITIVE (*)    All other components within normal limits  CBG MONITORING, ED - Abnormal; Notable for the following components:   Glucose-Capillary 286 (*)    All other components within normal limits  LIPASE, BLOOD  CBC WITH DIFFERENTIAL/PLATELET  COMPREHENSIVE METABOLIC PANEL  MAGNESIUM  MAGNESIUM    EKG None  Radiology No results found.  Procedures Procedures    Medications Ordered in ED Medications  acetaminophen (TYLENOL) tablet 650 mg (has no administration in time range)    Or  acetaminophen (TYLENOL) suppository 650 mg (has no administration in time range)  melatonin tablet 3 mg (has no administration in time range)  ondansetron (ZOFRAN) injection 4 mg (has no administration in time range)  LORazepam (ATIVAN) injection 1 mg (has no administration in time range)  insulin glargine-yfgn (SEMGLEE) injection 18 Units (18 Units Subcutaneous Given 10/28/22 2228)  insulin aspart (novoLOG) injection 0-9 Units (has no administration in time range)  insulin aspart (novoLOG) injection 0-5 Units (3 Units Subcutaneous Given 10/28/22 2240)  lactated ringers infusion ( Intravenous New Bag/Given 10/28/22 2241)  sodium chloride 0.9 % bolus 1,000 mL (0 mLs Intravenous Stopped 10/28/22 2048)  ondansetron (ZOFRAN) injection 4 mg (4 mg Intravenous Given 10/28/22 1615)  morphine (PF) 2 MG/ML injection 2 mg (2 mg Intravenous Given 10/28/22 1615)  droperidol (INAPSINE) 2.5 MG/ML injection 2.5 mg (2.5 mg  Intravenous Given 10/28/22 1615)  sodium chloride 0.9 % bolus 1,000 mL (1,000 mLs Intravenous New Bag/Given 10/28/22 2110)    ED Course/ Medical Decision Making/ A&P                             Medical Decision Making Amount and/or Complexity of Data Reviewed Labs: ordered.  Risk Prescription drug management. Decision regarding hospitalization.   This patient is a 37 y.o. male  who presents to the ED for concern of abdominal pain, nausea, vomiting.   Differential diagnoses prior to evaluation: The emergent differential diagnosis includes, but is not limited to,  The causes of generalized abdominal pain include but are not limited to AAA, mesenteric ischemia, appendicitis,  diverticulitis, DKA, gastritis, gastroenteritis, AMI, nephrolithiasis, pancreatitis, peritonitis, adrenal insufficiency,lead poisoning, iron toxicity, intestinal ischemia, constipation, UTI,SBO/LBO, splenic rupture, biliary disease, IBD, IBS, PUD, or hepatitis. This is not an exhaustive differential.   Past Medical History / Co-morbidities / Social History: Diabetes, diabetic gastroparesis  Additional history: Chart reviewed. Pertinent results include: Reviewed lab work, imaging from previous emergency department visits and hospitalizations  Physical Exam: Physical exam performed. The pertinent findings include: Patient clinically dehydrated, with intermittent tachycardia, somewhat ill-appearing, but nontoxic, nonseptic appearing  Lab Tests/Imaging studies: I personally interpreted labs/imaging and the pertinent results include: CBC is mildly elevated, white blood cells 13.1, CMP notable for mild bicarb deficit, CO2 20, elevated blood sugar, glucose 316, his UA with some ketones suggestive of dehydration.  UDS is pending at time of admission..  Considered CT abdomen pelvis given his intractable nausea and vomiting, however patient without significant focal tenderness to palpation of the abdomen on my exam.  I agree  with the radiologist interpretation.   Medications: I ordered medication including fluids, droperidol, morphine, Zofran.  I have reviewed the patients home medicines and have made adjustments as needed.  Consults: I spoke with the hospitalist, Dr. Arlean Hopping, and after discussion of relevant lab work, imaging, inability of patient to tolerate p.o., persistent intermittent tachycardia I recommend the patient be admitted for intractable nausea, vomiting, dehydration, Dr. Arlean Hopping agrees to plan   Disposition: After consideration of the diagnostic results and the patients response to treatment, I feel that patient would benefit from hospital admission .   I discussed this case with my attending physician who cosigned this note including patient's presenting symptoms, physical exam, and planned diagnostics and interventions. Attending physician stated agreement with plan or made changes to plan which were implemented.   Final Clinical Impression(s) / ED Diagnoses Final diagnoses:  Vomiting, unspecified vomiting type, unspecified whether nausea present    Rx / DC Orders ED Discharge Orders     None         [place] 10/28/22 2310    Ernie Avena, MD 10/28/22 2340

## 2022-10-29 ENCOUNTER — Encounter (HOSPITAL_COMMUNITY): Payer: Self-pay | Admitting: Internal Medicine

## 2022-10-29 DIAGNOSIS — Z87891 Personal history of nicotine dependence: Secondary | ICD-10-CM | POA: Diagnosis not present

## 2022-10-29 DIAGNOSIS — Z823 Family history of stroke: Secondary | ICD-10-CM | POA: Diagnosis not present

## 2022-10-29 DIAGNOSIS — K219 Gastro-esophageal reflux disease without esophagitis: Secondary | ICD-10-CM | POA: Diagnosis present

## 2022-10-29 DIAGNOSIS — Z794 Long term (current) use of insulin: Secondary | ICD-10-CM | POA: Diagnosis not present

## 2022-10-29 DIAGNOSIS — D72829 Elevated white blood cell count, unspecified: Secondary | ICD-10-CM | POA: Diagnosis present

## 2022-10-29 DIAGNOSIS — R112 Nausea with vomiting, unspecified: Secondary | ICD-10-CM | POA: Diagnosis present

## 2022-10-29 DIAGNOSIS — Z8719 Personal history of other diseases of the digestive system: Secondary | ICD-10-CM | POA: Diagnosis not present

## 2022-10-29 DIAGNOSIS — R1013 Epigastric pain: Secondary | ICD-10-CM | POA: Diagnosis present

## 2022-10-29 DIAGNOSIS — F12988 Cannabis use, unspecified with other cannabis-induced disorder: Secondary | ICD-10-CM | POA: Diagnosis present

## 2022-10-29 DIAGNOSIS — Z8249 Family history of ischemic heart disease and other diseases of the circulatory system: Secondary | ICD-10-CM | POA: Diagnosis not present

## 2022-10-29 DIAGNOSIS — E860 Dehydration: Secondary | ICD-10-CM | POA: Diagnosis present

## 2022-10-29 DIAGNOSIS — Z79899 Other long term (current) drug therapy: Secondary | ICD-10-CM | POA: Diagnosis not present

## 2022-10-29 DIAGNOSIS — E109 Type 1 diabetes mellitus without complications: Secondary | ICD-10-CM | POA: Diagnosis not present

## 2022-10-29 DIAGNOSIS — R6510 Systemic inflammatory response syndrome (SIRS) of non-infectious origin without acute organ dysfunction: Secondary | ICD-10-CM | POA: Diagnosis present

## 2022-10-29 DIAGNOSIS — E1065 Type 1 diabetes mellitus with hyperglycemia: Secondary | ICD-10-CM | POA: Diagnosis present

## 2022-10-29 DIAGNOSIS — R1110 Vomiting, unspecified: Secondary | ICD-10-CM

## 2022-10-29 LAB — CBC WITH DIFFERENTIAL/PLATELET
Abs Immature Granulocytes: 0.06 10*3/uL (ref 0.00–0.07)
Basophils Absolute: 0 10*3/uL (ref 0.0–0.1)
Basophils Relative: 0 %
Eosinophils Absolute: 0 10*3/uL (ref 0.0–0.5)
Eosinophils Relative: 0 %
HCT: 38.7 % — ABNORMAL LOW (ref 39.0–52.0)
Hemoglobin: 12.8 g/dL — ABNORMAL LOW (ref 13.0–17.0)
Immature Granulocytes: 0 %
Lymphocytes Relative: 12 %
Lymphs Abs: 1.8 10*3/uL (ref 0.7–4.0)
MCH: 30.5 pg (ref 26.0–34.0)
MCHC: 33.1 g/dL (ref 30.0–36.0)
MCV: 92.1 fL (ref 80.0–100.0)
Monocytes Absolute: 1.2 10*3/uL — ABNORMAL HIGH (ref 0.1–1.0)
Monocytes Relative: 8 %
Neutro Abs: 12.5 10*3/uL — ABNORMAL HIGH (ref 1.7–7.7)
Neutrophils Relative %: 80 %
Platelets: 207 10*3/uL (ref 150–400)
RBC: 4.2 MIL/uL — ABNORMAL LOW (ref 4.22–5.81)
RDW: 12.3 % (ref 11.5–15.5)
WBC: 15.6 10*3/uL — ABNORMAL HIGH (ref 4.0–10.5)
nRBC: 0 % (ref 0.0–0.2)

## 2022-10-29 LAB — COMPREHENSIVE METABOLIC PANEL
ALT: 19 U/L (ref 0–44)
AST: 19 U/L (ref 15–41)
Albumin: 4.4 g/dL (ref 3.5–5.0)
Alkaline Phosphatase: 62 U/L (ref 38–126)
Anion gap: 18 — ABNORMAL HIGH (ref 5–15)
BUN: 16 mg/dL (ref 6–20)
CO2: 16 mmol/L — ABNORMAL LOW (ref 22–32)
Calcium: 9.1 mg/dL (ref 8.9–10.3)
Chloride: 104 mmol/L (ref 98–111)
Creatinine, Ser: 0.85 mg/dL (ref 0.61–1.24)
GFR, Estimated: >60 mL/min (ref >60)
Glucose, Bld: 339 mg/dL — ABNORMAL HIGH (ref 70–99)
Potassium: 3.9 mmol/L (ref 3.5–5.1)
Sodium: 138 mmol/L (ref 135–145)
Total Bilirubin: 1.7 mg/dL — ABNORMAL HIGH (ref 0.3–1.2)
Total Protein: 7.1 g/dL (ref 6.5–8.1)

## 2022-10-29 LAB — TSH: TSH: 0.718 u[IU]/mL (ref 0.350–4.500)

## 2022-10-29 LAB — GLUCOSE, CAPILLARY
Glucose-Capillary: 331 mg/dL — ABNORMAL HIGH (ref 70–99)
Glucose-Capillary: 341 mg/dL — ABNORMAL HIGH (ref 70–99)
Glucose-Capillary: 110 mg/dL — ABNORMAL HIGH (ref 70–99)
Glucose-Capillary: 148 mg/dL — ABNORMAL HIGH (ref 70–99)

## 2022-10-29 LAB — MAGNESIUM
Magnesium: 1.9 mg/dL (ref 1.7–2.4)
Magnesium: 1.8 mg/dL (ref 1.7–2.4)

## 2022-10-29 MED ORDER — NALOXONE HCL 0.4 MG/ML IJ SOLN: 0.4000 mg | INTRAMUSCULAR | Status: DC | PRN

## 2022-10-29 MED ORDER — HYDROMORPHONE HCL 1 MG/ML IJ SOLN: 0.5000 mg | INTRAMUSCULAR | Status: DC | PRN

## 2022-10-29 MED ORDER — KETOROLAC TROMETHAMINE 15 MG/ML IJ SOLN: 15.0000 mg | Freq: Three times a day (TID) | INTRAMUSCULAR | Status: DC | PRN

## 2022-10-29 MED ORDER — METOCLOPRAMIDE HCL 5 MG/ML IJ SOLN
5.0000 mg | Freq: Four times a day (QID) | INTRAMUSCULAR | Status: DC
  Administered 2022-10-29 – 2022-10-30 (×2): 5 mg via INTRAVENOUS
  Filled 2022-10-29 (×3): qty 2

## 2022-10-29 MED ORDER — POTASSIUM CHLORIDE 10 MEQ/100ML IV SOLN: 10.0000 meq | INTRAVENOUS | Status: DC

## 2022-10-29 MED ORDER — INSULIN GLARGINE-YFGN 100 UNIT/ML ~~LOC~~ SOLN
10.0000 [IU] | Freq: Two times a day (BID) | SUBCUTANEOUS | Status: DC
  Administered 2022-10-29 – 2022-10-30 (×3): 10 [IU] via SUBCUTANEOUS
  Filled 2022-10-29 (×4): qty 0.1

## 2022-10-29 MED ORDER — MAGNESIUM SULFATE 2 GM/50ML IV SOLN: 2.0000 g | Freq: Once | INTRAVENOUS | Status: DC

## 2022-10-29 MED ORDER — PANTOPRAZOLE SODIUM 40 MG IV SOLR: 40.0000 mg | INTRAVENOUS | Status: DC

## 2022-10-29 MED ORDER — PANTOPRAZOLE SODIUM 40 MG IV SOLR
40.0000 mg | INTRAVENOUS | Status: DC
  Administered 2022-10-29: 40 mg via INTRAVENOUS
  Filled 2022-10-29: qty 10

## 2022-10-29 MED ORDER — INSULIN ASPART 100 UNIT/ML IJ SOLN
3.0000 [IU] | Freq: Three times a day (TID) | INTRAMUSCULAR | Status: DC
  Administered 2022-10-29 – 2022-10-30 (×2): 3 [IU] via SUBCUTANEOUS

## 2022-10-29 NOTE — Progress Notes (Signed)
[facility] PROGRESS NOTE    Progress Note  Shawn Maldonado  ZOX:[id number] DOB: 10-17-1985 DOA: 10/28/2022 PCP: Hoy Register, MD     Brief Narrative:   Shawn Maldonado is an 37 y.o. male past medical history of type 1 diabetes mellitus with gastroparesis comes to the [facility] long ED for 2 days of persistent nausea and vomiting, decreased oral intake no hematemesis no fevers acknowledges intermittent use of cannabis no alcohol consumption   Assessment/Plan:   Intractable nausea and vomiting: In the setting of history of diabetes gastroparesis, and cannabis use, UDS was positive for cannabis. Abnormal gastric emptying study on 10/23/2022 Was started on IV fluids, and Zofran. Monitor strict I's and O's and daily weights try to keep potassium greater than 4 magnesium greater than 2. Will start him on IV Reglan.  Check the potassium greater than 4 magnesium greater than 2.  Epigastric abdominal pain: Likely due to vomiting. Avoid narcotics Protonix IV twice daily. Continue IV fluids. Will allow regular liquid diet advance as tolerated.  Leukocytosis: Has remained afebrile likely reactive no signs of infection.  Diabetes mellitus (HCC) type 1 insulin-dependent: With an A1c of 9.4. Start him on sliding scale, continue long-acting insulin.  GERD (gastroesophageal reflux disease) Continue PPI.   DVT prophylaxis: lovenox Family Communication:none Status is: Observation The patient remains OBS appropriate and will d/c before 2 midnights.    Code Status:     Code Status Orders  (From admission, onward)           Start     Ordered   10/28/22 2154  Full code  Continuous       Question:  By:  Answer:  Consent: discussion documented in EHR   10/28/22 2154           Code Status History     Date Active Date Inactive Code Status Order ID Comments User Context   09/29/2022 2013 09/30/2022 2244 Full Code [id number]  Briscoe Deutscher, MD ED   01/19/2020  2213 01/21/2020 2159 Full Code [id number]  Theotis Barrio, MD ED   02/10/2019 2138 02/14/2019 2216 Full Code [id number]  Merlene Laughter, DO ED   07/27/2018 1553 07/30/2018 1602 Full Code [id number]  Jonah Blue, MD ED   10/18/2017 1154 10/20/2017 1622 Full Code [id number]  Noralee Stain, DO Inpatient   05/16/2017 2255 05/21/2017 2118 Full Code [id number]  Clydie Braun, MD ED   08/14/2015 0137 08/19/2015 1612 Full Code [id number]  Briscoe Deutscher, MD ED   04/30/2015 1440 05/02/2015 2201 Full Code [id number]  Rama, Maryruth Bun, MD Inpatient   09/24/2012 1253 09/26/2012 1301 Full Code [id number]  Oti, Osie Cheeks, MD ED         IV Access:   Peripheral IV   Procedures and diagnostic studies:   No results found.   Medical Consultants:   None.   Subjective:    Shawn Maldonado having hiccups hungry but still vomiting we will like to try diet.  Objective:    Vitals:   10/28/22 1746 10/28/22 2240 10/29/22 0252 10/29/22 0622  BP:  (!) 173/86 (!) 109/56 124/71  Pulse:  (!) 107 75 (!) 101  Resp:   18 18  Temp: 98.4 F (36.9 C) 98.3 F (36.8 C) 98 F (36.7 C) 98 F (36.7 C)  TempSrc: Oral Oral Oral Oral  SpO2:  100% 99% 100%  Weight:  54.5 kg  54 kg  Height:  5\' 10"  (1.778 m)  SpO2: 100 %   Intake/Output Summary (Last 24 hours) at 10/29/2022 0818 Last data filed at 10/29/2022 0300 Gross per 24 hour  Intake 641.03 ml  Output --  Net 641.03 ml   Filed Weights   10/28/22 2240 10/29/22 0622  Weight: 54.5 kg 54 kg    Exam: General exam: In no acute distress. Respiratory system: Good air movement and clear to auscultation. Cardiovascular system: S1 & S2 heard, RRR. No JVD. Gastrointestinal system: Abdomen is nondistended, soft and nontender.  Extremities: No pedal edema. Skin: No rashes, lesions or ulcers Psychiatry: Judgement and insight appear normal. Mood & affect appropriate.    Data Reviewed:    Labs: Basic Metabolic Panel: Recent Labs  Lab 10/28/22 1348  10/28/22 2238  NA 137  --   K 3.8  --   CL 104  --   CO2 20*  --   GLUCOSE 316*  --   BUN 19  --   CREATININE 0.88  --   CALCIUM 9.3  --   MG  --  1.8   GFR Estimated Creatinine Clearance: 87.8 mL/min (by C-G formula based on SCr of 0.88 mg/dL). Liver Function Tests: Recent Labs  Lab 10/28/22 1348  AST 26  ALT 19  ALKPHOS 57  BILITOT 1.1  PROT 7.9  ALBUMIN 4.7   Recent Labs  Lab 10/28/22 1348  LIPASE 24   No results for input(s): "AMMONIA" in the last 168 hours. Coagulation profile No results for input(s): "INR", "PROTIME" in the last 168 hours. COVID-19 Labs  No results for input(s): "DDIMER", "FERRITIN", "LDH", "CRP" in the last 72 hours.  Lab Results  Component Value Date   SARSCOV2NAA POSITIVE (A) 09/30/2022   SARSCOV2NAA Detected (A) 05/21/2020   SARSCOV2NAA NEGATIVE 01/20/2020   SARSCOV2NAA NEGATIVE 02/10/2019    CBC: Recent Labs  Lab 10/28/22 1348 10/29/22 0643  WBC 13.1* 15.6*  NEUTROABS  --  12.5*  HGB 13.4 12.8*  HCT 39.0 38.7*  MCV 89.4 92.1  PLT 226 207   Cardiac Enzymes: No results for input(s): "CKTOTAL", "CKMB", "CKMBINDEX", "TROPONINI" in the last 168 hours. BNP (last 3 results) No results for input(s): "PROBNP" in the last 8760 hours. CBG: Recent Labs  Lab 10/28/22 2227 10/29/22 0749  GLUCAP 286* 331*   D-Dimer: No results for input(s): "DDIMER" in the last 72 hours. Hgb A1c: No results for input(s): "HGBA1C" in the last 72 hours. Lipid Profile: No results for input(s): "CHOL", "HDL", "LDLCALC", "TRIG", "CHOLHDL", "LDLDIRECT" in the last 72 hours. Thyroid function studies: No results for input(s): "TSH", "T4TOTAL", "T3FREE", "THYROIDAB" in the last 72 hours.  Invalid input(s): "FREET3" Anemia work up: No results for input(s): "VITAMINB12", "FOLATE", "FERRITIN", "TIBC", "IRON", "RETICCTPCT" in the last 72 hours. Sepsis Labs: Recent Labs  Lab 10/28/22 1348 10/29/22 0643  WBC 13.1* 15.6*   Microbiology No results  found for this or any previous visit (from the past 240 hour(s)).   Medications:    insulin aspart  0-5 Units Subcutaneous QHS   insulin aspart  0-9 Units Subcutaneous TID WC   insulin glargine-yfgn  18 Units Subcutaneous QHS   pantoprazole (PROTONIX) IV  40 mg Intravenous Q24H   Continuous Infusions:  lactated ringers 150 mL/hr at 10/29/22 0508      LOS: 0 days   Marinda Elk  [facility]  10/29/2022, 8:18 AM

## 2022-10-29 NOTE — Plan of Care (Signed)
Pt alert and oriented x 4. Pt did not require antiemetics until early am. Zofran given and was not effective. Ativan given. At [phone]. Vitals stable. With movement heart rate tachy  Problem: Education: Goal: Ability to describe self-care measures that may prevent or decrease complications (Diabetes Survival Skills Education) will improve Outcome: Progressing Goal: Individualized Educational Video(s) Outcome: Progressing   Problem: Coping: Goal: Ability to adjust to condition or change in health will improve Outcome: Progressing   Problem: Fluid Volume: Goal: Ability to maintain a balanced intake and output will improve Outcome: Progressing   Problem: Health Behavior/Discharge Planning: Goal: Ability to identify and utilize available resources and services will improve Outcome: Progressing Goal: Ability to manage health-related needs will improve Outcome: Progressing   Problem: Metabolic: Goal: Ability to maintain appropriate glucose levels will improve Outcome: Progressing   Problem: Nutritional: Goal: Maintenance of adequate nutrition will improve Outcome: Progressing Goal: Progress toward achieving an optimal weight will improve Outcome: Progressing   Problem: Skin Integrity: Goal: Risk for impaired skin integrity will decrease Outcome: Progressing   Problem: Tissue Perfusion: Goal: Adequacy of tissue perfusion will improve Outcome: Progressing   Problem: Education: Goal: Knowledge of General Education information will improve Description: Including pain rating scale, medication(s)/side effects and non-pharmacologic comfort measures Outcome: Progressing   Problem: Health Behavior/Discharge Planning: Goal: Ability to manage health-related needs will improve Outcome: Progressing   Problem: Clinical Measurements: Goal: Ability to maintain clinical measurements within normal limits will improve Outcome: Progressing Goal: Will remain free from infection Outcome:  Progressing Goal: Diagnostic test results will improve Outcome: Progressing Goal: Respiratory complications will improve Outcome: Progressing Goal: Cardiovascular complication will be avoided Outcome: Progressing   Problem: Activity: Goal: Risk for activity intolerance will decrease Outcome: Progressing   Problem: Nutrition: Goal: Adequate nutrition will be maintained Outcome: Progressing   Problem: Coping: Goal: Level of anxiety will decrease Outcome: Progressing   Problem: Elimination: Goal: Will not experience complications related to bowel motility Outcome: Progressing Goal: Will not experience complications related to urinary retention Outcome: Progressing   Problem: Pain Managment: Goal: General experience of comfort will improve Outcome: Progressing   Problem: Safety: Goal: Ability to remain free from injury will improve Outcome: Progressing   Problem: Skin Integrity: Goal: Risk for impaired skin integrity will decrease Outcome: Progressing

## 2022-10-30 ENCOUNTER — Other Ambulatory Visit: Payer: Self-pay | Admitting: Family Medicine

## 2022-10-30 ENCOUNTER — Ambulatory Visit: Payer: Medicaid Other | Admitting: Nutrition

## 2022-10-30 DIAGNOSIS — E1069 Type 1 diabetes mellitus with other specified complication: Secondary | ICD-10-CM

## 2022-10-30 LAB — GLUCOSE, CAPILLARY: Glucose-Capillary: 220 mg/dL — ABNORMAL HIGH (ref 70–99)

## 2022-10-30 MED ORDER — OMEPRAZOLE 40 MG PO CPDR
40.0000 mg | DELAYED_RELEASE_CAPSULE | Freq: Every day | ORAL | 1 refills | Status: DC
Start: 2022-10-30 — End: 2023-04-12

## 2022-10-30 MED ORDER — PANTOPRAZOLE SODIUM 40 MG IV SOLR
40.0000 mg | Freq: Two times a day (BID) | INTRAVENOUS | Status: DC
  Administered 2022-10-30: 40 mg via INTRAVENOUS
  Filled 2022-10-30: qty 10

## 2022-10-30 NOTE — Telephone Encounter (Signed)
Medication Refill - Medication: insulin glargine (LANTUS) 100 UNIT/ML injection   Has the patient contacted their pharmacy? No.  Preferred Pharmacy (with phone number or street name):  [id number] - [place], [place] - [address] AT [facility] [address] Phone: [phone]  Fax: [phone]     Has the patient been seen for an appointment in the last year OR does the patient have an upcoming appointment? No.  Agent: Please be advised that RX refills may take up to 3 business days. We ask that you follow-up with your pharmacy.

## 2022-10-30 NOTE — Inpatient Diabetes Management (Signed)
Inpatient Diabetes Program Recommendations  AACE/ADA: New Consensus Statement on Inpatient Glycemic Control (2015)  Target Ranges:  Prepandial:   less than 140 mg/dL      Peak postprandial:   less than 180 mg/dL (1-2 hours)      Critically ill patients:  140 - 180 mg/dL   Lab Results  Component Value Date   GLUCAP 220 (H) 10/30/2022   HGBA1C 9.4 (H) 09/30/2022    Review of Glycemic Control  Diabetes history: DM1 Outpatient Diabetes medications: Lantus 25 every day, Novolog 0-12 TID Current orders for Inpatient glycemic control: Semglee 10 BID, Novolog 0-9 units TID with meals and 0-5 HS + 3 units TID  HgbA1C - 9.4%  Inpatient Diabetes Program Recommendations:    Met with pt at bedside regarding his HgbA1C of 9.4%. (average blood sugar of 223 mg/dL). Pt states he has Dexcom and needs to focus on improving his blood sugars. States he has appt with Nutritionist on 7/3, and then will f/u with PCP. Denies any hypoglycemia. Discussed s/s and treatment. Gives insulin in abdomen. Tries to eat healthy meals, although has food indiscretions. Discussed portion control, protein at each meal and eat high fiber carbohydrates. Explained how hyperglycemia can lead to damage within blood vessels which lead to complications seen in uncontrolled DM. Discussed impact of nutrition, exercise, stress, sickness, and medications on diabetes control. Answered all questions. Pt seems motivated to improve his diabetes control.  Appreciative of visit.  Thank you. Ailene Ards, RD, LDN, CDCES Inpatient Diabetes Coordinator [phone]

## 2022-10-30 NOTE — Progress Notes (Signed)
   10/30/22 0958  TOC Brief Assessment  Insurance and Status Reviewed  Patient has primary care physician Yes  Home environment has been reviewed Home w/ s/o  Prior level of function: Independent  Prior/Current Home Services No current home services  Social Determinants of Health Reivew SDOH reviewed no interventions necessary  Readmission risk has been reviewed Yes  Transition of care needs no transition of care needs at this time

## 2022-10-30 NOTE — Progress Notes (Signed)
Patient provided with discharge education, patient verbalized understanding. IV removed.  

## 2022-10-30 NOTE — Discharge Summary (Signed)
Physician Discharge Summary  Shawn Maldonado VHQ:[id number] DOB: 06-07-1985 DOA: 10/28/2022  PCP: Hoy Register, MD  Admit date: 10/28/2022 Discharge date: 10/30/2022  Admitted From: Home Disposition:  Home  Recommendations for Outpatient Follow-up:  Follow up with PCP in 1-2 weeks Please obtain BMP/CBC in one week   Home Health:No Equipment/Devices:None  Discharge Condition:Stable CODE STATUS:Full Diet recommendation: Heart Healthy   Brief/Interim Summary: 37 y.o. male past medical history of type 1 diabetes mellitus with gastroparesis comes to the [facility] long ED for 2 days of persistent nausea and vomiting, decreased oral intake no hematemesis no fevers acknowledges intermittent use of cannabis no alcohol consumption   Discharge Diagnoses:  Principal Problem:   Intractable nausea and vomiting Active Problems:   Diabetes mellitus (HCC)   SIRS (systemic inflammatory response syndrome) (HCC)   Epigastric pain   Dehydration   GERD (gastroesophageal reflux disease)   Cannabinoid hyperemesis syndrome  Intractable nausea and vomiting likely due to cannabis hyperemesis syndrome: Gastric emptying study in 10/23/2022 was negative. UDS was positive for cannabis. He was started on IV fluids Zofran and Reglan. By the next day his nausea and vomiting resolved he was tolerating his diet he was discharged in stable condition.  Epigastric abdominal pain: Likely due to vomiting has a history of Mallory-Weiss tears no signs of bleeding. We avoided narcotics he was placed on ketorolac Protonix IV twice a day his abdominal pain resolved he was able to tolerate his diet.  Leukocytosis: Likely reactive now resolved.  Diabetes mellitus type 2 uncontrolled: No changes made to his medication he will continue insulin as an outpatient. A1c is 9.4.  GERD: Continue PPI.  Discharge Instructions  Discharge Instructions     Diet - low sodium heart healthy   Complete by: As directed     Increase activity slowly   Complete by: As directed       Allergies as of 10/30/2022   No Known Allergies      Medication List     TAKE these medications    Dexcom G7 Receiver Devi Check blood sugar three times daily. E10.69   Dexcom G7 Sensor Misc Check blood sugar three times daily. Change sensors once every 10 days. E10.69   gabapentin 300 MG capsule Commonly known as: NEURONTIN Take 1 capsule (300 mg total) by mouth at bedtime.   insulin aspart 100 UNIT/ML injection Commonly known as: novoLOG Inject 0-12 Units into the skin 3 (three) times daily before meals. Per sliding scale   insulin glargine 100 UNIT/ML injection Commonly known as: Lantus ADMINISTER 25 UNITS UNDER THE SKIN DAILY What changed:  how much to take how to take this when to take this additional instructions   omeprazole 40 MG capsule Commonly known as: PRILOSEC Take 1 capsule (40 mg total) by mouth daily.   ondansetron 4 MG tablet Commonly known as: ZOFRAN Take 1 tablet (4 mg total) by mouth every 6 (six) hours as needed for nausea.        No Known Allergies  Consultations: None  Procedures/Studies: NM Gastric Emptying  Result Date: 10/23/2022 CLINICAL DATA:  Rule out gastroparesis.  Diabetic. EXAM: NUCLEAR MEDICINE GASTRIC EMPTYING SCAN TECHNIQUE: After oral ingestion of radiolabeled meal, sequential abdominal images were obtained for 4 hours. Percentage of activity emptying the stomach was calculated at 1 hour, 2 hour, 3 hour, and 4 hours. RADIOPHARMACEUTICALS:  2.1 mCi Tc-25m sulfur colloid in standardized meal COMPARISON:  None Available. FINDINGS: Expected location of the stomach in the left upper quadrant. Ingested  meal empties the stomach gradually over the course of the study. 60% emptied at 1 hr ( normal >= 10%) 79% emptied at 2 hr ( normal >= 40%) 86% emptied at 3 hr ( normal >= 70%) 93% emptied at 4 hr ( normal >= 90%) IMPRESSION: Normal gastric emptying study. Electronically  Signed   By: Gerome Sam III M.D.   On: 10/23/2022 14:30   (Echo, Carotid, EGD, Colonoscopy, ERCP)    Subjective: No complaints  Discharge Exam: Vitals:   10/29/22 1931 10/30/22 0617  BP: 124/79 121/69  Pulse: 81 60  Resp: 16 18  Temp: 99 F (37.2 C) 98.7 F (37.1 C)  SpO2: 100% 94%   Vitals:   10/29/22 0622 10/29/22 1120 10/29/22 1931 10/30/22 0617  BP: 124/71 (!) 132/58 124/79 121/69  Pulse: (!) 101 (!) 105 81 60  Resp: 18 20 16 18   Temp: 98 F (36.7 C) 99 F (37.2 C) 99 F (37.2 C) 98.7 F (37.1 C)  TempSrc: Oral  Oral Oral  SpO2: 100% 98% 100% 94%  Weight: 54 kg     Height:        General: Pt is alert, awake, not in acute distress Cardiovascular: RRR, S1/S2 +, no rubs, no gallops Respiratory: CTA bilaterally, no wheezing, no rhonchi Abdominal: Soft, NT, ND, bowel sounds + Extremities: no edema, no cyanosis    The results of significant diagnostics from this hospitalization (including imaging, microbiology, ancillary and laboratory) are listed below for reference.     Microbiology: No results found for this or any previous visit (from the past 240 hour(s)).   Labs: BNP (last 3 results) No results for input(s): "BNP" in the last 8760 hours. Basic Metabolic Panel: Recent Labs  Lab 10/28/22 1348 10/28/22 2238 10/29/22 0643 10/29/22 1007  NA 137  --  138  --   K 3.8  --  3.9  --   CL 104  --  104  --   CO2 20*  --  16*  --   GLUCOSE 316*  --  339*  --   BUN 19  --  16  --   CREATININE 0.88  --  0.85  --   CALCIUM 9.3  --  9.1  --   MG  --  1.8 1.9 1.8   Liver Function Tests: Recent Labs  Lab 10/28/22 1348 10/29/22 0643  AST 26 19  ALT 19 19  ALKPHOS 57 62  BILITOT 1.1 1.7*  PROT 7.9 7.1  ALBUMIN 4.7 4.4   Recent Labs  Lab 10/28/22 1348  LIPASE 24   No results for input(s): "AMMONIA" in the last 168 hours. CBC: Recent Labs  Lab 10/28/22 1348 10/29/22 0643  WBC 13.1* 15.6*  NEUTROABS  --  12.5*  HGB 13.4 12.8*  HCT 39.0  38.7*  MCV 89.4 92.1  PLT 226 207   Cardiac Enzymes: No results for input(s): "CKTOTAL", "CKMB", "CKMBINDEX", "TROPONINI" in the last 168 hours. BNP: Invalid input(s): "POCBNP" CBG: Recent Labs  Lab 10/28/22 2227 10/29/22 0749 10/29/22 1128 10/29/22 1700 10/29/22 2015  GLUCAP 286* 331* 341* 110* 148*   D-Dimer No results for input(s): "DDIMER" in the last 72 hours. Hgb A1c No results for input(s): "HGBA1C" in the last 72 hours. Lipid Profile No results for input(s): "CHOL", "HDL", "LDLCALC", "TRIG", "CHOLHDL", "LDLDIRECT" in the last 72 hours. Thyroid function studies Recent Labs    10/29/22 0643  TSH 0.718   Anemia work up No results for input(s): "VITAMINB12", "FOLATE", "FERRITIN", "  TIBC", "IRON", "RETICCTPCT" in the last 72 hours. Urinalysis    Component Value Date/Time   COLORURINE YELLOW 10/28/2022 1903   APPEARANCEUR CLEAR 10/28/2022 1903   LABSPEC 1.028 10/28/2022 1903   PHURINE 5.0 10/28/2022 1903   GLUCOSEU >=500 (A) 10/28/2022 1903   HGBUR NEGATIVE 10/28/2022 1903   BILIRUBINUR NEGATIVE 10/28/2022 1903   KETONESUR 80 (A) 10/28/2022 1903   PROTEINUR NEGATIVE 10/28/2022 1903   UROBILINOGEN 0.2 09/24/2012 1037   NITRITE NEGATIVE 10/28/2022 1903   LEUKOCYTESUR NEGATIVE 10/28/2022 1903   Sepsis Labs Recent Labs  Lab 10/28/22 1348 10/29/22 0643  WBC 13.1* 15.6*   Microbiology No results found for this or any previous visit (from the past 240 hour(s)).   Time coordinating discharge: Over 30 minutes  SIGNED:   Marinda Elk, MD  [facility] 10/30/2022, 7:20 AM Pager   If 7PM-7AM, please contact night-coverage [email or web address] Password [email or web address]

## 2022-10-31 ENCOUNTER — Encounter (HOSPITAL_COMMUNITY): Payer: Self-pay | Admitting: Emergency Medicine

## 2022-10-31 ENCOUNTER — Emergency Department (HOSPITAL_COMMUNITY)
Admission: EM | Admit: 2022-10-31 | Discharge: 2022-10-31 | Disposition: A | Discharge status: Home / Self Care | Payer: Medicaid Other | Attending: Emergency Medicine | Admitting: Emergency Medicine

## 2022-10-31 ENCOUNTER — Telehealth: Payer: Self-pay

## 2022-10-31 ENCOUNTER — Other Ambulatory Visit: Payer: Self-pay

## 2022-10-31 DIAGNOSIS — R1084 Generalized abdominal pain: Secondary | ICD-10-CM | POA: Insufficient documentation

## 2022-10-31 DIAGNOSIS — R109 Unspecified abdominal pain: Secondary | ICD-10-CM | POA: Diagnosis present

## 2022-10-31 DIAGNOSIS — Z794 Long term (current) use of insulin: Secondary | ICD-10-CM | POA: Diagnosis not present

## 2022-10-31 DIAGNOSIS — R112 Nausea with vomiting, unspecified: Secondary | ICD-10-CM | POA: Insufficient documentation

## 2022-10-31 DIAGNOSIS — E109 Type 1 diabetes mellitus without complications: Secondary | ICD-10-CM | POA: Diagnosis not present

## 2022-10-31 LAB — CBC WITH DIFFERENTIAL/PLATELET
Abs Immature Granulocytes: 0.04 10*3/uL (ref 0.00–0.07)
Basophils Absolute: 0.1 10*3/uL (ref 0.0–0.1)
Basophils Relative: 1 %
Eosinophils Absolute: 0 10*3/uL (ref 0.0–0.5)
Eosinophils Relative: 0 %
HCT: 38.2 % — ABNORMAL LOW (ref 39.0–52.0)
Hemoglobin: 13.2 g/dL (ref 13.0–17.0)
Immature Granulocytes: 0 %
Lymphocytes Relative: 11 %
Lymphs Abs: 1.3 10*3/uL (ref 0.7–4.0)
MCH: 30.3 pg (ref 26.0–34.0)
MCHC: 34.6 g/dL (ref 30.0–36.0)
MCV: 87.8 fL (ref 80.0–100.0)
Monocytes Absolute: 0.7 10*3/uL (ref 0.1–1.0)
Monocytes Relative: 6 %
Neutro Abs: 9.6 10*3/uL — ABNORMAL HIGH (ref 1.7–7.7)
Neutrophils Relative %: 82 %
Platelets: 191 10*3/uL (ref 150–400)
RBC: 4.35 MIL/uL (ref 4.22–5.81)
RDW: 11.8 % (ref 11.5–15.5)
WBC: 11.7 10*3/uL — ABNORMAL HIGH (ref 4.0–10.5)
nRBC: 0 % (ref 0.0–0.2)

## 2022-10-31 LAB — URINALYSIS, ROUTINE W REFLEX MICROSCOPIC
Bacteria, UA: NONE SEEN
Bilirubin Urine: NEGATIVE
Glucose, UA: >=500 mg/dL — AB
Hgb urine dipstick: NEGATIVE
Ketones, ur: 80 mg/dL — AB
Leukocytes,Ua: NEGATIVE
Nitrite: NEGATIVE
Protein, ur: NEGATIVE mg/dL
Specific Gravity, Urine: 1.017 (ref 1.005–1.030)
pH: 6 (ref 5.0–8.0)

## 2022-10-31 LAB — COMPREHENSIVE METABOLIC PANEL
ALT: 27 U/L (ref 0–44)
AST: 32 U/L (ref 15–41)
Albumin: 4.7 g/dL (ref 3.5–5.0)
Alkaline Phosphatase: 56 U/L (ref 38–126)
Anion gap: 14 (ref 5–15)
BUN: 11 mg/dL (ref 6–20)
CO2: 21 mmol/L — ABNORMAL LOW (ref 22–32)
Calcium: 9.3 mg/dL (ref 8.9–10.3)
Chloride: 104 mmol/L (ref 98–111)
Creatinine, Ser: 0.66 mg/dL (ref 0.61–1.24)
GFR, Estimated: >60 mL/min (ref >60)
Glucose, Bld: 173 mg/dL — ABNORMAL HIGH (ref 70–99)
Potassium: 3.3 mmol/L — ABNORMAL LOW (ref 3.5–5.1)
Sodium: 139 mmol/L (ref 135–145)
Total Bilirubin: 1.9 mg/dL — ABNORMAL HIGH (ref 0.3–1.2)
Total Protein: 7.5 g/dL (ref 6.5–8.1)

## 2022-10-31 LAB — CBG MONITORING, ED: Glucose-Capillary: 173 mg/dL — ABNORMAL HIGH (ref 70–99)

## 2022-10-31 LAB — LIPASE, BLOOD: Lipase: 23 U/L (ref 11–51)

## 2022-10-31 MED ORDER — DIAZEPAM 5 MG/ML IJ SOLN
5.0000 mg | Freq: Once | INTRAMUSCULAR | Status: AC
  Administered 2022-10-31: 5 mg via INTRAVENOUS
  Filled 2022-10-31: qty 2

## 2022-10-31 MED ORDER — DROPERIDOL 2.5 MG/ML IJ SOLN
2.5000 mg | Freq: Once | INTRAMUSCULAR | Status: AC
  Administered 2022-10-31: 2.5 mg via INTRAVENOUS
  Filled 2022-10-31: qty 2

## 2022-10-31 MED ORDER — MORPHINE SULFATE (PF) 2 MG/ML IV SOLN
2.0000 mg | Freq: Once | INTRAVENOUS | Status: AC
  Administered 2022-10-31: 2 mg via INTRAVENOUS
  Filled 2022-10-31: qty 1

## 2022-10-31 MED ORDER — DIPHENHYDRAMINE HCL 50 MG/ML IJ SOLN
12.5000 mg | Freq: Once | INTRAMUSCULAR | Status: AC
  Administered 2022-10-31: 12.5 mg via INTRAVENOUS
  Filled 2022-10-31: qty 1

## 2022-10-31 MED ORDER — SODIUM CHLORIDE 0.9 % IV BOLUS
1000.0000 mL | Freq: Once | INTRAVENOUS | Status: AC
  Administered 2022-10-31: 1000 mL via INTRAVENOUS

## 2022-10-31 MED ORDER — METOCLOPRAMIDE HCL 5 MG/ML IJ SOLN
10.0000 mg | Freq: Once | INTRAMUSCULAR | Status: AC
  Administered 2022-10-31: 10 mg via INTRAVENOUS
  Filled 2022-10-31: qty 2

## 2022-10-31 NOTE — ED Provider Notes (Signed)
[place] EMERGENCY DEPARTMENT AT [facility] Provider Note   CSN: [id number] Arrival date & time: 10/31/22  1251     History {Add pertinent medical, surgical, social history, OB history to HPI:1} Chief Complaint  Patient presents with   Emesis   Abdominal Pain    Shawn Maldonado is a 37 y.o. male.  Patient with history of T1DM and marijuana use returns today with complaints of nausea, vomiting, and abdominal pain. Patient just admitted for same, initially thought to be gastroparesis, however patient had normal gastric emptying study on 6/24 and therefore symptoms suspected to be due to thought to be due to cannabinoid hyperemesis. Patient discharged yesterday for same, immediately went home and smoked marijuana and began vomiting again. Patient denies any fever, chills, diarrhea. No history of abdominal surgeries. He denies any hematemesis, hematochezia.  He denies any dysuria. Endorses compliance with insulin.   The history is provided by the patient. No language interpreter was used.  Emesis Associated symptoms: abdominal pain   Abdominal Pain Associated symptoms: nausea and vomiting        Home Medications Prior to Admission medications   Medication Sig Start Date End Date Taking? Authorizing Provider  Continuous Blood Gluc Receiver (DEXCOM G7 RECEIVER) DEVI Check blood sugar three times daily. E10.69 05/31/22   Hoy Register, MD  Continuous Blood Gluc Sensor (DEXCOM G7 SENSOR) MISC Check blood sugar three times daily. Change sensors once every 10 days. E10.69 05/31/22   Hoy Register, MD  gabapentin (NEURONTIN) 300 MG capsule Take 1 capsule (300 mg total) by mouth at bedtime. 05/31/22   Hoy Register, MD  insulin aspart (NOVOLOG) 100 UNIT/ML injection Inject 0-12 Units into the skin 3 (three) times daily before meals. Per sliding scale 08/30/22   Hoy Register, MD  insulin glargine (LANTUS) 100 UNIT/ML injection ADMINISTER 25 UNITS UNDER THE SKIN  DAILY Patient taking differently: Inject 25 Units into the skin daily. 08/30/22   Hoy Register, MD  omeprazole (PRILOSEC) 40 MG capsule Take 1 capsule (40 mg total) by mouth daily. 10/30/22   Marinda Elk, MD  ondansetron (ZOFRAN) 4 MG tablet Take 1 tablet (4 mg total) by mouth every 6 (six) hours as needed for nausea. 09/30/22   Jerald Kief, MD      Allergies    Patient has no known allergies.    Review of Systems   Review of Systems  Gastrointestinal:  Positive for abdominal pain, nausea and vomiting.  All other systems reviewed and are negative.   Physical Exam Updated Vital Signs BP 130/81 (BP Location: Left Arm)   Pulse 65   Temp 98.9 F (37.2 C) (Oral)   Resp 18   Ht 5\' 10"  (1.778 m)   Wt 54 kg   SpO2 100%   BMI 17.08 kg/m  Physical Exam Vitals and nursing note reviewed.  Constitutional:      General: He is not in acute distress.    Appearance: Normal appearance. He is normal weight. He is not ill-appearing, toxic-appearing or diaphoretic.  HENT:     Head: Normocephalic and atraumatic.  Cardiovascular:     Rate and Rhythm: Normal rate.  Pulmonary:     Effort: Pulmonary effort is normal. No respiratory distress.  Abdominal:     General: Abdomen is flat.     Palpations: Abdomen is soft.     Tenderness: There is generalized abdominal tenderness. There is no guarding or rebound.  Musculoskeletal:        General: Normal  range of motion.     Cervical back: Normal range of motion.  Skin:    General: Skin is warm and dry.  Neurological:     General: No focal deficit present.     Mental Status: He is alert.  Psychiatric:        Mood and Affect: Mood normal.        Behavior: Behavior normal.     ED Results / Procedures / Treatments   Labs (all labs ordered are listed, but only abnormal results are displayed) Labs Reviewed  CBC WITH DIFFERENTIAL/PLATELET - Abnormal; Notable for the following components:      Result Value   WBC 11.7 (*)    HCT 38.2  (*)    Neutro Abs 9.6 (*)    All other components within normal limits  CBG MONITORING, ED - Abnormal; Notable for the following components:   Glucose-Capillary 173 (*)    All other components within normal limits  COMPREHENSIVE METABOLIC PANEL  LIPASE, BLOOD  URINALYSIS, ROUTINE W REFLEX MICROSCOPIC    EKG None  Radiology No results found.  Procedures Procedures  {Document cardiac monitor, telemetry assessment procedure when appropriate:1}  Medications Ordered in ED Medications  droperidol (INAPSINE) 2.5 MG/ML injection 2.5 mg (2.5 mg Intravenous Given 10/31/22 1417)  diazepam (VALIUM) injection 5 mg (5 mg Intravenous Given 10/31/22 1417)  sodium chloride 0.9 % bolus 1,000 mL (1,000 mLs Intravenous New Bag/Given 10/31/22 1417)    ED Course/ Medical Decision Making/ A&P   {   Click here for ABCD2, HEART and other calculatorsREFRESH Note before signing :1}                          Medical Decision Making Amount and/or Complexity of Data Reviewed Labs: ordered.  Risk Prescription drug management.   This patient is a 37 y.o. male who presents to the ED for concern of abdominal pain, nausea, and vomiting, this involves an extensive number of treatment options, and is a complaint that carries with it a high risk of complications and morbidity. The emergent differential diagnosis prior to evaluation includes, but is not limited to, cannabinoid hyperemesis syndrome, AAA, gastroenteritis, appendicitis, Bowel obstruction, Bowel perforation. Gastroparesis, DKA, Hernia, Inflammatory bowel disease, mesenteric ischemia, pancreatitis, peritonitis SBP, volvulus.  This is not an exhaustive differential.   Past Medical History / Co-morbidities / Social History: history of T1DM and marijuana use  Additional history: Chart reviewed. Pertinent results include: Recent admission to the follow-up to be gastroparesis, however had a normal gastric emptying study.  Symptoms then attributed to  cannabinoid hyperemesis syndrome.  Physical Exam: Physical exam performed. The pertinent findings include: Actively vomiting, generalized abdominal tenderness without rebound or guarding.  Lab Tests: I ordered, and personally interpreted labs.  The pertinent results include:  WBC 11.7 improved from 2 days ago. CBG 173. UA, CMP, and lipase pending   Medications: I ordered medication including droperidol, valium, fluids  for nausea/vomiting. Reevaluation of the patient after these medicines showed that the patient  reassessment pending at shift change . I have reviewed the patients home medicines and have made adjustments as needed.   Disposition:  Patient presents with nausea and vomiting.  Suspect cannabinoid hyperemesis as patient diagnosed with same when he was admitted and discharged yesterday.  He smoked marijuana again after discharge which triggered his symptoms.  Given medication and fluids for same, labs and reassessment pending at shift change.  Given that he was just evaluated for  the symptoms, low suspicion that you will need repeat imaging.  Care handoff to [facility], PA-C at shift change.  Please see their note for continued evaluation and dispo.   Final Clinical Impression(s) / ED Diagnoses Final diagnoses:  None    Rx / DC Orders ED Discharge Orders     None

## 2022-10-31 NOTE — ED Notes (Signed)
Pt given saltines to ensure he is able to keep food down without vomiting.

## 2022-10-31 NOTE — Transitions of Care (Post Inpatient/ED Visit) (Signed)
   10/31/2022  Name: Shawn Maldonado MRN: [id number] DOB: April 14, 1986  Today's TOC FU Call Status: Today's TOC FU Call Status:: Unsuccessul Call (1st Attempt) Unsuccessful Call (1st Attempt) Date: 10/31/22  Attempted to reach the patient regarding the most recent Inpatient/ED visit.  Follow Up Plan: Additional outreach attempts will be made to reach the patient to complete the Transitions of Care (Post Inpatient/ED visit) call.   Signature  Robyne Peers, RN

## 2022-10-31 NOTE — ED Notes (Signed)
Walked into pt room to provide fluid challenge. Woke pt up to give him fluids. Pt provided with sprite at this time.

## 2022-10-31 NOTE — ED Triage Notes (Signed)
Pt arrives via EMS with n/v and abd pain for 4 days. Hx of hyperemesis and smoked yesterday after being discharged. Pt states pain is same as when he was just here. Unable to sit still.

## 2022-10-31 NOTE — Discharge Instructions (Addendum)
Thank you for allowing [place] to be part of your care today.  You were evaluated in the ED for nausea, vomiting, and abdominal pain.  Your overall workup is reassuring.  We suspect that your symptoms are related to your marijuana use.  Using marijuana will cause you to have severe nausea, vomiting, and abdominal pain known as cannabinoid hyperemesis syndrome.  Please avoid smoking or ingesting marijuana or other THC substances as this may trigger this syndrome and cause you to have return of the symptoms.  Continue to monitor your blood sugars at home and take all of your prescribed medications.  Return to the ED if you develop worsening of your symptoms, are unable to tolerate liquids/food, or if you have any new concerns.

## 2022-10-31 NOTE — Telephone Encounter (Signed)
Too soon med reordered 08/30/22 for 30 ml and 6 RF  Requested Prescriptions  Refused Prescriptions Disp Refills   insulin glargine (LANTUS) 100 UNIT/ML injection 30 mL 6    Sig: ADMINISTER 25 UNITS UNDER THE SKIN DAILY     Endocrinology:  Diabetes - Insulins Failed - 10/30/2022  5:55 PM      Failed - HBA1C is between 0 and 7.9 and within 180 days    HbA1c, POC (controlled diabetic range)  Date Value Ref Range Status  08/30/2022 10.5 (A) 0.0 - 7.0 % Final   Hgb A1c MFr Bld  Date Value Ref Range Status  09/30/2022 9.4 (H) 4.8 - 5.6 % Final    Comment:    (NOTE)         Prediabetes: 5.7 - 6.4         Diabetes: >6.4         Glycemic control for adults with diabetes: <7.0          Passed - Valid encounter within last 6 months    Recent Outpatient Visits           2 weeks ago Type 1 diabetes mellitus with other specified complication [facility])   [facility] [place], Odette Horns, MD   2 months ago Type 1 diabetes mellitus with other specified complication [facility])   [facility] [place], [place], MD   5 months ago Type 1 diabetes mellitus with other specified complication [facility])   [facility] Hoy Register, MD       Future Appointments             In 2 months Hoy Register, MD [facility]

## 2022-10-31 NOTE — ED Provider Notes (Signed)
Accepted handoff at shift change from Lurena Nida, PA-C. Please see prior provider note for more detail.   Briefly: Patient is 37 y.o. with past medical history of T1DM and marijuana use returns to the ED with nausea, vomiting, and abdominal pain.  Patient recently admitted for same, initially thought to be gastroparesis, however patient's workup was normal.  Suspect cannabinoid hyperemesis syndrome.  Patient discharged yesterday, immediately went home and smoked marijuana and began vomiting again.  DDX: concern for cannabinoid hyperemesis syndrome  Plan: Continue to provide care and give patient p.o. challenge, patient passes p.o. challenge he may be discharged home     Results for orders placed or performed during the hospital encounter of 10/31/22  Comprehensive metabolic panel  Result Value Ref Range   Sodium 139 135 - 145 mmol/L   Potassium 3.3 (L) 3.5 - 5.1 mmol/L   Chloride 104 98 - 111 mmol/L   CO2 21 (L) 22 - 32 mmol/L   Glucose, Bld 173 (H) 70 - 99 mg/dL   BUN 11 6 - 20 mg/dL   Creatinine, Ser 1.61 0.61 - 1.24 mg/dL   Calcium 9.3 8.9 - 09.6 mg/dL   Total Protein 7.5 6.5 - 8.1 g/dL   Albumin 4.7 3.5 - 5.0 g/dL   AST 32 15 - 41 U/L   ALT 27 0 - 44 U/L   Alkaline Phosphatase 56 38 - 126 U/L   Total Bilirubin 1.9 (H) 0.3 - 1.2 mg/dL   GFR, Estimated >04 >54 mL/min   Anion gap 14 5 - 15  Lipase, blood  Result Value Ref Range   Lipase 23 11 - 51 U/L  CBC with Diff  Result Value Ref Range   WBC 11.7 (H) 4.0 - 10.5 K/uL   RBC 4.35 4.22 - 5.81 MIL/uL   Hemoglobin 13.2 13.0 - 17.0 g/dL   HCT 09.8 (L) 11.9 - 14.7 %   MCV 87.8 80.0 - 100.0 fL   MCH 30.3 26.0 - 34.0 pg   MCHC 34.6 30.0 - 36.0 g/dL   RDW 82.9 56.2 - 13.0 %   Platelets 191 150 - 400 K/uL   nRBC 0.0 0.0 - 0.2 %   Neutrophils Relative % 82 %   Neutro Abs 9.6 (H) 1.7 - 7.7 K/uL   Lymphocytes Relative 11 %   Lymphs Abs 1.3 0.7 - 4.0 K/uL   Monocytes Relative 6 %   Monocytes Absolute 0.7 0.1 - 1.0 K/uL    Eosinophils Relative 0 %   Eosinophils Absolute 0.0 0.0 - 0.5 K/uL   Basophils Relative 1 %   Basophils Absolute 0.1 0.0 - 0.1 K/uL   Immature Granulocytes 0 %   Abs Immature Granulocytes 0.04 0.00 - 0.07 K/uL  Urinalysis, Routine w reflex microscopic -Urine, Clean Catch  Result Value Ref Range   Color, Urine YELLOW YELLOW   APPearance CLEAR CLEAR   Specific Gravity, Urine 1.017 1.005 - 1.030   pH 6.0 5.0 - 8.0   Glucose, UA >=500 (A) NEGATIVE mg/dL   Hgb urine dipstick NEGATIVE NEGATIVE   Bilirubin Urine NEGATIVE NEGATIVE   Ketones, ur 80 (A) NEGATIVE mg/dL   Protein, ur NEGATIVE NEGATIVE mg/dL   Nitrite NEGATIVE NEGATIVE   Leukocytes,Ua NEGATIVE NEGATIVE   RBC / HPF 0-5 0 - 5 RBC/hpf   WBC, UA 0-5 0 - 5 WBC/hpf   Bacteria, UA NONE SEEN NONE SEEN   Squamous Epithelial / HPF 0-5 0 - 5 /HPF   Mucus PRESENT   CBG monitoring,  ED  Result Value Ref Range   Glucose-Capillary 173 (H) 70 - 99 mg/dL   NM Gastric Emptying  Result Date: 10/23/2022 CLINICAL DATA:  Rule out gastroparesis.  Diabetic. EXAM: NUCLEAR MEDICINE GASTRIC EMPTYING SCAN TECHNIQUE: After oral ingestion of radiolabeled meal, sequential abdominal images were obtained for 4 hours. Percentage of activity emptying the stomach was calculated at 1 hour, 2 hour, 3 hour, and 4 hours. RADIOPHARMACEUTICALS:  2.1 mCi Tc-47m sulfur colloid in standardized meal COMPARISON:  None Available. FINDINGS: Expected location of the stomach in the left upper quadrant. Ingested meal empties the stomach gradually over the course of the study. 60% emptied at 1 hr ( normal >= 10%) 79% emptied at 2 hr ( normal >= 40%) 86% emptied at 3 hr ( normal >= 70%) 93% emptied at 4 hr ( normal >= 90%) IMPRESSION: Normal gastric emptying study. Electronically Signed   By: Gerome Sam III M.D.   On: 10/23/2022 14:30    Physical Exam  BP (!) 145/89   Pulse 66   Temp 98.7 F (37.1 C) (Oral)   Resp 20   Ht 5\' 10"  (1.778 m)   Wt 54 kg   SpO2 99%   BMI  17.08 kg/m   Physical Exam Vitals and nursing note reviewed.  Constitutional:      General: He is not in acute distress.    Appearance: Normal appearance. He is not ill-appearing or diaphoretic.  Pulmonary:     Effort: Pulmonary effort is normal.  Abdominal:     General: Abdomen is flat.     Palpations: Abdomen is soft.     Tenderness: There is no abdominal tenderness.  Skin:    General: Skin is warm and dry.     Capillary Refill: Capillary refill takes less than 2 seconds.  Neurological:     Mental Status: He is alert. Mental status is at baseline.  Psychiatric:        Mood and Affect: Mood normal.        Behavior: Behavior normal.     Procedures  Procedures  ED Course / MDM    Medical Decision Making Amount and/or Complexity of Data Reviewed Labs: ordered.  Risk Prescription drug management.   This patient presents to the ED with chief complaint(s) of nausea, vomiting, abdominal pain with pertinent past medical history of T1DM, marijuana use.  The complaint involves an extensive differential diagnosis and also carries with it a high risk of complications and morbidity.    The differential diagnosis includes cannibinoid hyperemesis syndrome, DKA, gastroparesis   Initial Assessment:   On exam, patient is resting comfortably in bed and does not have any signs of acute distress.  Patient is not actively vomiting.  Abdomen is soft and nontender to palpation.  Skin is warm and dry.  Treatment and Reassessment: Patient continued to complain of nausea without active vomiting and abdominal pain after he was given Valium and droperidol.  Patient given morphine, Reglan, and Benadryl with improvement in his symptoms.  Patient was able to successfully pass a p.o. challenge with Sprite and crackers.  Disposition:   Patient is tolerating p.o. and has not had further episodes of vomiting.  Will discharge home.  Advised patient to avoid smoking or ingesting marijuana or other THC  products as we suspect his symptoms are related to cannabinoid hyperemesis syndrome.  The patient has been appropriately medically screened and/or stabilized in the ED. I have low suspicion for any other emergent medical condition which would require  further screening, evaluation or treatment in the ED or require inpatient management. At time of discharge the patient is hemodynamically stable and in no acute distress. I have discussed work-up results and diagnosis with patient and answered all questions. Patient is agreeable with discharge plan. We discussed strict return precautions for returning to the emergency department and they verbalized understanding.    Social Determinants of Health:   Patient's  marijuana use   increases the complexity of managing their presentation        Barrie Lyme 10/31/22 2117    Linwood Dibbles, MD 10/31/22 2351

## 2022-11-01 ENCOUNTER — Encounter: Payer: Medicaid Other | Attending: Family Medicine | Admitting: Nutrition

## 2022-11-01 ENCOUNTER — Telehealth: Payer: Self-pay

## 2022-11-01 DIAGNOSIS — E1042 Type 1 diabetes mellitus with diabetic polyneuropathy: Secondary | ICD-10-CM | POA: Insufficient documentation

## 2022-11-01 DIAGNOSIS — E1065 Type 1 diabetes mellitus with hyperglycemia: Secondary | ICD-10-CM | POA: Insufficient documentation

## 2022-11-01 NOTE — Progress Notes (Unsigned)
HIstory:  Type 1 for more than 10 years.  He admits he has not take care of himself and is here to learn about what he should be eating.  Says he has made a major change in his diet and has been taking his insulin "as he was directed" since getting the diagnosis of cannabinoid hyperemesis 3 days ago.  He reports having stopped cannabus, X3 days now and nausea and vomiting are rare now.  Says has stopped all sweet drinks, and snack foods like chips and candy.  He is eating 3 meals and limiting his carbs to only 1-2 servings per meal.   SBGM:  dexcom G7 -going to his phone.  He did not download the clarity app, so only last 24 hours of readings are showing.  He downloaded the clarity app and was instructed on how to read the 2 week blood sugar patterns, as well as time in range (70-180) and blood sugar patterns at specific times of day over the 2 week period Medication:  Lantus:  15u am 8AM, and 4u PM.                       Novolog  variable, based on meal size.  Says has taken no Novolog because is eating very little carb.   Exercise: ANl   Diet:  Typical day: 7:30 up 8 AM:  Bfast:  1 piece of toast with butter, or 2 eggs with cheese, or potato with egg and cheese 10AM: Banana and fruit 2PM: snack or lunch:  fruit, with cheese or 1 piece of bread sandwich.  Water to drink 7PM: supper:  protein of some kind-4-8 ounces, veg.-usually non starchy, no bread or potatoes. Will occasionally have corn 9-10:  piece of fruit. Discussion: Need for balanced meals, 3 basic food groups, what foods fall into each group, and how much from each group he should be eating. Need to check blood sugar 2hr. Pc meals to determine if needing more insulin for that meal.  Goal: 2hr. Pc: less than 180, and blood sugars going back to pc readings 4 hours later.   Need to have some protein with fruit snack at 10AM to slow blood sugar rise down--hand full of nuts, one ounce of cheese, Need to have more carbs at supper meal in the  form of starchy veg., like potatoes, corn or peas, or basmati brown rice-2/3 cup.   Need for protein at HS snack.  Suggestions given for this Believe overall understanding is good, and motivation is high at current time for needed dietary changes.

## 2022-11-01 NOTE — Telephone Encounter (Signed)
From [facility] call:  He stated he is feeling better and that he was not aware that his symptoms were from smoking weed.   He has all of his medications as well as a Dexcom G7.  He is meeting with the nutritionist for the first time today and his hoping to get on track with the correct diet.  His appointment with Dr Alvis Lemmings is 01/11/2023.  He wants to keep that appointment unless Dr Alvis Lemmings wants to see him sooner.

## 2022-11-01 NOTE — Transitions of Care (Post Inpatient/ED Visit) (Signed)
11/01/2022  Name: Shawn Maldonado MRN: [id number] DOB: 04-28-1986  Today's TOC FU Call Status: Today's TOC FU Call Status:: Successful TOC FU Call Competed Unsuccessful Call (1st Attempt) Date: 10/31/22 [facility] FU Call Complete Date: 11/01/22  Transition Care Management Follow-up Telephone Call Date of Discharge: 10/30/22 Discharge Facility: [facility]) Type of Discharge: Inpatient Admission (He was also seen in ED 10/31/2022 for the same symptoms) Primary Inpatient Discharge Diagnosis:: intractable nausea/ vomiting. How have you been since you were released from the hospital?: Better Any questions or concerns?: No  Items Reviewed: Did you receive and understand the discharge instructions provided?: Yes Medications obtained,verified, and reconciled?: No Medications Not Reviewed Reasons::  (He said he has all medications and did not have any questions about the meds or need to review the med list. he also has a Dexcom G7) Any new allergies since your discharge?: No Dietary orders reviewed?: Yes Type of Diet Ordered:: Heart Healthy diabetic.  He said he is meeting with the nutritionist for the first time today and his hoping to get on track with the correct diet. Do you have support at home?: Yes People in Home: significant other Name of Support/Comfort Primary Source: girlfriend  Medications Reviewed Today: Medications Reviewed Today     Reviewed by Patience Musca, CPhT (Pharmacy Technician) on 10/28/22 at 2204  Med List Status: Complete   Medication Order Taking? Sig Documenting Provider Last Dose Status Informant  Continuous Blood Gluc Receiver [facility] G7 RECEIVER) DEVI [id number]  Check blood sugar three times daily. E10.69 Hoy Register, MD  Active Self, Pharmacy Records  Continuous Blood Gluc Sensor [facility] G7 SENSOR) MISC [id number]  Check blood sugar three times daily. Change sensors once every 10 days. E10.69 Hoy Register, MD  Active Self, Pharmacy Records   gabapentin (NEURONTIN) 300 MG capsule [id number] Yes Take 1 capsule (300 mg total) by mouth at bedtime. Hoy Register, MD Past Week Active Self, Pharmacy Records           Med Note Orson Aloe Oct 02, 2022  2:39 PM) He said he is not taking it every night  insulin aspart (NOVOLOG) 100 UNIT/ML injection [id number] Yes Inject 0-12 Units into the skin 3 (three) times daily before meals. Per sliding scale Hoy Register, MD 10/28/2022 Active Self, Pharmacy Records  insulin glargine (LANTUS) 100 UNIT/ML injection [id number] Yes ADMINISTER 25 UNITS UNDER THE SKIN DAILY  Patient taking differently: Inject 25 Units into the skin daily.   Hoy Register, MD 10/28/2022 Active Self, Pharmacy Records  omeprazole (PRILOSEC) 40 MG capsule [id number] Yes Take 1 capsule (40 mg total) by mouth daily. Hoy Register, MD 10/28/2022 Active Self, Pharmacy Records  ondansetron [facility]) 4 MG tablet [id number] Yes Take 1 tablet (4 mg total) by mouth every 6 (six) hours as needed for nausea. Jerald Kief, MD unk Active Self, Pharmacy Records            Home Care and Equipment/Supplies: Were Home Health Services Ordered?: No Any new equipment or medical supplies ordered?: No  Functional Questionnaire: Do you need assistance with bathing/showering or dressing?: No Do you need assistance with meal preparation?: No Do you need assistance with eating?: No Do you have difficulty maintaining continence: No Do you need assistance with getting out of bed/getting out of a chair/moving?: No Do you have difficulty managing or taking your medications?: No  Follow up appointments reviewed: PCP Follow-up appointment confirmed?: Yes Date of PCP follow-up appointment?: 01/11/23 Follow-up Provider: Dr Alvis Lemmings.  he wanted to keep the appointment that he has unless Dr Alvis Lemmings wants to see him sooner. Specialist Hospital Follow-up appointment confirmed?: NA Do you need transportation to your follow-up appointment?:  No Do you understand care options if your condition(s) worsen?: Yes-patient verbalized understanding    SIGNATURE Robyne Peers, RN

## 2022-11-15 ENCOUNTER — Ambulatory Visit: Payer: Medicaid Other | Admitting: Family Medicine

## 2022-11-20 ENCOUNTER — Ambulatory Visit: Payer: Medicaid Other | Admitting: Family Medicine

## 2022-11-22 NOTE — Patient Instructions (Addendum)
Make sure all meals have some protein, and small amount of fat and 2-3 servings of carbohydrate (fruit, milk, starch ) Need to check blood sugar 2hr. After a meals to determine if needing more insulin for that meal.  Goal: 2hr. Pc: less than 180, and blood sugars going back to pc readings 4 hours later.   Need to have some protein with fruit snack at 10AM to slow blood sugar rise down--hand full of nuts, one ounce of cheese, Need to have more carbs at supper meal in the form of starchy veg., like potatoes, corn or peas, or basmati brown rice-2/3 cup.   Need for protein at HS snack-nuts, peanut butter, cheese, meat

## 2022-12-25 ENCOUNTER — Ambulatory Visit: Payer: Medicaid Other | Admitting: Family Medicine

## 2022-12-29 ENCOUNTER — Other Ambulatory Visit: Payer: Self-pay | Admitting: Family Medicine

## 2022-12-29 DIAGNOSIS — E1069 Type 1 diabetes mellitus with other specified complication: Secondary | ICD-10-CM

## 2023-01-11 ENCOUNTER — Ambulatory Visit: Payer: Medicaid Other | Attending: Family Medicine | Admitting: Family Medicine

## 2023-01-11 ENCOUNTER — Encounter: Payer: Self-pay | Admitting: Family Medicine

## 2023-01-11 VITALS — BP 128/80 | HR 79 | Ht 70.0 in | Wt 139.6 lb

## 2023-01-11 DIAGNOSIS — R636 Underweight: Secondary | ICD-10-CM

## 2023-01-11 DIAGNOSIS — E1069 Type 1 diabetes mellitus with other specified complication: Secondary | ICD-10-CM

## 2023-01-11 DIAGNOSIS — N529 Male erectile dysfunction, unspecified: Secondary | ICD-10-CM | POA: Diagnosis not present

## 2023-01-11 DIAGNOSIS — E876 Hypokalemia: Secondary | ICD-10-CM | POA: Diagnosis not present

## 2023-01-11 LAB — POCT GLYCOSYLATED HEMOGLOBIN (HGB A1C): HbA1c, POC (controlled diabetic range): 6.7 % (ref 0.0–7.0)

## 2023-01-11 LAB — GLUCOSE, POCT (MANUAL RESULT ENTRY): POC Glucose: 123 mg/dL — AB (ref 70–99)

## 2023-01-11 MED ORDER — SILDENAFIL CITRATE 100 MG PO TABS: 100.0000 mg | ORAL_TABLET | Freq: Every day | ORAL | 2 refills | Status: DC | PRN

## 2023-01-11 MED ORDER — MIRTAZAPINE 15 MG PO TBDP
15.0000 mg | ORAL_TABLET | Freq: Every day | ORAL | 1 refills | Status: DC
Start: 2023-01-11 — End: 2024-01-01

## 2023-01-11 NOTE — Progress Notes (Signed)
Subjective:  Patient ID: Shawn Maldonado, male    DOB: 02/12/1986  Age: 37 y.o. MRN: [id number]  CC: Medical Management of Chronic Issues (Pt interested in taking vitamins, want to know what you recommend. )   HPI Shawn Maldonado is a 37 y.o. year old male with a history of type 1 diabetes mellitus (A1c 6.7).   Interval History: Discussed the use of AI scribe software for clinical note transcription with the patient, who gave verbal consent to proceed.  He presents for a routine follow-up. He has had significant improvement in his A1c levels, which have decreased from 9.4 to 6.7. The patient attributes this improvement to his diligent use of a CGM and adherence to his insulin regimen, which includes Lantus and NovoLog.  However, the patient has recently experienced fluctuations in his blood sugar levels due to a change in his work schedule. He reports episodes of hypoglycemia, with blood sugar levels dropping to 40, particularly during his work hours from 6 PM to 10 PM. The patient attributes these episodes to increased physical activity at work and possibly not eating enough. He also reports episodes of hyperglycemia, with blood sugar levels reaching close to 400, which he attributes to his diet.  CGM data:  7 day average: 171 14 day average: 173 Time in range: 53% High: 28% Very high: 15% Low: 3% Very low: 1%   The patient also expresses a desire to gain weight and has been trying to increase his food intake. He reports eating a lot of peanuts and trying to balance his blood sugar levels with coffee. The patient also requests a prescription for Viagra, for experiencing erectile dysfunction.        Past Medical History:  Diagnosis Date   Diabetes mellitus without complication (HCC)     Past Surgical History:  Procedure Laterality Date   MOUTH SURGERY      Family History  Problem Relation Age of Onset   Stroke Father    CAD Other     Social History    Socioeconomic History   Marital status: Single    Spouse name: Not on file   Number of children: 2   Years of education: Not on file   Highest education level: 11th grade  Occupational History   Occupation: Works in a Science writer.  Tobacco Use   Smoking status: Former    Types: Cigarettes   Smokeless tobacco: Never  Vaping Use   Vaping status: Never Used  Substance and Sexual Activity   Alcohol use: Yes    Comment: social   Drug use: Yes    Types: Marijuana   Sexual activity: Never  Other Topics Concern   Not on file  Social History Narrative   Lives with girlfriend.     Social Determinants of Health   Financial Resource Strain: Low Risk  (01/09/2023)   Overall Financial Resource Strain (CARDIA)    Difficulty of Paying Living Expenses: Not hard at all  Food Insecurity: No Food Insecurity (01/09/2023)   Hunger Vital Sign    Worried About Running Out of Food in the Last Year: Never true    Ran Out of Food in the Last Year: Never true  Transportation Needs: No Transportation Needs (01/09/2023)   PRAPARE - Administrator, Civil Service (Medical): No    Lack of Transportation (Non-Medical): No  Physical Activity: Unknown (01/09/2023)   Exercise Vital Sign    Days of Exercise per Week: Patient declined  Minutes of Exercise per Session: Not on file  Stress: No Stress Concern Present (01/09/2023)   [employer] of Occupational Health - Occupational Stress Questionnaire    Feeling of Stress : Not at all  Social Connections: Moderately Isolated (01/09/2023)   Social Connection and Isolation Panel [NHANES]    Frequency of Communication with Friends and Family: More than three times a week    Frequency of Social Gatherings with Friends and Family: More than three times a week    Attends Religious Services: Never    Database administrator or Organizations: No    Attends Engineer, structural: Not on file    Marital Status: Living with partner     No Known Allergies  Outpatient Medications Prior to Visit  Medication Sig Dispense Refill   Continuous Blood Gluc Receiver (DEXCOM G7 RECEIVER) DEVI Check blood sugar three times daily. E10.69 1 each 0   Continuous Glucose Sensor (DEXCOM G7 SENSOR) MISC USE TO CHECK BLOOD SUGAR THREE TIMES DAILY AND CHANGE SENSORS ONCE EVERY 10 DAYS 3 each 3   gabapentin (NEURONTIN) 300 MG capsule Take 1 capsule (300 mg total) by mouth at bedtime. 90 capsule 1   insulin aspart (NOVOLOG) 100 UNIT/ML injection Inject 0-12 Units into the skin 3 (three) times daily before meals. Per sliding scale 30 mL 6   insulin glargine (LANTUS) 100 UNIT/ML injection ADMINISTER 25 UNITS UNDER THE SKIN DAILY (Patient taking differently: Inject 25 Units into the skin daily.) 30 mL 6   omeprazole (PRILOSEC) 40 MG capsule Take 1 capsule (40 mg total) by mouth daily. (Patient not taking: Reported on 01/11/2023) 90 capsule 1   ondansetron (ZOFRAN) 4 MG tablet Take 1 tablet (4 mg total) by mouth every 6 (six) hours as needed for nausea. (Patient not taking: Reported on 01/11/2023) 20 tablet 0   No facility-administered medications prior to visit.     ROS Review of Systems  Constitutional:  Negative for activity change and appetite change.  HENT:  Negative for sinus pressure and sore throat.   Respiratory:  Negative for chest tightness, shortness of breath and wheezing.   Cardiovascular:  Negative for chest pain and palpitations.  Gastrointestinal:  Negative for abdominal distention, abdominal pain and constipation.  Genitourinary: Negative.   Musculoskeletal: Negative.   Psychiatric/Behavioral:  Negative for behavioral problems and dysphoric mood.     Objective:  BP 128/80 (BP Location: Left Arm, Patient Position: Sitting, Cuff Size: Normal)   Pulse 79   Ht 5\' 10"  (1.778 m)   Wt 139 lb 9.6 oz (63.3 kg)   SpO2 100%   BMI 20.03 kg/m      01/11/2023    9:16 AM 10/31/2022    7:17 PM 10/31/2022    4:15 PM  BP/Weight   Systolic BP 128 145 163  Diastolic BP 80 89 85  Wt. (Lbs) 139.6    BMI 20.03 kg/m2        Physical Exam Constitutional:      Appearance: He is well-developed.  Cardiovascular:     Rate and Rhythm: Normal rate.     Heart sounds: Normal heart sounds. No murmur heard. Pulmonary:     Effort: Pulmonary effort is normal.     Breath sounds: Normal breath sounds. No wheezing or rales.  Chest:     Chest wall: No tenderness.  Abdominal:     General: Bowel sounds are normal. There is no distension.     Palpations: Abdomen is soft. There is no mass.  Tenderness: There is no abdominal tenderness.  Musculoskeletal:        General: Normal range of motion.     Right lower leg: No edema.     Left lower leg: No edema.  Neurological:     Mental Status: He is alert and oriented to person, place, and time.  Psychiatric:        Mood and Affect: Mood normal.        Latest Ref Rng & Units 10/31/2022    2:00 PM 10/29/2022    6:43 AM 10/28/2022    1:48 PM  CMP  Glucose 70 - 99 mg/dL 295  621  308   BUN 6 - 20 mg/dL 11  16  19    Creatinine 0.61 - 1.24 mg/dL 6.57  8.46  9.62   Sodium 135 - 145 mmol/L 139  138  137   Potassium 3.5 - 5.1 mmol/L 3.3  3.9  3.8   Chloride 98 - 111 mmol/L 104  104  104   CO2 22 - 32 mmol/L 21  16  20    Calcium 8.9 - 10.3 mg/dL 9.3  9.1  9.3   Total Protein 6.5 - 8.1 g/dL 7.5  7.1  7.9   Total Bilirubin 0.3 - 1.2 mg/dL 1.9  1.7  1.1   Alkaline Phos 38 - 126 U/L 56  62  57   AST 15 - 41 U/L 32  19  26   ALT 0 - 44 U/L 27  19  19      Lipid Panel     Component Value Date/Time   CHOL 164 07/28/2018 1042   TRIG 43 07/28/2018 1042   HDL 58 07/28/2018 1042   CHOLHDL 2.8 07/28/2018 1042   VLDL 9 07/28/2018 1042   LDLCALC 97 07/28/2018 1042    CBC    Component Value Date/Time   WBC 11.7 (H) 10/31/2022 1400   RBC 4.35 10/31/2022 1400   HGB 13.2 10/31/2022 1400   HCT 38.2 (L) 10/31/2022 1400   PLT 191 10/31/2022 1400   MCV 87.8 10/31/2022 1400   MCH  30.3 10/31/2022 1400   MCHC 34.6 10/31/2022 1400   RDW 11.8 10/31/2022 1400   LYMPHSABS 1.3 10/31/2022 1400   MONOABS 0.7 10/31/2022 1400   EOSABS 0.0 10/31/2022 1400   BASOSABS 0.1 10/31/2022 1400    Lab Results  Component Value Date   HGBA1C 6.7 01/11/2023    Assessment & Plan:      Type 2 Diabetes Mellitus Improved glycemic control with A1c of 6.7, down from 9.4. Patient reports fluctuating blood glucose levels due to changes in work schedule and physical activity. Currently on Lantus 20 units at night and NovoLog sliding scale. -Continue current insulin regimen. -Encourage consistent meal times and snacks, especially during periods of increased physical activity. -Check cholesterol today.  Hypoglycemia Reports episodes of blood glucose dropping to 40, particularly during periods of increased physical activity at work. -Advise patient to have snacks during periods of increased physical activity. -Continue to monitor blood glucose levels closely with CGM.  Erectile Dysfunction Patient reports desire for treatment. -Prescribe Viagra, to be taken 30 minutes to 1 hour before sexual activity.  Desire for Weight Gain Patient expresses desire to gain weight. -Prescribe Remeron 15mg  at night to increase appetite and promote weight gain. -Monitor blood glucose levels closely as weight increases, may need to adjust insulin doses.  Follow-up in 3 months.          Meds ordered this encounter  Medications   sildenafil (VIAGRA) 100 MG tablet    Sig: Take 1 tablet (100 mg total) by mouth daily as needed for erectile dysfunction. At least 24 hours between doses    Dispense:  10 tablet    Refill:  2   mirtazapine (REMERON SOL-TAB) 15 MG disintegrating tablet    Sig: Take 1 tablet (15 mg total) by mouth at bedtime.    Dispense:  90 tablet    Refill:  1    Follow-up: Return in about 3 months (around 04/12/2023).       Hoy Register, MD, FAAFP. [facility] [place], [place] [phone]   01/11/2023, 10:20 AM

## 2023-01-11 NOTE — Patient Instructions (Signed)

## 2023-01-12 LAB — LP+NON-HDL CHOLESTEROL
Cholesterol, Total: 168 mg/dL (ref 100–199)
HDL: 81 mg/dL (ref >39)
LDL Chol Calc (NIH): 75 mg/dL (ref 0–99)
Total Non-HDL-Chol (LDL+VLDL): 87 mg/dL (ref 0–129)
Triglycerides: 62 mg/dL (ref 0–149)
VLDL Cholesterol Cal: 12 mg/dL (ref 5–40)

## 2023-01-12 LAB — POTASSIUM: Potassium: 4.3 mmol/L (ref 3.5–5.2)

## 2023-01-25 ENCOUNTER — Telehealth: Payer: Self-pay

## 2023-01-25 ENCOUNTER — Other Ambulatory Visit: Payer: Self-pay

## 2023-01-25 NOTE — Telephone Encounter (Signed)
Pharmacy Patient Advocate Encounter   Received notification from CoverMyMeds that prior authorization for [facility] G7 SENSOR is required/requested.   Insurance verification completed.   The patient is insured through [facility] .   Per test claim: PA required; PA submitted to [facility] via CoverMyMeds Key/confirmation #/[id number] Status is pending

## 2023-01-26 ENCOUNTER — Telehealth: Payer: Self-pay

## 2023-01-26 ENCOUNTER — Other Ambulatory Visit: Payer: Self-pay

## 2023-01-26 NOTE — Telephone Encounter (Signed)
Pharmacy Patient Advocate Encounter  Received notification from [facility] that Prior Authorization for [facility] G7 has been APPROVED from 01/25/2023 to 01/25/2024   PA #/Case ID/Reference #: [id number]

## 2023-03-05 ENCOUNTER — Other Ambulatory Visit: Payer: Self-pay | Admitting: Family Medicine

## 2023-03-05 DIAGNOSIS — E1069 Type 1 diabetes mellitus with other specified complication: Secondary | ICD-10-CM

## 2023-03-05 NOTE — Telephone Encounter (Signed)
Medication Refill - Medication: Continuous Glucose Sensor (DEXCOM G7 SENSOR) MISC   Has the patient contacted their pharmacy? Yes.   (Agent: If no, request that the patient contact the pharmacy for the refill. If patient does not wish to contact the pharmacy document the reason why and proceed with request.) (Agent: If yes, when and what did the pharmacy advise?)  Preferred Pharmacy (with phone number or street name):  [id number] - [place], [place] - [address] AT [facility] OF [address]  [address] [place] [address]  Phone: [phone] Fax: [phone]   Has the patient been seen for an appointment in the last year OR does the patient have an upcoming appointment? Yes.    Agent: Please be advised that RX refills may take up to 3 business days. We ask that you follow-up with your pharmacy.

## 2023-03-06 MED ORDER — DEXCOM G7 SENSOR MISC
3 refills | Status: DC
Start: 2023-03-06 — End: 2023-04-12

## 2023-03-06 NOTE — Telephone Encounter (Signed)
Requested Prescriptions  Pending Prescriptions Disp Refills   Continuous Glucose Sensor (DEXCOM G7 SENSOR) MISC 3 each 3    Sig: USE TO CHECK BLOOD SUGAR THREE TIMES DAILY AND CHANGE SENSORS ONCE EVERY 10 DAYS     Endocrinology: Diabetes - Testing Supplies Passed - 03/05/2023  8:27 AM      Passed - Valid encounter within last 12 months    Recent Outpatient Visits           1 month ago Type 1 diabetes mellitus with other specified complication (HCC)   [place] [facility] Dept Of [place]. [facility] Hoy Register, MD   4 months ago Type 1 diabetes mellitus with other specified complication [facility])   [place] [facility] - A Dept Of [place]. [facility] Hoy Register, MD   6 months ago Type 1 diabetes mellitus with other specified complication [facility])   [place] [facility] - A Dept Of [place]. [facility] Hoy Register, MD   9 months ago Type 1 diabetes mellitus with other specified complication [facility])   [place] [facility] - A Dept Of [place]. [facility] Hoy Register, MD       Future Appointments             In 1 month Hoy Register, MD [facility] [place] - A Dept Of [place]. [facility]

## 2023-04-05 ENCOUNTER — Other Ambulatory Visit: Payer: Self-pay | Admitting: Family Medicine

## 2023-04-05 DIAGNOSIS — E1069 Type 1 diabetes mellitus with other specified complication: Secondary | ICD-10-CM

## 2023-04-05 NOTE — Telephone Encounter (Signed)
Medication Refill -  Most Recent Primary Care Visit:  Provider: Hoy Register  Department: [facility]  Visit Type: OFFICE VISIT  Date: 01/11/2023  Medication: G7 Dexcom  Continuous Blood Gluc Receiver (DEXCOM G7 RECEIVER) DEVI    Has the patient contacted their pharmacy? Yes  Is this the correct pharmacy for this prescription? Yes This is the patient's preferred pharmacy:   [id number] - [place], [place] - [address] AT [facility] OF [address] [place]  [address] Phone: [phone] Fax: [phone]  Has the prescription been filled recently? Yes  Is the patient out of the medication? Yes  Has the patient been seen for an appointment in the last year OR does the patient have an upcoming appointment? Yes  Can we respond through MyChart? Yes  Agent: Please be advised that Rx refills may take up to 3 business days. We ask that you follow-up with your pharmacy.

## 2023-04-06 MED ORDER — DEXCOM G7 RECEIVER DEVI
0 refills | Status: DC
Start: 2023-04-06 — End: 2024-02-27

## 2023-04-06 NOTE — Telephone Encounter (Signed)
Requested Prescriptions  Pending Prescriptions Disp Refills   Continuous Glucose Receiver (DEXCOM G7 RECEIVER) DEVI 1 each 0    Sig: Check blood sugar three times daily. E10.69     Endocrinology: Diabetes - Testing Supplies Passed - 04/05/2023 11:29 AM      Passed - Valid encounter within last 12 months    Recent Outpatient Visits           2 months ago Type 1 diabetes mellitus with other specified complication (HCC)   [place] [facility] A Dept Of [place]. [facility] Hoy Register, MD   5 months ago Type 1 diabetes mellitus with other specified complication [facility])   [place] [facility] - A Dept Of [place]. [facility] Hoy Register, MD   7 months ago Type 1 diabetes mellitus with other specified complication [facility])   [place] [facility] - A Dept Of [place]. [facility] Hoy Register, MD   10 months ago Type 1 diabetes mellitus with other specified complication [facility])   [place] [facility] - A Dept Of . [facility] Hoy Register, MD       Future Appointments             In 6 days Hoy Register, MD [facility] [place] - A Dept Of [place]. [facility]

## 2023-04-12 ENCOUNTER — Ambulatory Visit: Payer: Medicaid Other | Attending: Family Medicine | Admitting: Family Medicine

## 2023-04-12 ENCOUNTER — Other Ambulatory Visit: Payer: Self-pay | Admitting: Family Medicine

## 2023-04-12 VITALS — BP 107/69 | HR 85 | Temp 98.1°F | Ht 70.0 in | Wt 138.0 lb

## 2023-04-12 DIAGNOSIS — E1069 Type 1 diabetes mellitus with other specified complication: Secondary | ICD-10-CM | POA: Diagnosis not present

## 2023-04-12 DIAGNOSIS — J069 Acute upper respiratory infection, unspecified: Secondary | ICD-10-CM

## 2023-04-12 DIAGNOSIS — K0889 Other specified disorders of teeth and supporting structures: Secondary | ICD-10-CM

## 2023-04-12 DIAGNOSIS — N529 Male erectile dysfunction, unspecified: Secondary | ICD-10-CM

## 2023-04-12 LAB — POCT GLYCOSYLATED HEMOGLOBIN (HGB A1C): HbA1c, POC (controlled diabetic range): 6.8 % (ref 0.0–7.0)

## 2023-04-12 MED ORDER — SILDENAFIL CITRATE 100 MG PO TABS: 100.0000 mg | ORAL_TABLET | Freq: Every day | ORAL | 2 refills | Status: DC | PRN

## 2023-04-12 MED ORDER — DEXCOM G7 SENSOR MISC: 12 refills | Status: DC

## 2023-04-12 MED ORDER — INSULIN GLARGINE 100 UNIT/ML ~~LOC~~ SOLN: 15.0000 [IU] | Freq: Every day | SUBCUTANEOUS | 6 refills | Status: DC

## 2023-04-12 MED ORDER — AMOXICILLIN 500 MG PO CAPS
500.0000 mg | ORAL_CAPSULE | Freq: Three times a day (TID) | ORAL | 0 refills | Status: AC
Start: 2023-04-12 — End: 2023-04-22

## 2023-04-12 MED ORDER — INSULIN ASPART 100 UNIT/ML IJ SOLN
0.0000 [IU] | Freq: Three times a day (TID) | INTRAMUSCULAR | 6 refills | Status: DC
Start: 2023-04-12 — End: 2024-01-01

## 2023-04-12 NOTE — Progress Notes (Signed)
Subjective:  Patient ID: Shawn Maldonado, male    DOB: 06/10/1985  Age: 37 y.o. MRN: [id number]  CC: Medical Management of Chronic Issues (Discuss refills on dexcom sensors/Dental referral)   HPI Shawn Maldonado is a 37 y.o. year old male with a history of  type 1 diabetes mellitus (A1c 6.8).   Interval History: Discussed the use of AI scribe software for clinical note transcription with the patient, who gave verbal consent to proceed.  He presents with a two-day history of upper respiratory symptoms, including a sore throat and body aches. He reports that he was the last in his household to fall ill, with everyone else having been sick during the past week. He denies any nasal congestion or facial pain. He has been managing his symptoms with Dayquil and Nyquil. He also reports a recent smoking cessation, which has led to dental issues, including tooth sensitivity and suspected gum infection.  He has a couple of broken teeth and cavities and would like to be referred to a dentist.  In terms of his diabetes, he reports good control with a recent A1c of 6.8. He has been taking Lantus insulin, recently reducing his dose from 25 units to 15 units daily due to dietary changes. He also uses Novolog insulin and a continuous glucose monitor. He reports occasional spikes in his blood glucose levels, which he attributes to changes in his injection site. He also reports issues with obtaining sufficient glucose monitor sensors. He also never received Viagra which was prescribed 3 months ago at his last visit from his pharmacy and is unsure why.  He has been taking Remeron to increase his appetite and aid in weight gain, which he reports has been effective. However, he has been alternating days of use due to experiencing a 'dragging' feeling in the mornings after use. He denies needing a refill at this time.        Past Medical History:  Diagnosis Date   Diabetes mellitus without complication  (HCC)     Past Surgical History:  Procedure Laterality Date   MOUTH SURGERY      Family History  Problem Relation Age of Onset   Stroke Father    CAD Other     Social History   Socioeconomic History   Marital status: Single    Spouse name: Not on file   Number of children: 2   Years of education: Not on file   Highest education level: GED or equivalent  Occupational History   Occupation: Works in a Science writer.  Tobacco Use   Smoking status: Former    Types: Cigarettes   Smokeless tobacco: Never  Vaping Use   Vaping status: Never Used  Substance and Sexual Activity   Alcohol use: Yes    Comment: social   Drug use: Yes    Types: Marijuana   Sexual activity: Never  Other Topics Concern   Not on file  Social History Narrative   Lives with girlfriend.     Social Drivers of Corporate investment banker Strain: Low Risk  (04/12/2023)   Overall Financial Resource Strain (CARDIA)    Difficulty of Paying Living Expenses: Not very hard  Food Insecurity: No Food Insecurity (04/12/2023)   Hunger Vital Sign    Worried About Running Out of Food in the Last Year: Never true    Ran Out of Food in the Last Year: Never true  Transportation Needs: Unmet Transportation Needs (04/12/2023)   PRAPARE - Transportation  Lack of Transportation (Medical): No    Lack of Transportation (Non-Medical): Yes  Physical Activity: Insufficiently Active (04/12/2023)   Exercise Vital Sign    Days of Exercise per Week: 5 days    Minutes of Exercise per Session: 20 min  Stress: No Stress Concern Present (04/12/2023)   [employer] of Occupational Health - Occupational Stress Questionnaire    Feeling of Stress : Only a little  Social Connections: Moderately Isolated (04/12/2023)   Social Connection and Isolation Panel [NHANES]    Frequency of Communication with Friends and Family: More than three times a week    Frequency of Social Gatherings with Friends and Family: More than  three times a week    Attends Religious Services: Never    Database administrator or Organizations: No    Attends Engineer, structural: Not on file    Marital Status: Living with partner    No Known Allergies  Outpatient Medications Prior to Visit  Medication Sig Dispense Refill   Continuous Glucose Receiver (DEXCOM G7 RECEIVER) DEVI Check blood sugar three times daily. E10.69 1 each 0   gabapentin (NEURONTIN) 300 MG capsule Take 1 capsule (300 mg total) by mouth at bedtime. 90 capsule 1   mirtazapine (REMERON SOL-TAB) 15 MG disintegrating tablet Take 1 tablet (15 mg total) by mouth at bedtime. 90 tablet 1   Continuous Glucose Sensor (DEXCOM G7 SENSOR) MISC USE TO CHECK BLOOD SUGAR THREE TIMES DAILY AND CHANGE SENSORS ONCE EVERY 10 DAYS 3 each 3   insulin aspart (NOVOLOG) 100 UNIT/ML injection Inject 0-12 Units into the skin 3 (three) times daily before meals. Per sliding scale 30 mL 6   insulin glargine (LANTUS) 100 UNIT/ML injection ADMINISTER 25 UNITS UNDER THE SKIN DAILY (Patient taking differently: Inject 25 Units into the skin daily.) 30 mL 6   omeprazole (PRILOSEC) 40 MG capsule Take 1 capsule (40 mg total) by mouth daily. (Patient not taking: Reported on 04/12/2023) 90 capsule 1   ondansetron (ZOFRAN) 4 MG tablet Take 1 tablet (4 mg total) by mouth every 6 (six) hours as needed for nausea. (Patient not taking: Reported on 01/11/2023) 20 tablet 0   sildenafil (VIAGRA) 100 MG tablet Take 1 tablet (100 mg total) by mouth daily as needed for erectile dysfunction. At least 24 hours between doses (Patient not taking: Reported on 04/12/2023) 10 tablet 2   No facility-administered medications prior to visit.     ROS Review of Systems  Constitutional:  Negative for activity change and appetite change.  HENT:  Positive for sore throat. Negative for sinus pressure.   Respiratory:  Negative for chest tightness, shortness of breath and wheezing.   Cardiovascular:  Negative for chest  pain and palpitations.  Gastrointestinal:  Negative for abdominal distention, abdominal pain and constipation.  Genitourinary: Negative.   Musculoskeletal: Negative.   Psychiatric/Behavioral:  Negative for behavioral problems and dysphoric mood.     Objective:  BP 107/69   Pulse 85   Temp 98.1 F (36.7 C) (Oral)   Ht 5\' 10"  (1.778 m)   Wt 138 lb (62.6 kg)   SpO2 99%   BMI 19.80 kg/m      04/12/2023   10:06 AM 01/11/2023    9:16 AM 10/31/2022    7:17 PM  BP/Weight  Systolic BP 107 128 145  Diastolic BP 69 80 89  Wt. (Lbs) 138 139.6   BMI 19.8 kg/m2 20.03 kg/m2     Wt Readings from Last 3 Encounters:  04/12/23 138 lb (62.6 kg)  01/11/23 139 lb 9.6 oz (63.3 kg)  10/31/22 119 lb 0.8 oz (54 kg)      Physical Exam Constitutional:      Appearance: He is well-developed.  HENT:     Mouth/Throat:     Comments: A couple of broken teeth and missing teeth. Cardiovascular:     Rate and Rhythm: Normal rate.     Heart sounds: Normal heart sounds. No murmur heard. Pulmonary:     Effort: Pulmonary effort is normal.     Breath sounds: Normal breath sounds. No wheezing or rales.  Chest:     Chest wall: No tenderness.  Abdominal:     General: Bowel sounds are normal. There is no distension.     Palpations: Abdomen is soft. There is no mass.     Tenderness: There is no abdominal tenderness.  Musculoskeletal:        General: Normal range of motion.     Right lower leg: No edema.     Left lower leg: No edema.  Neurological:     Mental Status: He is alert and oriented to person, place, and time.  Psychiatric:        Mood and Affect: Mood normal.        Latest Ref Rng & Units 01/11/2023   10:07 AM 10/31/2022    2:00 PM 10/29/2022    6:43 AM  CMP  Glucose 70 - 99 mg/dL  469  629   BUN 6 - 20 mg/dL  11  16   Creatinine 5.28 - 1.24 mg/dL  4.13  2.44   Sodium 010 - 145 mmol/L  139  138   Potassium 3.5 - 5.2 mmol/L 4.3  3.3  3.9   Chloride 98 - 111 mmol/L  104  104   CO2 22 -  32 mmol/L  21  16   Calcium 8.9 - 10.3 mg/dL  9.3  9.1   Total Protein 6.5 - 8.1 g/dL  7.5  7.1   Total Bilirubin 0.3 - 1.2 mg/dL  1.9  1.7   Alkaline Phos 38 - 126 U/L  56  62   AST 15 - 41 U/L  32  19   ALT 0 - 44 U/L  27  19     Lipid Panel     Component Value Date/Time   CHOL 168 01/11/2023 1007   TRIG 62 01/11/2023 1007   HDL 81 01/11/2023 1007   CHOLHDL 2.8 07/28/2018 1042   VLDL 9 07/28/2018 1042   LDLCALC 75 01/11/2023 1007    CBC    Component Value Date/Time   WBC 11.7 (H) 10/31/2022 1400   RBC 4.35 10/31/2022 1400   HGB 13.2 10/31/2022 1400   HCT 38.2 (L) 10/31/2022 1400   PLT 191 10/31/2022 1400   MCV 87.8 10/31/2022 1400   MCH 30.3 10/31/2022 1400   MCHC 34.6 10/31/2022 1400   RDW 11.8 10/31/2022 1400   LYMPHSABS 1.3 10/31/2022 1400   MONOABS 0.7 10/31/2022 1400   EOSABS 0.0 10/31/2022 1400   BASOSABS 0.1 10/31/2022 1400    Lab Results  Component Value Date   HGBA1C 6.8 04/12/2023    Assessment & Plan:      Upper Respiratory Infection Recent onset of sore throat and body aches, likely secondary to exposure to sick family members. No sinus pain or congestion. Negative COVID test. -Continue symptomatic treatment with Dayquil and Nyquil as needed.  Dental Infection Reports tooth pain and gum  infection. History of multiple dental issues including cavities and tooth extractions. -Prescribe Amoxicillin for suspected dental infection. -Refer to Dentist for further evaluation and treatment.  Type 1 Diabetes Mellitus Well controlled with A1c of 6.8. Currently on Lantus 15 units daily and Novolog as needed. Reports good dietary control and monitoring of blood glucose levels. -Continue current insulin regimen. -Refill CGM sensors for 12 months.  Erectile Dysfunction Difficulty obtaining prescribed Viagra from pharmacy. Unclear reason for issue. -Resend Viagra prescription to pharmacy. -Instruct patient to message provider if further issues  arise.  General Health Maintenance -Refer for annual eye exam due to history of diabetes. -Order routine blood work. -Schedule follow-up visit in 6 months.          Meds ordered this encounter  Medications   Continuous Glucose Sensor (DEXCOM G7 SENSOR) MISC    Sig: USE TO CHECK BLOOD SUGAR THREE TIMES DAILY AND CHANGE SENSORS ONCE EVERY 10 DAYS    Dispense:  3 each    Refill:  12   insulin glargine (LANTUS) 100 UNIT/ML injection    Sig: Inject 0.15 mLs (15 Units total) into the skin at bedtime.    Dispense:  30 mL    Refill:  6   insulin aspart (NOVOLOG) 100 UNIT/ML injection    Sig: Inject 0-12 Units into the skin 3 (three) times daily before meals. Per sliding scale    Dispense:  30 mL    Refill:  6    For blood sugars 0-150 give 0 units of insulin, 151-200 give 2 units of insulin, 201-250 give 4 units, 251-300 give 6 units, 301-350 give 8 units, 351-400 give 10 units,> 400 give 12 units and call M.D.   sildenafil (VIAGRA) 100 MG tablet    Sig: Take 1 tablet (100 mg total) by mouth daily as needed for erectile dysfunction. At least 24 hours between doses    Dispense:  10 tablet    Refill:  2   amoxicillin (AMOXIL) 500 MG capsule    Sig: Take 1 capsule (500 mg total) by mouth 3 (three) times daily for 10 days.    Dispense:  30 capsule    Refill:  0    Follow-up: Return in about 6 months (around 10/11/2023) for Chronic medical conditions.       Hoy Register, MD, FAAFP. [facility] [place], [place] [phone]   04/12/2023, 10:39 AM

## 2023-04-12 NOTE — Patient Instructions (Signed)
VISIT SUMMARY:  During today's visit, we discussed your recent upper respiratory symptoms, dental issues, diabetes management, and difficulties obtaining your prescribed medications. We also reviewed your general health maintenance needs.  YOUR PLAN:  -UPPER RESPIRATORY INFECTION: You have a sore throat and body aches, likely from exposure to sick family members. Continue using Dayquil and Nyquil as needed for symptom relief.  -DENTAL INFECTION: You have tooth pain and a suspected gum infection, likely due to recent changes in your smoking habits. We prescribed Amoxicillin and referred you to a dentist for further evaluation and treatment.  -TYPE 2 DIABETES MELLITUS: Your diabetes is well controlled with a recent A1c of 6.8. Continue your current insulin regimen of Lantus and Novolog. We have also refilled your Freestyle Libre sensors for 12 months.  -ERECTILE DYSFUNCTION: You have had difficulty obtaining your prescribed Viagra. We have resent the prescription to the pharmacy. Please message [place] if you encounter further issues.  -GENERAL HEALTH MAINTENANCE: We have referred you for an annual eye exam and ordered routine blood work to monitor your overall health. Please schedule a follow-up visit in 6 months.  INSTRUCTIONS:  Please schedule a follow-up visit in 6 months. Additionally, ensure you complete your annual eye exam and routine blood work as discussed.

## 2023-04-13 LAB — BASIC METABOLIC PANEL
BUN/Creatinine Ratio: 16 (ref 9–20)
BUN: 14 mg/dL (ref 6–20)
CO2: 23 mmol/L (ref 20–29)
Calcium: 9.6 mg/dL (ref 8.7–10.2)
Chloride: 104 mmol/L (ref 96–106)
Creatinine, Ser: 0.85 mg/dL (ref 0.76–1.27)
Glucose: 84 mg/dL (ref 70–99)
Potassium: 4.6 mmol/L (ref 3.5–5.2)
Sodium: 142 mmol/L (ref 134–144)
eGFR: 114 mL/min/{1.73_m2} (ref >59)

## 2023-05-03 ENCOUNTER — Other Ambulatory Visit: Payer: Self-pay | Admitting: Family Medicine

## 2023-05-03 DIAGNOSIS — E1069 Type 1 diabetes mellitus with other specified complication: Secondary | ICD-10-CM

## 2023-05-14 ENCOUNTER — Ambulatory Visit: Payer: Medicaid Other | Admitting: Family Medicine

## 2023-09-14 ENCOUNTER — Ambulatory Visit (HOSPITAL_BASED_OUTPATIENT_CLINIC_OR_DEPARTMENT_OTHER): Admitting: Student

## 2023-09-14 ENCOUNTER — Encounter (HOSPITAL_BASED_OUTPATIENT_CLINIC_OR_DEPARTMENT_OTHER): Payer: Self-pay | Admitting: Student

## 2023-09-14 ENCOUNTER — Other Ambulatory Visit (HOSPITAL_BASED_OUTPATIENT_CLINIC_OR_DEPARTMENT_OTHER): Payer: Self-pay

## 2023-09-14 ENCOUNTER — Ambulatory Visit (HOSPITAL_BASED_OUTPATIENT_CLINIC_OR_DEPARTMENT_OTHER)

## 2023-09-14 DIAGNOSIS — M79642 Pain in left hand: Secondary | ICD-10-CM

## 2023-09-14 DIAGNOSIS — M25512 Pain in left shoulder: Secondary | ICD-10-CM | POA: Diagnosis not present

## 2023-09-14 DIAGNOSIS — M79602 Pain in left arm: Secondary | ICD-10-CM

## 2023-09-14 MED ORDER — METHYLPREDNISOLONE 4 MG PO TBPK
ORAL_TABLET | ORAL | 0 refills | Status: DC
  Filled 2023-09-14: qty 21, 6d supply, fill #0

## 2023-09-14 NOTE — Progress Notes (Signed)
 Chief Complaint: Left shoulder and hand pain    Discussed the use of AI scribe software for clinical note transcription with the patient, who gave verbal consent to proceed.  History of Present Illness Shawn Maldonado is a 38 year old male who presents with right shoulder and hand pain. Right shoulder pain has been present for two weeks, with subsequent development of right hand pain five days ago, particularly affecting the middle finger. The middle finger is swollen and tender, with severe pain impacting hand function. The hand pain is more intense than the shoulder pain. There is no history of trauma or specific triggering activity. No numbness or tingling is present, except for tingling when pushing the hand. He is right-handed and has taken time off work due to the pain. Tylenol  has not provided relief. He monitors his blood sugar regularly due to type 1 diabetes.  Surgical History:   None  PMH/PSH/Family History/Social History/Meds/Allergies:    Past Medical History:  Diagnosis Date   Diabetes mellitus without complication (HCC)    Past Surgical History:  Procedure Laterality Date   MOUTH SURGERY     Social History   Socioeconomic History   Marital status: Single    Spouse name: Not on file   Number of children: 2   Years of education: Not on file   Highest education level: GED or equivalent  Occupational History   Occupation: Works in a Science writer.  Tobacco Use   Smoking status: Former    Types: Cigarettes   Smokeless tobacco: Never  Vaping Use   Vaping status: Never Used  Substance and Sexual Activity   Alcohol use: Yes    Comment: social   Drug use: Yes    Types: Marijuana   Sexual activity: Never  Other Topics Concern   Not on file  Social History Narrative   Lives with girlfriend.     Social Drivers of Corporate investment banker Strain: Low Risk  (04/12/2023)   Overall Financial Resource Strain (CARDIA)     Difficulty of Paying Living Expenses: Not very hard  Food Insecurity: No Food Insecurity (04/12/2023)   Hunger Vital Sign    Worried About Running Out of Food in the Last Year: Never true    Ran Out of Food in the Last Year: Never true  Transportation Needs: Unmet Transportation Needs (04/12/2023)   PRAPARE - Transportation    Lack of Transportation (Medical): No    Lack of Transportation (Non-Medical): Yes  Physical Activity: Insufficiently Active (04/12/2023)   Exercise Vital Sign    Days of Exercise per Week: 5 days    Minutes of Exercise per Session: 20 min  Stress: No Stress Concern Present (04/12/2023)   [employer] of Occupational Health - Occupational Stress Questionnaire    Feeling of Stress : Only a little  Social Connections: Moderately Isolated (04/12/2023)   Social Connection and Isolation Panel [NHANES]    Frequency of Communication with Friends and Family: More than three times a week    Frequency of Social Gatherings with Friends and Family: More than three times a week    Attends Religious Services: Never    Database administrator or Organizations: No    Attends Engineer, structural: Not on file    Marital Status: Living with partner  Family History  Problem Relation Age of Onset   Stroke Father    CAD Other    No Known Allergies Current Outpatient Medications  Medication Sig Dispense Refill   methylPREDNISolone (MEDROL DOSEPAK) 4 MG TBPK tablet Take per packet instructions 21 each 0   Continuous Glucose Receiver (DEXCOM G7 RECEIVER) DEVI Check blood sugar three times daily. E10.69 1 each 0   Continuous Glucose Sensor (DEXCOM G7 SENSOR) MISC USE TO CHECK BLOOD SUGAR THREE TIMES DAILY AND CHANGE SENSORS ONCE EVERY 10 DAYS 3 each 11   gabapentin  (NEURONTIN ) 300 MG capsule Take 1 capsule (300 mg total) by mouth at bedtime. 90 capsule 1   insulin  aspart (NOVOLOG ) 100 UNIT/ML injection Inject 0-12 Units into the skin 3 (three) times daily before  meals. Per sliding scale 30 mL 6   insulin  glargine (LANTUS ) 100 UNIT/ML injection Inject 0.15 mLs (15 Units total) into the skin at bedtime. 30 mL 6   mirtazapine  (REMERON  SOL-TAB) 15 MG disintegrating tablet Take 1 tablet (15 mg total) by mouth at bedtime. 90 tablet 1   sildenafil  (VIAGRA ) 100 MG tablet Take 1 tablet (100 mg total) by mouth daily as needed for erectile dysfunction. At least 24 hours between doses 10 tablet 2   No current facility-administered medications for this visit.   No results found.  Review of Systems:   A ROS was performed including pertinent positives and negatives as documented in the HPI.  Physical Exam :   Constitutional: NAD and appears stated age Neurological: Alert and oriented Psych: Appropriate affect and cooperative There were no vitals taken for this visit.   Comprehensive Musculoskeletal Exam:    No tenderness of the cervical spine or paraspinal muscles with full cervical range of motion in all planes without discomfort.  Negative Spurling's.  Minimal tenderness with palpation about the left shoulder.  Left shoulder ROM to 160 degrees flexion, 40 degrees external rotation, and internal rotation to L4.  No notable deformity observed at the left hand and left middle finger.  No notable swelling, erythema, or ecchymosis.  Patient unable to form composite fist due to pain.  Tenderness over the left middle finger over the proximal phalanx without PIP or DIP joint tenderness.  Negative Tinel's.  Imaging:   Xray (cervical spine 4 views): Normal lordotic alignment with well preserved intervertebral disc spacing.  No acute bony abnormality.  Xray (left shoulder 3 views, left hand 3 views): Normal appearance of the left shoulder and left hand without evidence of significant bony abnormality   I personally reviewed and interpreted the radiographs.      Assessment & Plan Left shoulder and hand pain Patient has been experiencing acute left shoulder pain  with recent development of pain and discomfort along the left middle finger.  His shoulder pain and range of motion have improved some over the past few days.  Pain does appear likely more neuropathic in nature given the characteristic and sudden onset without injury.  There is potential for cervical radiculopathy although he is not experiencing any neck pain and cervical exam today is negative.  Discussed that I would like to get him on a oral steroid to help address inflammation and monitor his response to see if this improves his symptoms.  He is a type I diabetic and constantly monitors his blood sugar, so I did discuss an expected increase in his levels.  Will plan to see him back if symptoms continue to persist or worsen.      I  personally saw and evaluated the patient, and participated in the management and treatment plan.  Sharrell Deck, PA-C Orthopedics

## 2023-09-20 ENCOUNTER — Other Ambulatory Visit (HOSPITAL_COMMUNITY): Payer: Self-pay

## 2023-10-11 ENCOUNTER — Ambulatory Visit: Payer: Medicaid Other | Admitting: Family Medicine

## 2023-10-30 ENCOUNTER — Telehealth: Payer: Self-pay | Admitting: Family Medicine

## 2023-10-30 DIAGNOSIS — E1069 Type 1 diabetes mellitus with other specified complication: Secondary | ICD-10-CM

## 2023-10-30 MED ORDER — INSULIN GLARGINE 100 UNIT/ML ~~LOC~~ SOLN: 15.0000 [IU] | Freq: Every day | SUBCUTANEOUS | 6 refills | Status: DC

## 2023-10-30 NOTE — Telephone Encounter (Signed)
 Refill sent to pharmacy.

## 2023-10-30 NOTE — Telephone Encounter (Signed)
 Patient missed last appointment but has appointment rescheduled for 11/27/2023 patient is needing refill for Lantus  he was able to get Novolog  and dexcom refilled but not other medication.

## 2023-11-23 ENCOUNTER — Telehealth: Payer: Self-pay | Admitting: Family Medicine

## 2023-11-23 NOTE — Telephone Encounter (Signed)
 Called patient, no answer. Left voicemail confirming upcoming appointment on 11/27/2023 at 10:50 am with Dr.Newlin. Provided callback number for any questions or changes.

## 2023-11-27 ENCOUNTER — Ambulatory Visit: Admitting: Family Medicine

## 2023-12-22 ENCOUNTER — Emergency Department (HOSPITAL_COMMUNITY)
Admission: EM | Admit: 2023-12-22 | Discharge: 2023-12-22 | Disposition: A | Discharge status: Home / Self Care | Payer: Self-pay | Attending: Emergency Medicine | Admitting: Emergency Medicine

## 2023-12-22 ENCOUNTER — Emergency Department (HOSPITAL_COMMUNITY): Payer: Self-pay

## 2023-12-22 DIAGNOSIS — E1165 Type 2 diabetes mellitus with hyperglycemia: Secondary | ICD-10-CM | POA: Diagnosis not present

## 2023-12-22 DIAGNOSIS — Z794 Long term (current) use of insulin: Secondary | ICD-10-CM | POA: Insufficient documentation

## 2023-12-22 DIAGNOSIS — R1032 Left lower quadrant pain: Secondary | ICD-10-CM | POA: Diagnosis present

## 2023-12-22 DIAGNOSIS — R112 Nausea with vomiting, unspecified: Secondary | ICD-10-CM

## 2023-12-22 LAB — COMPREHENSIVE METABOLIC PANEL WITH GFR
ALT: 69 U/L — ABNORMAL HIGH (ref 0–44)
AST: 57 U/L — ABNORMAL HIGH (ref 15–41)
Albumin: 4.7 g/dL (ref 3.5–5.0)
Alkaline Phosphatase: 95 U/L (ref 38–126)
Anion gap: 16 — ABNORMAL HIGH (ref 5–15)
BUN: 22 mg/dL — ABNORMAL HIGH (ref 6–20)
CO2: 22 mmol/L (ref 22–32)
Calcium: 9.9 mg/dL (ref 8.9–10.3)
Chloride: 97 mmol/L — ABNORMAL LOW (ref 98–111)
Creatinine, Ser: 1.02 mg/dL (ref 0.61–1.24)
GFR, Estimated: >60 mL/min (ref >60)
Glucose, Bld: 211 mg/dL — ABNORMAL HIGH (ref 70–99)
Potassium: 3.4 mmol/L — ABNORMAL LOW (ref 3.5–5.1)
Sodium: 135 mmol/L (ref 135–145)
Total Bilirubin: 1.7 mg/dL — ABNORMAL HIGH (ref 0.0–1.2)
Total Protein: 8.5 g/dL — ABNORMAL HIGH (ref 6.5–8.1)

## 2023-12-22 LAB — CBC WITH DIFFERENTIAL/PLATELET
Abs Immature Granulocytes: 0.04 K/uL (ref 0.00–0.07)
Basophils Absolute: 0 K/uL (ref 0.0–0.1)
Basophils Relative: 0 %
Eosinophils Absolute: 0 K/uL (ref 0.0–0.5)
Eosinophils Relative: 0 %
HCT: 43.9 % (ref 39.0–52.0)
Hemoglobin: 14.7 g/dL (ref 13.0–17.0)
Immature Granulocytes: 0 %
Lymphocytes Relative: 9 %
Lymphs Abs: 1.5 K/uL (ref 0.7–4.0)
MCH: 30.4 pg (ref 26.0–34.0)
MCHC: 33.5 g/dL (ref 30.0–36.0)
MCV: 90.9 fL (ref 80.0–100.0)
Monocytes Absolute: 0.6 K/uL (ref 0.1–1.0)
Monocytes Relative: 4 %
Neutro Abs: 14.4 K/uL — ABNORMAL HIGH (ref 1.7–7.7)
Neutrophils Relative %: 87 %
Platelets: 292 K/uL (ref 150–400)
RBC: 4.83 MIL/uL (ref 4.22–5.81)
RDW: 12.8 % (ref 11.5–15.5)
WBC: 16.6 K/uL — ABNORMAL HIGH (ref 4.0–10.5)
nRBC: 0 % (ref 0.0–0.2)

## 2023-12-22 LAB — BLOOD GAS, VENOUS
Acid-Base Excess: 4.5 mmol/L — ABNORMAL HIGH (ref 0.0–2.0)
Bicarbonate: 26 mmol/L (ref 20.0–28.0)
O2 Saturation: 82.4 %
Patient temperature: 37
pCO2, Ven: 29 mmHg — ABNORMAL LOW (ref 44–60)
pH, Ven: 7.56 — ABNORMAL HIGH (ref 7.25–7.43)
pO2, Ven: 44 mmHg (ref 32–45)

## 2023-12-22 LAB — URINALYSIS, ROUTINE W REFLEX MICROSCOPIC
Bacteria, UA: NONE SEEN
Bilirubin Urine: NEGATIVE
Glucose, UA: >=500 mg/dL — AB
Hgb urine dipstick: NEGATIVE
Ketones, ur: 20 mg/dL — AB
Leukocytes,Ua: NEGATIVE
Nitrite: NEGATIVE
Protein, ur: NEGATIVE mg/dL
Specific Gravity, Urine: >1.046 — ABNORMAL HIGH (ref 1.005–1.030)
pH: 5 (ref 5.0–8.0)

## 2023-12-22 LAB — MAGNESIUM: Magnesium: 2.1 mg/dL (ref 1.7–2.4)

## 2023-12-22 LAB — LIPASE, BLOOD: Lipase: 25 U/L (ref 11–51)

## 2023-12-22 LAB — CBG MONITORING, ED: Glucose-Capillary: 258 mg/dL — ABNORMAL HIGH (ref 70–99)

## 2023-12-22 LAB — SARS CORONAVIRUS 2 BY RT PCR: SARS Coronavirus 2 by RT PCR: NEGATIVE

## 2023-12-22 LAB — BETA-HYDROXYBUTYRIC ACID: Beta-Hydroxybutyric Acid: 1.35 mmol/L — ABNORMAL HIGH (ref 0.05–0.27)

## 2023-12-22 MED ORDER — SODIUM CHLORIDE 0.9 % IV BOLUS
1000.0000 mL | Freq: Once | INTRAVENOUS | Status: AC
  Administered 2023-12-22: 1000 mL via INTRAVENOUS

## 2023-12-22 MED ORDER — IOHEXOL 300 MG/ML  SOLN
100.0000 mL | Freq: Once | INTRAMUSCULAR | Status: AC | PRN
  Administered 2023-12-22: 100 mL via INTRAVENOUS

## 2023-12-22 MED ORDER — ALPRAZOLAM 0.5 MG PO TABS: 0.5000 mg | ORAL_TABLET | Freq: Every evening | ORAL | 0 refills | Status: DC | PRN

## 2023-12-22 MED ORDER — FAMOTIDINE IN NACL 20-0.9 MG/50ML-% IV SOLN
20.0000 mg | Freq: Once | INTRAVENOUS | Status: AC
  Administered 2023-12-22: 20 mg via INTRAVENOUS
  Filled 2023-12-22: qty 50

## 2023-12-22 MED ORDER — ONDANSETRON HCL 4 MG/2ML IJ SOLN
4.0000 mg | Freq: Once | INTRAMUSCULAR | Status: AC
  Administered 2023-12-22: 4 mg via INTRAVENOUS
  Filled 2023-12-22: qty 2

## 2023-12-22 MED ORDER — PANTOPRAZOLE SODIUM 20 MG PO TBEC: 20.0000 mg | DELAYED_RELEASE_TABLET | Freq: Every day | ORAL | 0 refills | Status: DC

## 2023-12-22 MED ORDER — ONDANSETRON HCL 4 MG PO TABS: 4.0000 mg | ORAL_TABLET | Freq: Three times a day (TID) | ORAL | 0 refills | Status: DC | PRN

## 2023-12-22 NOTE — ED Notes (Signed)
 Pt to CT

## 2023-12-22 NOTE — ED Triage Notes (Signed)
 Patient c/o elevated blood glucose for past several days reporting it has ran in the 400's Endorses nausea/vomiting Unable to eat Headache and says he feels real off right now Per patient he has been admitted several times for elevated blood glucose Patient also states his wife and daughter just traveled in from another country and both have tested positive for covid

## 2023-12-22 NOTE — Discharge Instructions (Addendum)
 You were seen in the emergency department for nausea and vomiting elevated blood sugar.  You had lab work, urinalysis, CAT scan that did not show a definite explanation for your symptoms.  You were given fluids and some medication for your stomach with some improvement.  Please continue clear liquid diet and advance as tolerated.  We are prescribing you some acid medication and nausea medication.  Follow-up with your primary care doctor.  Return to the emergency department if any worsening or concerning symptoms.

## 2023-12-22 NOTE — ED Provider Notes (Signed)
 [place] EMERGENCY DEPARTMENT AT [facility] Provider Note   CSN: [id number] Arrival date & time: 12/22/23  1339     Patient presents with: Hyperglycemia   Shawn Maldonado is a 38 y.o. male.  He is presenting with elevated blood sugar nausea and vomiting, left lower quadrant abdominal pain it has been going on for 3 days.  He said his wife and daughter just returned from [place] 5 days ago and tested positive for COVID.  He has tried to not be around them much and is not having any cough or fever.  He did endorse alcohol and marijuana and he said he had a history in the past of having cannabis hyperemesis syndrome.  He has been trying to tolerate p.o. but cannot get much in him.  Blood sugars have been in the 400s.  He continues to use his insulin  though.  No blood in the vomitus.  {Add pertinent medical, surgical, social history, OB history to YEP:67052} The history is provided by the patient.  Abdominal Pain Pain location:  LLQ Pain quality: aching   Pain severity:  Moderate Onset quality:  Gradual Duration:  3 days Timing:  Intermittent Progression:  Unchanged Chronicity:  New Context: alcohol use   Relieved by:  None tried Associated symptoms: nausea and vomiting   Associated symptoms: no chest pain, no cough, no diarrhea, no dysuria, no fever, no hematemesis and no shortness of breath        Prior to Admission medications   Medication Sig Start Date End Date Taking? Authorizing Provider  Continuous Glucose Receiver (DEXCOM G7 RECEIVER) DEVI Check blood sugar three times daily. E10.69 04/06/23   Delbert Clam, MD  Continuous Glucose Sensor (DEXCOM G7 SENSOR) MISC USE TO CHECK BLOOD SUGAR THREE TIMES DAILY AND CHANGE SENSORS ONCE EVERY 10 DAYS 05/03/23   Newlin, Enobong, MD  gabapentin  (NEURONTIN ) 300 MG capsule Take 1 capsule (300 mg total) by mouth at bedtime. 05/31/22   Newlin, Enobong, MD  insulin  aspart (NOVOLOG ) 100 UNIT/ML injection Inject 0-12 Units  into the skin 3 (three) times daily before meals. Per sliding scale 04/12/23   Newlin, Enobong, MD  insulin  glargine (LANTUS ) 100 UNIT/ML injection Inject 0.15 mLs (15 Units total) into the skin at bedtime. 10/30/23   Newlin, Enobong, MD  methylPREDNISolone  (MEDROL  DOSEPAK) 4 MG TBPK tablet Take per packet instructions 09/14/23   Emiliano Leonce CROME, PA-C  mirtazapine  (REMERON  SOL-TAB) 15 MG disintegrating tablet Take 1 tablet (15 mg total) by mouth at bedtime. 01/11/23   Newlin, Enobong, MD  sildenafil  (VIAGRA ) 100 MG tablet Take 1 tablet (100 mg total) by mouth daily as needed for erectile dysfunction. At least 24 hours between doses 04/12/23   Newlin, Enobong, MD    Allergies: Patient has no known allergies.    Review of Systems  Constitutional:  Negative for fever.  Eyes:  Negative for visual disturbance.  Respiratory:  Negative for cough and shortness of breath.   Cardiovascular:  Negative for chest pain.  Gastrointestinal:  Positive for abdominal pain, nausea and vomiting. Negative for diarrhea and hematemesis.  Genitourinary:  Negative for dysuria.    Updated Vital Signs BP (!) 142/86   Pulse (!) 121   Temp 99.6 F (37.6 C) (Oral)   Resp 18   Ht 5' 10 (1.778 m)   Wt 54.4 kg   SpO2 100%   BMI 17.22 kg/m   Physical Exam Vitals and nursing note reviewed.  Constitutional:      General:  He is not in acute distress.    Appearance: Normal appearance. He is well-developed.  HENT:     Head: Normocephalic and atraumatic.  Eyes:     Conjunctiva/sclera: Conjunctivae normal.  Cardiovascular:     Rate and Rhythm: Regular rhythm. Tachycardia present.     Heart sounds: No murmur heard. Pulmonary:     Effort: Pulmonary effort is normal. No respiratory distress.     Breath sounds: Normal breath sounds.  Abdominal:     Palpations: Abdomen is soft.     Tenderness: There is no abdominal tenderness. There is no guarding or rebound.  Musculoskeletal:        General: No tenderness or  deformity.     Cervical back: Neck supple.  Skin:    General: Skin is warm and dry.     Capillary Refill: Capillary refill takes less than 2 seconds.  Neurological:     Mental Status: He is alert.     Motor: No weakness.  Psychiatric:        Mood and Affect: Mood normal.     (all labs ordered are listed, but only abnormal results are displayed) Labs Reviewed  SARS CORONAVIRUS 2 BY RT PCR  COMPREHENSIVE METABOLIC PANEL WITH GFR  LIPASE, BLOOD  URINALYSIS, ROUTINE W REFLEX MICROSCOPIC  BETA-HYDROXYBUTYRIC ACID  BLOOD GAS, VENOUS  CBC WITH DIFFERENTIAL/PLATELET    EKG: None  Radiology: No results found.  {Document cardiac monitor, telemetry assessment procedure when appropriate:32947} Procedures   Medications Ordered in the ED  sodium chloride  0.9 % bolus 1,000 mL (has no administration in time range)  ondansetron  (ZOFRAN ) injection 4 mg (has no administration in time range)  famotidine  (PEPCID ) IVPB 20 mg premix (has no administration in time range)      {Click here for ABCD2, HEART and other calculators REFRESH Note before signing:1}                              Medical Decision Making Amount and/or Complexity of Data Reviewed Labs: ordered.  Risk Prescription drug management.   This patient complains of ***; this involves an extensive number of treatment Options and is a complaint that carries with it a high risk of complications and morbidity. The differential includes ***  I ordered, reviewed and interpreted labs, which included *** I ordered medication *** and reviewed PMP when indicated. I ordered imaging studies which included *** and I independently    visualized and interpreted imaging which showed *** Additional history obtained from *** Previous records obtained and reviewed *** I consulted *** and discussed lab and imaging findings and discussed disposition.  Cardiac monitoring reviewed, *** Social determinants considered, *** Critical  Interventions: ***  After the interventions stated above, I reevaluated the patient and found *** Admission and further testing considered, ***   {Document critical care time when appropriate  Document review of labs and clinical decision tools ie CHADS2VASC2, etc  Document your independent review of radiology images and any outside records  Document your discussion with family members, caretakers and with consultants  Document social determinants of health affecting pt's care  Document your decision making why or why not admission, treatments were needed:32947:::1}   Final diagnoses:  None    ED Discharge Orders     None

## 2023-12-26 ENCOUNTER — Other Ambulatory Visit: Payer: Self-pay

## 2023-12-28 ENCOUNTER — Telehealth: Payer: Self-pay | Admitting: Family Medicine

## 2023-12-28 NOTE — Telephone Encounter (Signed)
 Pt confirmed appt 8/29

## 2024-01-01 ENCOUNTER — Ambulatory Visit: Attending: Family Medicine | Admitting: Family Medicine

## 2024-01-01 VITALS — BP 105/61 | HR 100 | Ht 70.0 in | Wt 132.8 lb

## 2024-01-01 DIAGNOSIS — F419 Anxiety disorder, unspecified: Secondary | ICD-10-CM

## 2024-01-01 DIAGNOSIS — E1069 Type 1 diabetes mellitus with other specified complication: Secondary | ICD-10-CM | POA: Diagnosis not present

## 2024-01-01 LAB — POCT GLYCOSYLATED HEMOGLOBIN (HGB A1C): HbA1c, POC (controlled diabetic range): 9.7 % — AB (ref 0.0–7.0)

## 2024-01-01 MED ORDER — HYDROXYZINE HCL 25 MG PO TABS: 25.0000 mg | ORAL_TABLET | Freq: Three times a day (TID) | ORAL | 1 refills | Status: DC | PRN

## 2024-01-01 MED ORDER — INSULIN ASPART 100 UNIT/ML IJ SOLN: 0.0000 [IU] | Freq: Three times a day (TID) | INTRAMUSCULAR | 6 refills | Status: AC

## 2024-01-01 MED ORDER — INSULIN GLARGINE 100 UNIT/ML ~~LOC~~ SOLN: 15.0000 [IU] | Freq: Every day | SUBCUTANEOUS | 6 refills | Status: DC

## 2024-01-01 MED ORDER — DEXCOM G7 SENSOR MISC: 11 refills | Status: DC

## 2024-01-01 NOTE — Patient Instructions (Signed)
 VISIT SUMMARY:  During your visit, we discussed your challenges with managing your blood sugar levels due to running out of insulin  and your recent episode of food poisoning. We also addressed your anxiety and the effectiveness of your current medications.  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 Your recent increase in A1c from 6.8% to 9.7% indicates poor blood sugar control, partly due to running out of your long-acting insulin , Lantus . We have refilled your prescriptions for Lantus  and Novolog . Please send a MyChart message if you have trouble getting your refills. We will follow up in 3 months to check on your A1c levels. Your Dexcom prescription and refills are also ensured.  -ANXIETY DISORDER: Anxiety disorder involves feelings of worry and irritability. While Xanax  has been effective for you, it is not suitable for long-term use. We have prescribed hydroxyzine , which you can take up to three times daily as needed. Be aware that it can cause drowsiness, so avoid driving if you feel drowsy.  INSTRUCTIONS:  Please follow up in 3 months to check on your A1c levels. If you have trouble getting your insulin  refills, send a MyChart message.

## 2024-01-01 NOTE — Progress Notes (Signed)
 Subjective:  Patient ID: Shawn Maldonado, male    DOB: 1986-04-15  Age: 38 y.o. MRN: [id number]  CC: Medical Management of Chronic Issues     Discussed the use of AI scribe software for clinical note transcription with the patient, who gave verbal consent to proceed.  History of Present Illness JADARIOUS Maldonado is a 38 year old male with Type 1 diabetes who presents with difficulty managing blood sugar levels due to lack of insulin  refills.  He has been unable to manage his blood sugar levels effectively due to running out of his long-acting insulin , Lantus , after missing his last appointment. He manages his blood sugar during the day, but it rises at night, leading to elevated levels upon waking. His A1c has increased from 6.8% to 9.7%. He was taking 15 units of Lantus  and using Novolog  on a sliding scale but has not refilled his prescriptions due to insurance issues and the need to visit social services.  He experienced a recent episode of food poisoning, leading to a hospital visit where he was given Xanax  0.5 mg for anxiety. He feels calmer and less irritable with Xanax . He has not been taking gabapentin  regularly, which was prescribed for neuropathy. Gabapentin  did not alleviate his anxiety, but Xanax  did.  He uses a Dexcom for monitoring his blood sugar levels and believes his prescription for it was recently filled. He manages his condition while balancing work and family responsibilities.    Past Medical History:  Diagnosis Date   Diabetes mellitus without complication (HCC)     Past Surgical History:  Procedure Laterality Date   MOUTH SURGERY      Family History  Problem Relation Age of Onset   Stroke Father    CAD Other     Social History   Socioeconomic History   Marital status: Single    Spouse name: Not on file   Number of children: 2   Years of education: Not on file   Highest education level: GED or equivalent  Occupational History   Occupation:  Works in a Science writer.  Tobacco Use   Smoking status: Former    Types: Cigarettes   Smokeless tobacco: Never  Vaping Use   Vaping status: Never Used  Substance and Sexual Activity   Alcohol use: Yes    Comment: social   Drug use: Yes    Types: Marijuana   Sexual activity: Never  Other Topics Concern   Not on file  Social History Narrative   Lives with girlfriend.     Social Drivers of Corporate investment banker Strain: Low Risk  (01/01/2024)   Overall Financial Resource Strain (CARDIA)    Difficulty of Paying Living Expenses: Not very hard  Food Insecurity: No Food Insecurity (01/01/2024)   Hunger Vital Sign    Worried About Running Out of Food in the Last Year: Never true    Ran Out of Food in the Last Year: Never true  Transportation Needs: No Transportation Needs (01/01/2024)   PRAPARE - Administrator, Civil Service (Medical): No    Lack of Transportation (Non-Medical): No  Physical Activity: Insufficiently Active (01/01/2024)   Exercise Vital Sign    Days of Exercise per Week: 3 days    Minutes of Exercise per Session: 20 min  Stress: Stress Concern Present (01/01/2024)   [employer] of Occupational Health - Occupational Stress Questionnaire    Feeling of Stress: To some extent  Social Connections: Moderately Isolated (01/01/2024)  Social Connection and Isolation Panel    Frequency of Communication with Friends and Family: Once a week    Frequency of Social Gatherings with Friends and Family: More than three times a week    Attends Religious Services: Patient declined    Database administrator or Organizations: No    Attends Engineer, structural: Not on file    Marital Status: Living with partner    No Known Allergies  Outpatient Medications Prior to Visit  Medication Sig Dispense Refill   ALPRAZolam  (XANAX ) 0.5 MG tablet Take 1 tablet (0.5 mg total) by mouth at bedtime as needed for anxiety. 6 tablet 0   Continuous Glucose Receiver  (DEXCOM G7 RECEIVER) DEVI Check blood sugar three times daily. E10.69 1 each 0   sildenafil  (VIAGRA ) 100 MG tablet Take 1 tablet (100 mg total) by mouth daily as needed for erectile dysfunction. At least 24 hours between doses 10 tablet 2   Continuous Glucose Sensor (DEXCOM G7 SENSOR) MISC USE TO CHECK BLOOD SUGAR THREE TIMES DAILY AND CHANGE SENSORS ONCE EVERY 10 DAYS 3 each 11   insulin  aspart (NOVOLOG ) 100 UNIT/ML injection Inject 0-12 Units into the skin 3 (three) times daily before meals. Per sliding scale 30 mL 6   insulin  glargine (LANTUS ) 100 UNIT/ML injection Inject 0.15 mLs (15 Units total) into the skin at bedtime. 30 mL 6   gabapentin  (NEURONTIN ) 300 MG capsule Take 1 capsule (300 mg total) by mouth at bedtime. (Patient not taking: Reported on 12/22/2023) 90 capsule 1   methylPREDNISolone  (MEDROL  DOSEPAK) 4 MG TBPK tablet Take per packet instructions (Patient not taking: Reported on 01/01/2024) 21 each 0   mirtazapine  (REMERON  SOL-TAB) 15 MG disintegrating tablet Take 1 tablet (15 mg total) by mouth at bedtime. (Patient not taking: Reported on 12/22/2023) 90 tablet 1   ondansetron  (ZOFRAN ) 4 MG tablet Take 1 tablet (4 mg total) by mouth every 8 (eight) hours as needed for nausea or vomiting. 15 tablet 0   pantoprazole  (PROTONIX ) 20 MG tablet Take 1 tablet (20 mg total) by mouth daily. 30 tablet 0   No facility-administered medications prior to visit.     ROS Review of Systems  Constitutional:  Negative for activity change and appetite change.  HENT:  Negative for sinus pressure and sore throat.   Respiratory:  Negative for chest tightness, shortness of breath and wheezing.   Cardiovascular:  Negative for chest pain and palpitations.  Gastrointestinal:  Negative for abdominal distention, abdominal pain and constipation.  Genitourinary: Negative.   Musculoskeletal: Negative.   Psychiatric/Behavioral:  Negative for behavioral problems and dysphoric mood.     Objective:  BP 105/61    Pulse 100   Ht 5' 10 (1.778 m)   Wt 132 lb 12.8 oz (60.2 kg)   SpO2 99%   BMI 19.05 kg/m      01/01/2024    3:11 PM 12/22/2023    6:43 PM 12/22/2023    3:45 PM  BP/Weight  Systolic BP 105 123 135  Diastolic BP 61 73 91  Wt. (Lbs) 132.8    BMI 19.05 kg/m2        Physical Exam Constitutional:      Appearance: He is well-developed.  Cardiovascular:     Rate and Rhythm: Normal rate.     Heart sounds: Normal heart sounds. No murmur heard. Pulmonary:     Effort: Pulmonary effort is normal.     Breath sounds: Normal breath sounds. No wheezing or rales.  Chest:     Chest wall: No tenderness.  Abdominal:     General: Bowel sounds are normal. There is no distension.     Palpations: Abdomen is soft. There is no mass.     Tenderness: There is no abdominal tenderness.  Musculoskeletal:        General: Normal range of motion.     Right lower leg: No edema.     Left lower leg: No edema.  Neurological:     Mental Status: He is alert and oriented to person, place, and time.  Psychiatric:        Mood and Affect: Mood normal.        Latest Ref Rng & Units 12/22/2023    2:22 PM 04/12/2023   12:00 PM 01/11/2023   10:07 AM  CMP  Glucose 70 - 99 mg/dL 788  84    BUN 6 - 20 mg/dL 22  14    Creatinine 9.38 - 1.24 mg/dL 8.97  9.14    Sodium 864 - 145 mmol/L 135  142    Potassium 3.5 - 5.1 mmol/L 3.4  4.6  4.3   Chloride 98 - 111 mmol/L 97  104    CO2 22 - 32 mmol/L 22  23    Calcium  8.9 - 10.3 mg/dL 9.9  9.6    Total Protein 6.5 - 8.1 g/dL 8.5     Total Bilirubin 0.0 - 1.2 mg/dL 1.7     Alkaline Phos 38 - 126 U/L 95     AST 15 - 41 U/L 57     ALT 0 - 44 U/L 69       Lipid Panel     Component Value Date/Time   CHOL 168 01/11/2023 1007   TRIG 62 01/11/2023 1007   HDL 81 01/11/2023 1007   CHOLHDL 2.8 07/28/2018 1042   VLDL 9 07/28/2018 1042   LDLCALC 75 01/11/2023 1007    CBC    Component Value Date/Time   WBC 16.6 (H) 12/22/2023 1422   RBC 4.83 12/22/2023 1422   HGB  14.7 12/22/2023 1422   HCT 43.9 12/22/2023 1422   PLT 292 12/22/2023 1422   MCV 90.9 12/22/2023 1422   MCH 30.4 12/22/2023 1422   MCHC 33.5 12/22/2023 1422   RDW 12.8 12/22/2023 1422   LYMPHSABS 1.5 12/22/2023 1422   MONOABS 0.6 12/22/2023 1422   EOSABS 0.0 12/22/2023 1422   BASOSABS 0.0 12/22/2023 1422    Lab Results  Component Value Date   HGBA1C 9.7 (A) 01/01/2024    Lab Results  Component Value Date   HGBA1C 9.7 (A) 01/01/2024   HGBA1C 6.8 04/12/2023   HGBA1C 6.7 01/11/2023       Assessment & Plan Type 1 diabetes mellitus with poor glycemic control and diabetic neuropathy Recent poor glycemic control with A1c increase from 6.8 to 9.7 due to Lantus  prescription issues. Diabetic neuropathy present, managed with intermittent gabapentin . - Refilled Lantus  and Novolog  prescriptions. -Ensure good he takes his gabapentin  consistently due to neuropathy - Advised to send MyChart message if unable to obtain refills. - Scheduled follow-up in 3 months for elevated A1c. - Ensured Dexcom prescription and refills available.  Anxiety disorder Anxiety symptoms include irritability and feeling on edge. Xanax  which she received at the ED effective but not suitable long-term. Gabapentin  ineffective for anxiety. Hydroxyzine  suggested as alternative. - Prescribed hydroxyzine  as needed up to three times daily. - Discussed drowsiness side effect and advised against driving if drowsy.  Meds ordered this encounter  Medications   insulin  aspart (NOVOLOG ) 100 UNIT/ML injection    Sig: Inject 0-12 Units into the skin 3 (three) times daily before meals. Per sliding scale    Dispense:  30 mL    Refill:  6    For blood sugars 0-150 give 0 units of insulin , 151-200 give 2 units of insulin , 201-250 give 4 units, 251-300 give 6 units, 301-350 give 8 units, 351-400 give 10 units,> 400 give 12 units and call M.D.   insulin  glargine (LANTUS ) 100 UNIT/ML injection    Sig: Inject 0.15 mLs (15  Units total) into the skin at bedtime.    Dispense:  30 mL    Refill:  6   hydrOXYzine  (ATARAX ) 25 MG tablet    Sig: Take 1 tablet (25 mg total) by mouth every 8 (eight) hours as needed for anxiety.    Dispense:  60 tablet    Refill:  1   Continuous Glucose Sensor (DEXCOM G7 SENSOR) MISC    Sig: USE TO CHECK BLOOD SUGAR THREE TIMES DAILY AND CHANGE SENSORS ONCE EVERY 10 DAYS    Dispense:  3 each    Refill:  11    Follow-up: Return in about 3 months (around 04/01/2024) for Chronic medical conditions.       Corrina Sabin, MD, FAAFP. [facility] [place], [place] [phone]   01/01/2024, 5:43 PM

## 2024-01-02 ENCOUNTER — Other Ambulatory Visit: Payer: Self-pay

## 2024-01-02 ENCOUNTER — Telehealth: Payer: Self-pay

## 2024-01-02 NOTE — Telephone Encounter (Signed)
 Pharmacy Patient Advocate Encounter   Received notification from CoverMyMeds that prior authorization for [facility] G7 SENSOR is required/requested.   Insurance verification completed.   The patient is insured through [employer] .   Per test claim: PA required; PA submitted to above mentioned insurance via CoverMyMeds Key/confirmation #/EOC [id number] Status is pending

## 2024-01-03 ENCOUNTER — Other Ambulatory Visit: Payer: Self-pay

## 2024-01-04 ENCOUNTER — Other Ambulatory Visit: Payer: Self-pay | Admitting: Pharmacist

## 2024-01-04 MED ORDER — ACCU-CHEK GUIDE W/DEVICE KIT: PACK | 0 refills | Status: AC

## 2024-01-04 MED ORDER — ACCU-CHEK GUIDE TEST VI STRP: ORAL_STRIP | 6 refills | Status: DC

## 2024-01-04 MED ORDER — ACCU-CHEK SOFTCLIX LANCETS MISC: 6 refills | Status: DC

## 2024-01-21 ENCOUNTER — Other Ambulatory Visit: Payer: Self-pay

## 2024-01-29 ENCOUNTER — Other Ambulatory Visit: Payer: Self-pay

## 2024-01-30 IMAGING — CT CT ABD-PELV W/ CM
2 of 4 series · 16 of 46 positions shown, 18 images · IV contrast (agent unspecified)
Comparison: None.

CLINICAL DATA: Abdominal pain, acute, nonlocalized.  Vomiting.

EXAM:
CT ABDOMEN AND PELVIS WITH CONTRAST
TECHNIQUE: Multidetector CT imaging of the abdomen and pelvis was performed
using the standard protocol following bolus administration of
intravenous contrast.

[Series 2: axial st · axial · 0.65mm/px · z∈[-488,-74]mm · 13 of 93 slices shown, 15 images]
[im 5/93  soft-tissue]
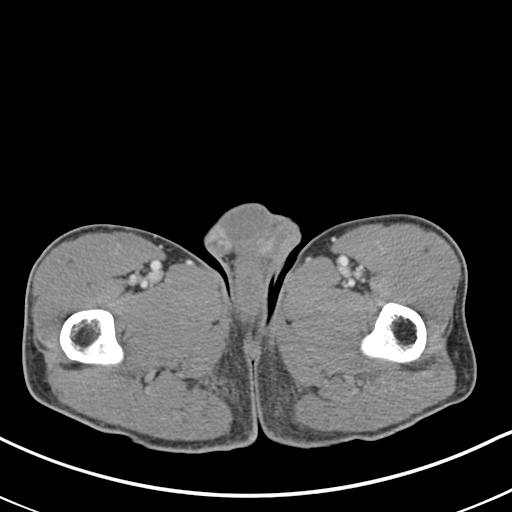
[im 5/93  bone]
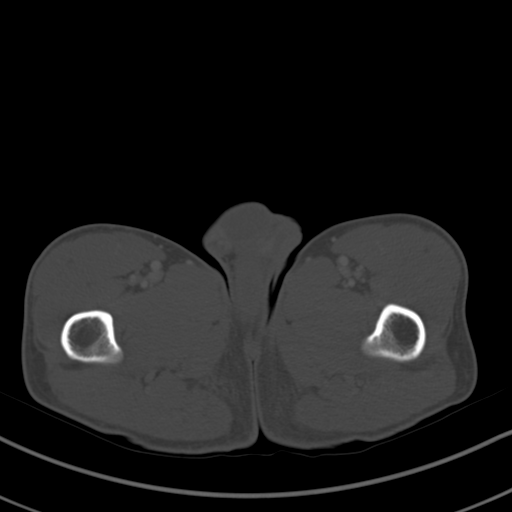
[im 15/93  soft-tissue]
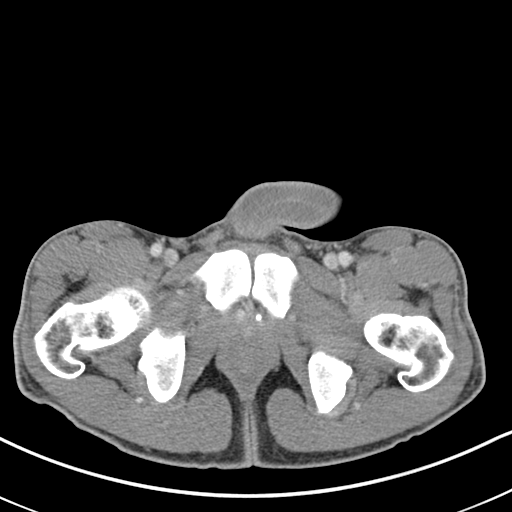
[im 20/93  soft-tissue]
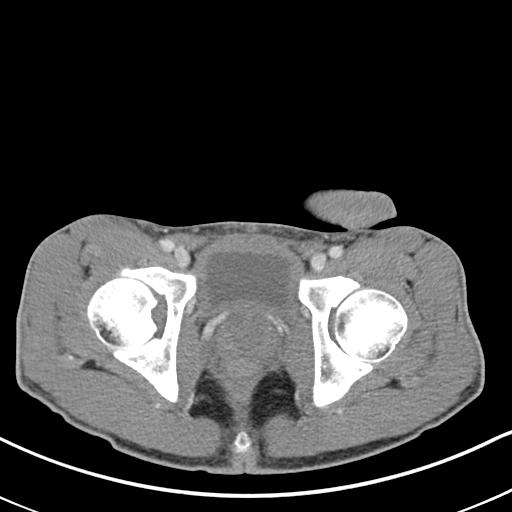
[im 25/93  soft-tissue]
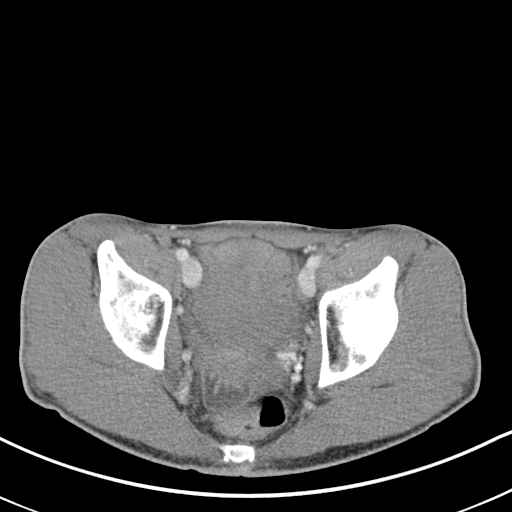
[im 34/93  soft-tissue]
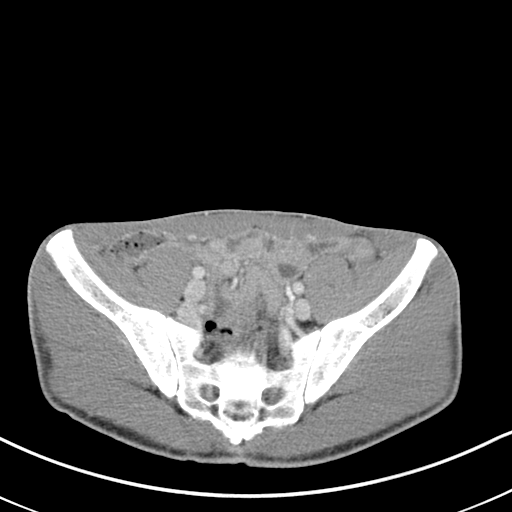
[im 39/93  soft-tissue]
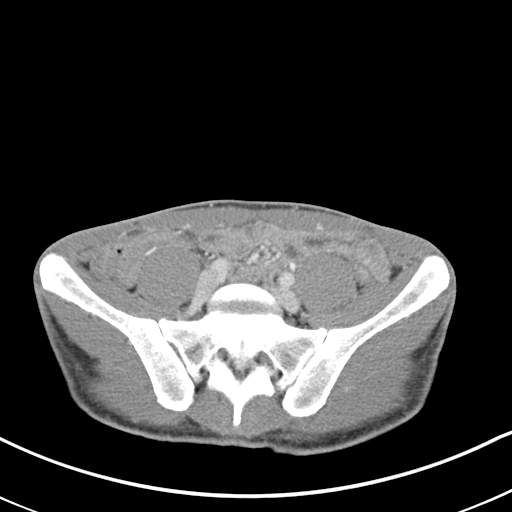
[im 49/93  soft-tissue]
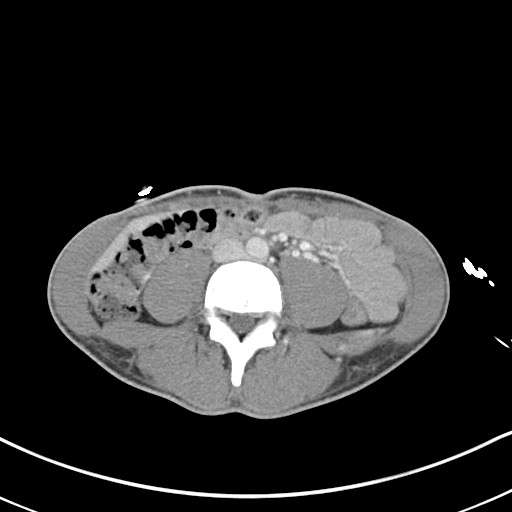
[im 54/93  soft-tissue]
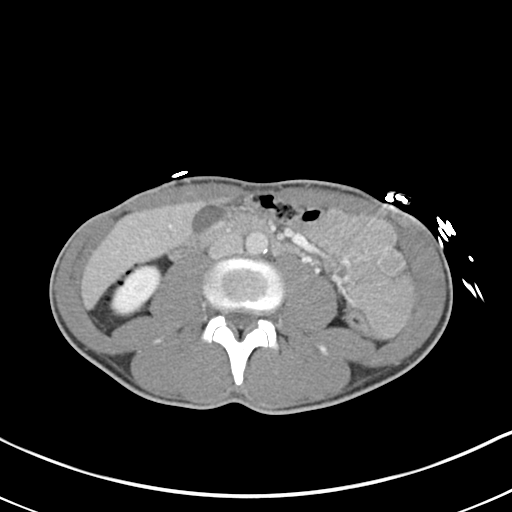
[im 59/93  soft-tissue]
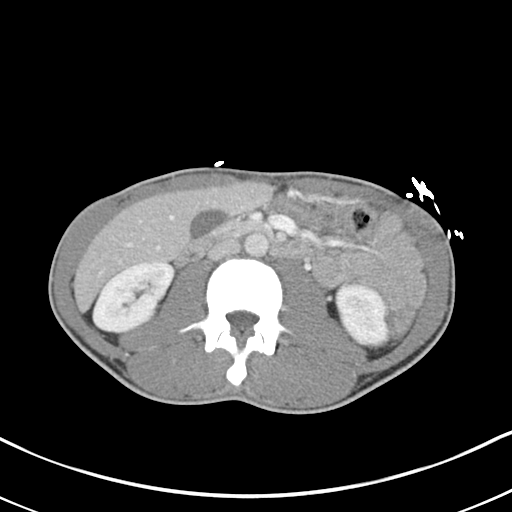
[im 59/93  bone]
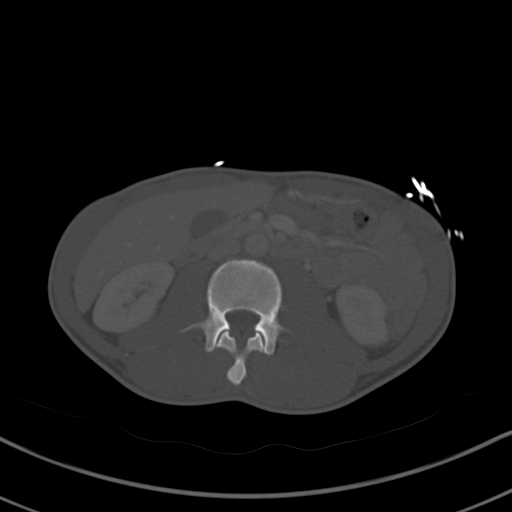
[im 68/93  soft-tissue]
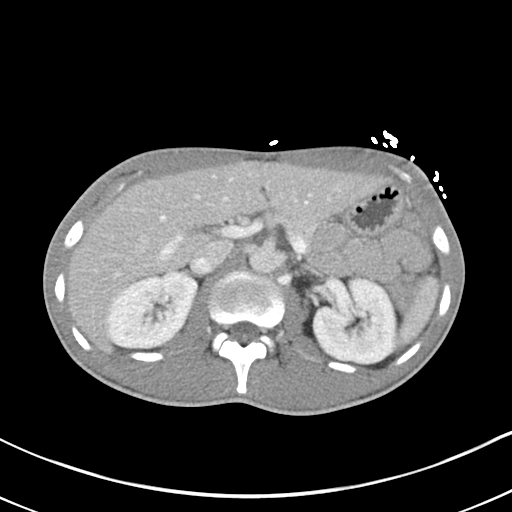
[im 73/93  soft-tissue]
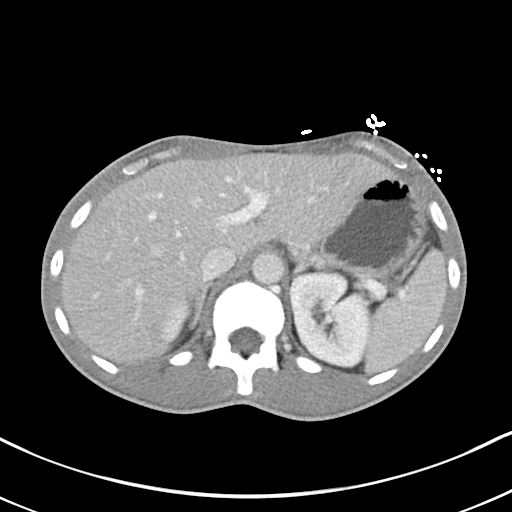
[im 78/93  soft-tissue]
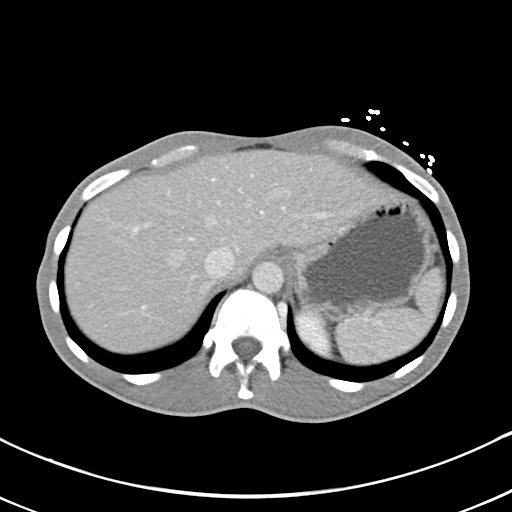
[im 88/93  soft-tissue]
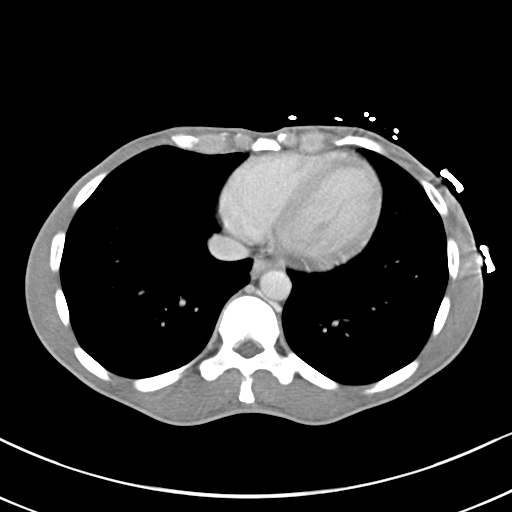

[Series 5: coronal st · coronal · 0.64mm/px · 3 of 124 slices shown]
[im 42/124  soft-tissue]
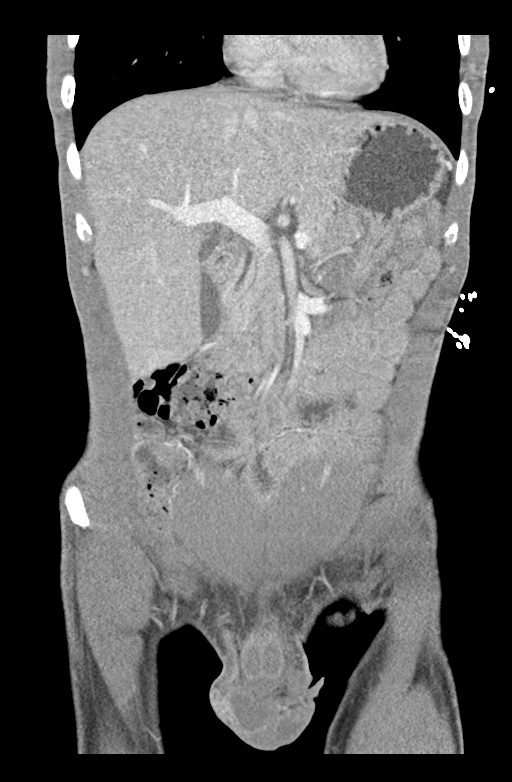
[im 55/124  soft-tissue]
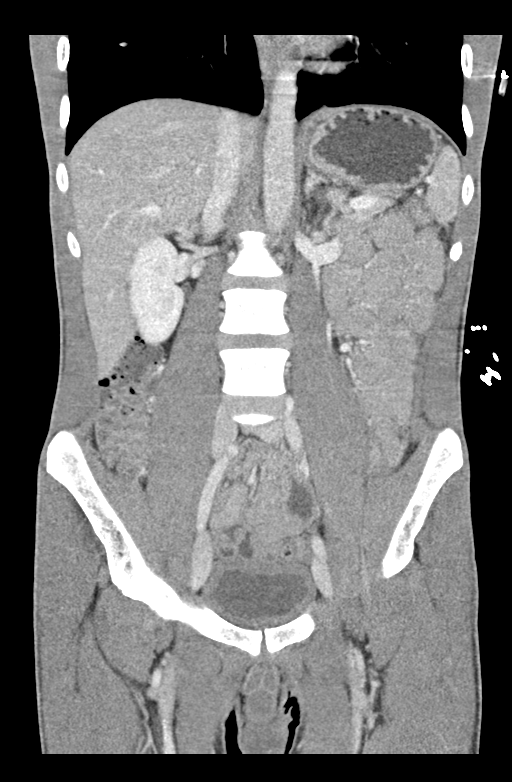
[im 69/124  soft-tissue]
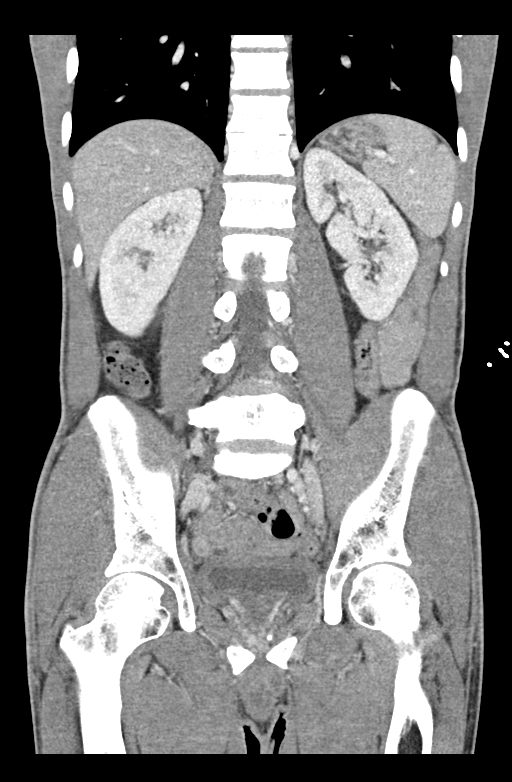

[16 of 46 positions shown; findings below may reference images not displayed]

RADIATION DOSE REDUCTION: This exam was performed according to the
departmental dose-optimization program which includes automated
exposure control, adjustment of the mA and/or kV according to
patient size and/or use of iterative reconstruction technique.

CONTRAST:  100mL OMNIPAQUE IOHEXOL 300 MG/ML  SOLN
FINDINGS: Lower chest: No acute abnormality

Hepatobiliary: No focal hepatic abnormality. Gallbladder
unremarkable.

Pancreas: No focal abnormality or ductal dilatation.

Spleen: No focal abnormality.  Normal size.

Adrenals/Urinary Tract: No adrenal abnormality. No focal renal
abnormality. No stones or hydronephrosis. Urinary bladder wall
appears thickened.

Stomach/Bowel: Stomach, large and small bowel grossly unremarkable.
Appendix not definitively seen. No pericecal inflammation.

Vascular/Lymphatic: No evidence of aneurysm or adenopathy.

Reproductive: No visible focal abnormality.

Other: No free fluid or free air.

Musculoskeletal: No acute bony abnormality.
IMPRESSION: Urinary bladder wall appears thickened. Cannot exclude cystitis.
Recommend clinical correlation.

Otherwise no acute findings.

## 2024-02-24 ENCOUNTER — Emergency Department (HOSPITAL_COMMUNITY)
Admission: EM | Admit: 2024-02-24 | Discharge: 2024-02-24 | Disposition: A | Discharge status: Home / Self Care | Attending: Emergency Medicine | Admitting: Emergency Medicine

## 2024-02-24 ENCOUNTER — Other Ambulatory Visit: Payer: Self-pay

## 2024-02-24 DIAGNOSIS — R1084 Generalized abdominal pain: Secondary | ICD-10-CM | POA: Insufficient documentation

## 2024-02-24 DIAGNOSIS — D72829 Elevated white blood cell count, unspecified: Secondary | ICD-10-CM | POA: Insufficient documentation

## 2024-02-24 DIAGNOSIS — R1116 Cannabis hyperemesis syndrome: Secondary | ICD-10-CM | POA: Insufficient documentation

## 2024-02-24 LAB — URINALYSIS, ROUTINE W REFLEX MICROSCOPIC
Bacteria, UA: NONE SEEN
Bilirubin Urine: NEGATIVE
Glucose, UA: >=500 mg/dL — AB
Hgb urine dipstick: NEGATIVE
Ketones, ur: 20 mg/dL — AB
Leukocytes,Ua: NEGATIVE
Nitrite: NEGATIVE
Protein, ur: NEGATIVE mg/dL
Specific Gravity, Urine: 1.02 (ref 1.005–1.030)
pH: 6 (ref 5.0–8.0)

## 2024-02-24 LAB — COMPREHENSIVE METABOLIC PANEL WITH GFR
ALT: 24 U/L (ref 0–44)
AST: 36 U/L (ref 15–41)
Albumin: 4.2 g/dL (ref 3.5–5.0)
Alkaline Phosphatase: 69 U/L (ref 38–126)
Anion gap: 14 (ref 5–15)
BUN: 14 mg/dL (ref 6–20)
CO2: 21 mmol/L — ABNORMAL LOW (ref 22–32)
Calcium: 9.3 mg/dL (ref 8.9–10.3)
Chloride: 108 mmol/L (ref 98–111)
Creatinine, Ser: 0.7 mg/dL (ref 0.61–1.24)
GFR, Estimated: >60 mL/min (ref >60)
Glucose, Bld: 94 mg/dL (ref 70–99)
Potassium: 4.4 mmol/L (ref 3.5–5.1)
Sodium: 144 mmol/L (ref 135–145)
Total Bilirubin: 1 mg/dL (ref 0.0–1.2)
Total Protein: 6.7 g/dL (ref 6.5–8.1)

## 2024-02-24 LAB — CBC WITH DIFFERENTIAL/PLATELET
Abs Immature Granulocytes: 0.06 K/uL (ref 0.00–0.07)
Basophils Absolute: 0 K/uL (ref 0.0–0.1)
Basophils Relative: 0 %
Eosinophils Absolute: 0 K/uL (ref 0.0–0.5)
Eosinophils Relative: 0 %
HCT: 37.9 % — ABNORMAL LOW (ref 39.0–52.0)
Hemoglobin: 12.1 g/dL — ABNORMAL LOW (ref 13.0–17.0)
Immature Granulocytes: 0 %
Lymphocytes Relative: 6 %
Lymphs Abs: 0.9 K/uL (ref 0.7–4.0)
MCH: 30.6 pg (ref 26.0–34.0)
MCHC: 31.9 g/dL (ref 30.0–36.0)
MCV: 95.9 fL (ref 80.0–100.0)
Monocytes Absolute: 0.4 K/uL (ref 0.1–1.0)
Monocytes Relative: 3 %
Neutro Abs: 12.9 K/uL — ABNORMAL HIGH (ref 1.7–7.7)
Neutrophils Relative %: 91 %
Platelets: 223 K/uL (ref 150–400)
RBC: 3.95 MIL/uL — ABNORMAL LOW (ref 4.22–5.81)
RDW: 12.2 % (ref 11.5–15.5)
WBC: 14.3 K/uL — ABNORMAL HIGH (ref 4.0–10.5)
nRBC: 0 % (ref 0.0–0.2)

## 2024-02-24 LAB — MAGNESIUM: Magnesium: 1.8 mg/dL (ref 1.7–2.4)

## 2024-02-24 MED ORDER — DIPHENHYDRAMINE HCL 50 MG/ML IJ SOLN
12.5000 mg | Freq: Once | INTRAMUSCULAR | Status: AC
  Administered 2024-02-24: 12.5 mg via INTRAVENOUS
  Filled 2024-02-24: qty 1

## 2024-02-24 MED ORDER — DROPERIDOL 2.5 MG/ML IJ SOLN
1.2500 mg | Freq: Once | INTRAMUSCULAR | Status: AC
  Administered 2024-02-24: 1.25 mg via INTRAVENOUS
  Filled 2024-02-24: qty 2

## 2024-02-24 MED ORDER — FENTANYL CITRATE (PF) 50 MCG/ML IJ SOSY
50.0000 ug | PREFILLED_SYRINGE | Freq: Once | INTRAMUSCULAR | Status: AC
  Administered 2024-02-24: 50 ug via INTRAVENOUS
  Filled 2024-02-24: qty 1

## 2024-02-24 MED ORDER — LACTATED RINGERS IV BOLUS
1000.0000 mL | Freq: Once | INTRAVENOUS | Status: AC
  Administered 2024-02-24: 1000 mL via INTRAVENOUS

## 2024-02-24 NOTE — ED Triage Notes (Signed)
 Patient BIB EMS from home with complaints of abdominal pain, N/V x 2 days. Patient states he has had N/V after smoking weed and he smoked weed yesterday. A/O x 4. Abd pain 10 on arrival of EMS, given meds en route to ED and now rates pain a 5.    Per EMS:  18g L FA- 1 liter LR 4mg  Zofran  given  150mcg Fentanyl given   VS: 136/82 95 98% RA CBG 226

## 2024-02-24 NOTE — Discharge Instructions (Signed)
 Your symptoms improved.  Cut back on marijuana use.  Return for any emergent symptoms.

## 2024-02-24 NOTE — ED Provider Notes (Signed)
 [place] [facility] DE[facility]MENT AT [facility] Provider Note   CSN: [id number] Arrival date & time: 02/24/24  1441     Patient presents with: No chief complaint on file.   Shawn Maldonado is a 38 y.o. male.   38 year old male presents today for concern of what he believes to be cannabinol hyperemesis syndrome.  Symptoms ongoing for 2 days but has significantly worsened today.  Most recently smoked weed yesterday.  Some improvement in pain with medications given and route which was fentanyl.  No history of abdominal surgeries.  The history is provided by the patient. No language interpreter was used.       Prior to Admission medications   Medication Sig Start Date End Date Taking? Authorizing Provider  Accu-Chek Softclix Lancets lancets Use to check blood sugar 3 times daily. 01/04/24   Newlin, Enobong, MD  ALPRAZolam  (XANAX ) 0.5 MG tablet Take 1 tablet (0.5 mg total) by mouth at bedtime as needed for anxiety. 12/22/23   [name], MD  Blood Glucose Monitoring Suppl (ACCU-CHEK GUIDE) w/Device KIT Use to check blood sugar 3 times daily. 01/04/24   Newlin, Enobong, MD  Continuous Glucose Receiver (DEXCOM G7 RECEIVER) DEVI Check blood sugar three times daily. E10.69 04/06/23   Delbert Clam, MD  Continuous Glucose Sensor (DEXCOM G7 SENSOR) MISC USE TO CHECK BLOOD SUGAR THREE TIMES DAILY AND CHANGE SENSORS ONCE EVERY 10 DAYS 01/01/24   Newlin, Enobong, MD  gabapentin  (NEURONTIN ) 300 MG capsule Take 1 capsule (300 mg total) by mouth at bedtime. Patient not taking: Reported on 12/22/2023 05/31/22   Newlin, Enobong, MD  glucose blood (ACCU-CHEK GUIDE TEST) test strip Use to check blood sugar 3 times daily. 01/04/24   Newlin, Enobong, MD  hydrOXYzine  (ATARAX ) 25 MG tablet Take 1 tablet (25 mg total) by mouth every 8 (eight) hours as needed for anxiety. 01/01/24   Newlin, Enobong, MD  insulin  aspart (NOVOLOG ) 100 UNIT/ML injection Inject 0-12 Units into the skin 3 (three) times  daily before meals. Per sliding scale 01/01/24   Newlin, Enobong, MD  insulin  glargine (LANTUS ) 100 UNIT/ML injection Inject 0.15 mLs (15 Units total) into the skin at bedtime. 01/01/24   Newlin, Enobong, MD  methylPREDNISolone  (MEDROL  DOSEPAK) 4 MG TBPK tablet Take per packet instructions Patient not taking: Reported on 01/01/2024 09/14/23   Overturf, Jackson L, PA-C  sildenafil  (VIAGRA ) 100 MG tablet Take 1 tablet (100 mg total) by mouth daily as needed for erectile dysfunction. At least 24 hours between doses 04/12/23   Newlin, Enobong, MD    Allergies: Patient has no known allergies.    Review of Systems  Constitutional:  Negative for chills and fever.  Gastrointestinal:  Positive for abdominal pain, nausea and vomiting.  Neurological:  Negative for light-headedness.  All other systems reviewed and are negative.   Updated Vital Signs BP (!) 161/72 (BP Location: Right Arm)   Pulse (!) 59   Temp 98.6 F (37 C) (Oral)   Resp 18   SpO2 100%   Physical Exam Vitals and nursing note reviewed.  Constitutional:      General: He is not in acute distress.    Appearance: Normal appearance. He is not ill-appearing.  HENT:     Head: Normocephalic and atraumatic.     Nose: Nose normal.  Eyes:     Conjunctiva/sclera: Conjunctivae normal.  Cardiovascular:     Rate and Rhythm: Normal rate and regular rhythm.  Pulmonary:     Effort: Pulmonary effort is  normal. No respiratory distress.  Abdominal:     General: There is no distension.     Tenderness: There is abdominal tenderness (Generalized tenderness). There is no guarding.  Musculoskeletal:        General: No deformity. Normal range of motion.     Cervical back: Normal range of motion.  Skin:    Findings: No rash.  Neurological:     Mental Status: He is alert.     (all labs ordered are listed, but only abnormal results are displayed) Labs Reviewed  CBC WITH DIFFERENTIAL/PLATELET - Abnormal; Notable for the following components:       Result Value   WBC 14.3 (*)    RBC 3.95 (*)    Hemoglobin 12.1 (*)    HCT 37.9 (*)    Neutro Abs 12.9 (*)    All other components within normal limits  COMPREHENSIVE METABOLIC PANEL WITH GFR  MAGNESIUM   URINALYSIS, ROUTINE W REFLEX MICROSCOPIC    EKG: None  Radiology: No results found.   Procedures   Medications Ordered in the ED  fentaNYL (SUBLIMAZE) injection 50 mcg (has no administration in time range)  droperidol  (INAPSINE ) 2.5 MG/ML injection 1.25 mg (1.25 mg Intravenous Given 02/24/24 1603)  diphenhydrAMINE  (BENADRYL ) injection 12.5 mg (12.5 mg Intravenous Given 02/24/24 1604)  lactated ringers  bolus 1,000 mL (1,000 mLs Intravenous New Bag/Given 02/24/24 1600)    Clinical Course as of 02/24/24 1949  Sun Feb 24, 2024  1730 Patient reevaluated.  Feels significantly improved.  Will follow-up blood work. [AA]    Clinical Course User Index [AA] Hildegard Loge, PA-C                                 Medical Decision Making Amount and/or Complexity of Data Reviewed Labs: ordered.  Risk Prescription drug management.   38 year old male presents today for concern of abdominal pain, nausea, vomiting.  Has history of cannabinoid hyperemesis syndrome.  Most recently smoked weed yesterday. Will provide him with medications and obtain labs.  Will reevaluate.  CBC with leukocytosis.  Likely reactive.  CMP without acute concern.  UA without evidence of UTI.  Ketones present. Abdominal exam is benign.  No evidence of acute abdomen.  Reevaluation his symptoms were resolved.  He was resting comfortably. Abdomen without tenderness. He has history of the same.  Discussed cutting back on his marijuana use.  Discharged in stable condition.  Patient is in agreement with plan.   Final diagnoses:  Cannabinoid hyperemesis syndrome    ED Discharge Orders     None          Hildegard Loge, PA-C 02/24/24 1950    Franklyn Sid SAILOR, MD 02/24/24 2044

## 2024-02-26 ENCOUNTER — Inpatient Hospital Stay (HOSPITAL_COMMUNITY)
Admission: EM | Admit: 2024-02-26 | Discharge: 2024-02-28 | DRG: 391 | Disposition: A | Discharge status: Home / Self Care | Attending: Internal Medicine | Admitting: Internal Medicine

## 2024-02-26 ENCOUNTER — Other Ambulatory Visit: Payer: Self-pay

## 2024-02-26 ENCOUNTER — Encounter (HOSPITAL_COMMUNITY): Payer: Self-pay | Admitting: Emergency Medicine

## 2024-02-26 ENCOUNTER — Emergency Department (HOSPITAL_COMMUNITY)

## 2024-02-26 DIAGNOSIS — Z79899 Other long term (current) drug therapy: Secondary | ICD-10-CM

## 2024-02-26 DIAGNOSIS — E1010 Type 1 diabetes mellitus with ketoacidosis without coma: Secondary | ICD-10-CM | POA: Diagnosis present

## 2024-02-26 DIAGNOSIS — E860 Dehydration: Secondary | ICD-10-CM | POA: Diagnosis present

## 2024-02-26 DIAGNOSIS — F1910 Other psychoactive substance abuse, uncomplicated: Secondary | ICD-10-CM

## 2024-02-26 DIAGNOSIS — F1210 Cannabis abuse, uncomplicated: Secondary | ICD-10-CM | POA: Diagnosis present

## 2024-02-26 DIAGNOSIS — Z91199 Patient's noncompliance with other medical treatment and regimen due to unspecified reason: Secondary | ICD-10-CM | POA: Diagnosis not present

## 2024-02-26 DIAGNOSIS — D638 Anemia in other chronic diseases classified elsewhere: Secondary | ICD-10-CM | POA: Diagnosis present

## 2024-02-26 DIAGNOSIS — E109 Type 1 diabetes mellitus without complications: Secondary | ICD-10-CM | POA: Diagnosis present

## 2024-02-26 DIAGNOSIS — Z8249 Family history of ischemic heart disease and other diseases of the circulatory system: Secondary | ICD-10-CM | POA: Diagnosis not present

## 2024-02-26 DIAGNOSIS — K3184 Gastroparesis: Secondary | ICD-10-CM | POA: Diagnosis present

## 2024-02-26 DIAGNOSIS — R1116 Cannabis hyperemesis syndrome: Secondary | ICD-10-CM | POA: Diagnosis present

## 2024-02-26 DIAGNOSIS — Z794 Long term (current) use of insulin: Secondary | ICD-10-CM | POA: Diagnosis not present

## 2024-02-26 DIAGNOSIS — R112 Nausea with vomiting, unspecified: Secondary | ICD-10-CM

## 2024-02-26 DIAGNOSIS — E119 Type 2 diabetes mellitus without complications: Secondary | ICD-10-CM

## 2024-02-26 DIAGNOSIS — E1043 Type 1 diabetes mellitus with diabetic autonomic (poly)neuropathy: Secondary | ICD-10-CM | POA: Diagnosis present

## 2024-02-26 DIAGNOSIS — E1069 Type 1 diabetes mellitus with other specified complication: Secondary | ICD-10-CM

## 2024-02-26 DIAGNOSIS — Z87891 Personal history of nicotine dependence: Secondary | ICD-10-CM

## 2024-02-26 DIAGNOSIS — E1143 Type 2 diabetes mellitus with diabetic autonomic (poly)neuropathy: Secondary | ICD-10-CM | POA: Diagnosis present

## 2024-02-26 LAB — CBC WITH DIFFERENTIAL/PLATELET
Abs Immature Granulocytes: 0.06 K/uL (ref 0.00–0.07)
Basophils Absolute: 0.1 K/uL (ref 0.0–0.1)
Basophils Relative: 1 %
Eosinophils Absolute: 0 K/uL (ref 0.0–0.5)
Eosinophils Relative: 0 %
HCT: 39.9 % (ref 39.0–52.0)
Hemoglobin: 12.9 g/dL — ABNORMAL LOW (ref 13.0–17.0)
Immature Granulocytes: 1 %
Lymphocytes Relative: 14 %
Lymphs Abs: 1.6 K/uL (ref 0.7–4.0)
MCH: 30 pg (ref 26.0–34.0)
MCHC: 32.3 g/dL (ref 30.0–36.0)
MCV: 92.8 fL (ref 80.0–100.0)
Monocytes Absolute: 0.6 K/uL (ref 0.1–1.0)
Monocytes Relative: 6 %
Neutro Abs: 8.7 K/uL — ABNORMAL HIGH (ref 1.7–7.7)
Neutrophils Relative %: 78 %
Platelets: 257 K/uL (ref 150–400)
RBC: 4.3 MIL/uL (ref 4.22–5.81)
RDW: 12.2 % (ref 11.5–15.5)
WBC: 11.1 K/uL — ABNORMAL HIGH (ref 4.0–10.5)
nRBC: 0 % (ref 0.0–0.2)

## 2024-02-26 LAB — COMPREHENSIVE METABOLIC PANEL WITH GFR
ALT: 31 U/L (ref 0–44)
AST: 44 U/L — ABNORMAL HIGH (ref 15–41)
Albumin: 4.7 g/dL (ref 3.5–5.0)
Alkaline Phosphatase: 80 U/L (ref 38–126)
Anion gap: 17 — ABNORMAL HIGH (ref 5–15)
BUN: 16 mg/dL (ref 6–20)
CO2: 20 mmol/L — ABNORMAL LOW (ref 22–32)
Calcium: 9.9 mg/dL (ref 8.9–10.3)
Chloride: 100 mmol/L (ref 98–111)
Creatinine, Ser: 0.86 mg/dL (ref 0.61–1.24)
GFR, Estimated: >60 mL/min (ref >60)
Glucose, Bld: 265 mg/dL — ABNORMAL HIGH (ref 70–99)
Potassium: 3.9 mmol/L (ref 3.5–5.1)
Sodium: 137 mmol/L (ref 135–145)
Total Bilirubin: 1.5 mg/dL — ABNORMAL HIGH (ref 0.0–1.2)
Total Protein: 7.8 g/dL (ref 6.5–8.1)

## 2024-02-26 LAB — BLOOD GAS, VENOUS
Acid-base deficit: 12.3 mmol/L — ABNORMAL HIGH (ref 0.0–2.0)
Bicarbonate: 12.3 mmol/L — ABNORMAL LOW (ref 20.0–28.0)
O2 Saturation: 95.7 %
Patient temperature: 37.3
pCO2, Ven: 25 mmHg — ABNORMAL LOW (ref 44–60)
pH, Ven: 7.3 (ref 7.25–7.43)
pO2, Ven: 69 mmHg — ABNORMAL HIGH (ref 32–45)

## 2024-02-26 LAB — CBC
HCT: 35.6 % — ABNORMAL LOW (ref 39.0–52.0)
Hemoglobin: 11.8 g/dL — ABNORMAL LOW (ref 13.0–17.0)
MCH: 31.6 pg (ref 26.0–34.0)
MCHC: 33.1 g/dL (ref 30.0–36.0)
MCV: 95.4 fL (ref 80.0–100.0)
Platelets: 265 K/uL (ref 150–400)
RBC: 3.73 MIL/uL — ABNORMAL LOW (ref 4.22–5.81)
RDW: 12.5 % (ref 11.5–15.5)
WBC: 20.2 K/uL — ABNORMAL HIGH (ref 4.0–10.5)
nRBC: 0 % (ref 0.0–0.2)

## 2024-02-26 LAB — URINALYSIS, ROUTINE W REFLEX MICROSCOPIC
Bacteria, UA: NONE SEEN
Bilirubin Urine: NEGATIVE
Glucose, UA: >=500 mg/dL — AB
Hgb urine dipstick: NEGATIVE
Ketones, ur: 20 mg/dL — AB
Leukocytes,Ua: NEGATIVE
Nitrite: NEGATIVE
Protein, ur: NEGATIVE mg/dL
Specific Gravity, Urine: 1.021 (ref 1.005–1.030)
pH: 5 (ref 5.0–8.0)

## 2024-02-26 LAB — URINE DRUG SCREEN
Amphetamines: NEGATIVE
Barbiturates: NEGATIVE
Benzodiazepines: NEGATIVE
Cocaine: NEGATIVE
Fentanyl: POSITIVE — AB
Methadone Scn, Ur: NEGATIVE
Opiates: NEGATIVE
Tetrahydrocannabinol: POSITIVE — AB

## 2024-02-26 LAB — GLUCOSE, CAPILLARY
Glucose-Capillary: 389 mg/dL — ABNORMAL HIGH (ref 70–99)
Glucose-Capillary: 389 mg/dL — ABNORMAL HIGH (ref 70–99)

## 2024-02-26 LAB — CREATININE, SERUM
Creatinine, Ser: 1.06 mg/dL (ref 0.61–1.24)
GFR, Estimated: >60 mL/min (ref >60)

## 2024-02-26 LAB — LIPASE, BLOOD: Lipase: 18 U/L (ref 11–51)

## 2024-02-26 LAB — BETA-HYDROXYBUTYRIC ACID: Beta-Hydroxybutyric Acid: 6.65 mmol/L — ABNORMAL HIGH (ref 0.05–0.27)

## 2024-02-26 MED ORDER — INSULIN ASPART 100 UNIT/ML IJ SOLN
0.0000 [IU] | Freq: Every day | INTRAMUSCULAR | Status: DC
  Administered 2024-02-26: 5 [IU] via SUBCUTANEOUS

## 2024-02-26 MED ORDER — FAMOTIDINE IN NACL 20-0.9 MG/50ML-% IV SOLN
20.0000 mg | Freq: Once | INTRAVENOUS | Status: AC
  Administered 2024-02-26: 20 mg via INTRAVENOUS
  Filled 2024-02-26: qty 50

## 2024-02-26 MED ORDER — ONDANSETRON HCL 4 MG/2ML IJ SOLN
4.0000 mg | Freq: Four times a day (QID) | INTRAMUSCULAR | Status: DC | PRN
  Administered 2024-02-27 – 2024-02-28 (×4): 4 mg via INTRAVENOUS
  Filled 2024-02-26 (×4): qty 2

## 2024-02-26 MED ORDER — DROPERIDOL 2.5 MG/ML IJ SOLN
1.2500 mg | Freq: Once | INTRAMUSCULAR | Status: AC
  Administered 2024-02-26: 1.25 mg via INTRAVENOUS
  Filled 2024-02-26: qty 2

## 2024-02-26 MED ORDER — DIPHENHYDRAMINE HCL 50 MG/ML IJ SOLN
25.0000 mg | Freq: Once | INTRAMUSCULAR | Status: AC
  Administered 2024-02-26: 25 mg via INTRAVENOUS
  Filled 2024-02-26: qty 1

## 2024-02-26 MED ORDER — FENTANYL CITRATE (PF) 50 MCG/ML IJ SOSY
50.0000 ug | PREFILLED_SYRINGE | Freq: Once | INTRAMUSCULAR | Status: AC
  Administered 2024-02-26: 50 ug via INTRAVENOUS
  Filled 2024-02-26: qty 1

## 2024-02-26 MED ORDER — INSULIN ASPART 100 UNIT/ML IJ SOLN: 0.0000 [IU] | Freq: Three times a day (TID) | INTRAMUSCULAR | Status: DC

## 2024-02-26 MED ORDER — ENOXAPARIN SODIUM 40 MG/0.4ML IJ SOSY
40.0000 mg | PREFILLED_SYRINGE | INTRAMUSCULAR | Status: DC
  Administered 2024-02-26 – 2024-02-27 (×2): 40 mg via SUBCUTANEOUS
  Filled 2024-02-26 (×2): qty 0.4

## 2024-02-26 MED ORDER — SODIUM CHLORIDE 0.9 % IV BOLUS
1000.0000 mL | Freq: Once | INTRAVENOUS | Status: AC
  Administered 2024-02-26: 1000 mL via INTRAVENOUS

## 2024-02-26 MED ORDER — ONDANSETRON HCL 4 MG/2ML IJ SOLN
4.0000 mg | Freq: Once | INTRAMUSCULAR | Status: AC
  Administered 2024-02-26: 4 mg via INTRAVENOUS
  Filled 2024-02-26: qty 2

## 2024-02-26 MED ORDER — ONDANSETRON HCL 4 MG PO TABS: 4.0000 mg | ORAL_TABLET | ORAL | 0 refills | Status: AC | PRN

## 2024-02-26 MED ORDER — DROPERIDOL 2.5 MG/ML IJ SOLN
2.5000 mg | Freq: Once | INTRAMUSCULAR | Status: AC
  Administered 2024-02-26: 2.5 mg via INTRAVENOUS
  Filled 2024-02-26: qty 2

## 2024-02-26 MED ORDER — LACTATED RINGERS IV SOLN: INTRAVENOUS | Status: AC

## 2024-02-26 MED ORDER — LORAZEPAM 1 MG PO TABS
2.0000 mg | ORAL_TABLET | Freq: Once | ORAL | Status: AC
  Administered 2024-02-26: 2 mg via ORAL
  Filled 2024-02-26: qty 2

## 2024-02-26 MED ORDER — ONDANSETRON HCL 4 MG PO TABS: 4.0000 mg | ORAL_TABLET | Freq: Four times a day (QID) | ORAL | Status: DC | PRN

## 2024-02-26 MED ORDER — IOHEXOL 350 MG/ML SOLN
100.0000 mL | Freq: Once | INTRAVENOUS | Status: AC | PRN
  Administered 2024-02-26: 100 mL via INTRAVENOUS

## 2024-02-26 NOTE — H&P (Signed)
 History and Physical    Patient: Shawn Maldonado FMW:[id number] DOB: [birth year] DOA: 02/26/2024 DOS: the patient was seen and examined on 02/26/2024 PCP: Delbert Clam, MD  Patient coming from: Home  Chief Complaint:  Chief Complaint  Patient presents with   Abdominal Pain   HPI: Shawn Maldonado is a 38 y.o. male with medical history significant of type 1 diabetes, chronic cannabis abuse, who has been using cannabis and subs substitute for pain medication apparently due to his neuropathy of the right leg.  He has been having intractable nausea with vomiting.  No diarrhea.  No fever or chills.  Patient came to the ER with wife and unable to keep anything down.  Was seen evaluated and continue to have nausea with vomiting.  He also has remote history of gastroparesis.  His urine drug screen is positive for THC.  At this point patient appears to have hyperemesis cannabinoid and is being admitted to the hospital for symptomatic management.  Review of Systems: As mentioned in the history of present illness. All other systems reviewed and are negative. Past Medical History:  Diagnosis Date   Diabetes mellitus without complication (HCC)    Past Surgical History:  Procedure Laterality Date   MOUTH SURGERY     Social History:  reports that he has quit smoking. His smoking use included cigarettes. He has never used smokeless tobacco. He reports current alcohol use. He reports current drug use. Drug: Marijuana.  No Known Allergies  Family History  Problem Relation Age of Onset   Stroke Father    CAD Other     Prior to Admission medications   Medication Sig Start Date End Date Taking? Authorizing Provider  ondansetron  (ZOFRAN ) 4 MG tablet Take 1 tablet (4 mg total) by mouth every 4 (four) hours as needed for nausea or vomiting. 02/26/24  Yes Elnor Savant A, DO  Accu-Chek Softclix Lancets lancets Use to check blood sugar 3 times daily. 01/04/24   Newlin, Enobong, MD  ALPRAZolam   (XANAX ) 0.5 MG tablet Take 1 tablet (0.5 mg total) by mouth at bedtime as needed for anxiety. 12/22/23   [name], MD  Blood Glucose Monitoring Suppl (ACCU-CHEK GUIDE) w/Device KIT Use to check blood sugar 3 times daily. 01/04/24   Newlin, Enobong, MD  Continuous Glucose Receiver (DEXCOM G7 RECEIVER) DEVI Check blood sugar three times daily. E10.69 04/06/23   Delbert Clam, MD  Continuous Glucose Sensor (DEXCOM G7 SENSOR) MISC USE TO CHECK BLOOD SUGAR THREE TIMES DAILY AND CHANGE SENSORS ONCE EVERY 10 DAYS 01/01/24   Newlin, Enobong, MD  gabapentin  (NEURONTIN ) 300 MG capsule Take 1 capsule (300 mg total) by mouth at bedtime. Patient not taking: Reported on 12/22/2023 05/31/22   Newlin, Enobong, MD  glucose blood (ACCU-CHEK GUIDE TEST) test strip Use to check blood sugar 3 times daily. 01/04/24   Newlin, Enobong, MD  hydrOXYzine  (ATARAX ) 25 MG tablet Take 1 tablet (25 mg total) by mouth every 8 (eight) hours as needed for anxiety. 01/01/24   Newlin, Enobong, MD  insulin  aspart (NOVOLOG ) 100 UNIT/ML injection Inject 0-12 Units into the skin 3 (three) times daily before meals. Per sliding scale 01/01/24   Newlin, Enobong, MD  insulin  glargine (LANTUS ) 100 UNIT/ML injection Inject 0.15 mLs (15 Units total) into the skin at bedtime. 01/01/24   Newlin, Enobong, MD  methylPREDNISolone  (MEDROL  DOSEPAK) 4 MG TBPK tablet Take per packet instructions Patient not taking: Reported on 01/01/2024 09/14/23   Overturf, Jackson L, PA-C  sildenafil  (VIAGRA )  100 MG tablet Take 1 tablet (100 mg total) by mouth daily as needed for erectile dysfunction. At least 24 hours between doses 04/12/23   Newlin, Enobong, MD    Physical Exam: Vitals:   02/26/24 1730 02/26/24 1745 02/26/24 1800 02/26/24 1815  BP: (!) 118/55 (!) 135/56 (!) 103/59 (!) 108/55  Pulse:    (!) 124  Resp:    16  Temp:      TempSrc:      SpO2:    100%   Constitutional: Acutely ill looking, NAD, calm, comfortable Eyes: PERRL, lids and conjunctivae  normal ENMT: Mucous membranes are dry. Posterior pharynx clear of any exudate or lesions.Normal dentition.  Neck: normal, supple, no masses, no thyromegaly Respiratory: clear to auscultation bilaterally, no wheezing, no crackles. Normal respiratory effort. No accessory muscle use.  Cardiovascular: Regular rate and rhythm, no murmurs / rubs / gallops. No extremity edema. 2+ pedal pulses. No carotid bruits.  Abdomen: no tenderness, no masses palpated. No hepatosplenomegaly. Bowel sounds positive.  Musculoskeletal: Good range of motion, no joint swelling or tenderness, Skin: no rashes, lesions, ulcers. No induration Neurologic: CN 2-12 grossly intact. Sensation intact, DTR normal. Strength 5/5 in all 4.  Psychiatric: Normal judgment and insight. Alert and oriented x 33. Normal mood  Data Reviewed:  Temperature 99.1, blood pressure 150/80, pulse 133 respiratory 33.  CO2 20 glucose 265.  His gap of 17 AST 44, CT abdomen pelvis and CT angiogram of the chest showed no acute findings but there is mild right hydronephrosis.  Assessment and Plan:  #1 Hyperemesis cannabinoid: Patient reports taking excessive amount of THC.  Appears to have uncontrolled nausea with vomiting.  Patient will continue with supportive care IV fluids.  Counseling provided.  #2 leukocytosis: Continue monitoring.  Secondary dehydration.  #3 anemia: Monitor H&H.  Continue to monitor  #4 insulin -dependent diabetes: Patient may have early DKA.  Has a gap and bicarb is low but more than likely secondary to his nausea with vomiting.  Will just initiate sliding scale insulin  and IV fluids and monitor.  #5 dehydration: Continue aggressive hydration.    Advance Care Planning:   Code Status: Full Code   Consults: None  Family Communication: Wife at bedside  Severity of Illness: The appropriate patient status for this patient is OBSERVATION. Observation status is judged to be reasonable and necessary in order to provide the  required intensity of service to ensure the patient's safety. The patient's presenting symptoms, physical exam findings, and initial radiographic and laboratory data in the context of their medical condition is felt to place them at decreased risk for further clinical deterioration. Furthermore, it is anticipated that the patient will be medically stable for discharge from the hospital within 2 midnights of admission.   AuthorBETHA SIM KNOLL, MD 02/26/2024 7:11 PM  For on call review [email or web address].

## 2024-02-26 NOTE — ED Notes (Signed)
 All of the vomiting and nausea have resolved. Reported some side pain, medication was given for same. He seems to be resting at this time.

## 2024-02-26 NOTE — Discharge Instructions (Addendum)
 It was a pleasure caring for you today in the emergency department.  Please stop using THC products  Please return to the emergency department for any worsening or worrisome symptoms.     RESOURCE GUIDE  Chronic Pain Problems: Contact [facility]  [phone] Patients need to be referred by their primary care doctor.  Insufficient Money for Medicine: Contact [employer]:  call [phone]  No Primary Care Doctor: Call [facility]  [phone] - can help you locate a primary care doctor that  accepts your insurance, provides certain services, etc. Physician Referral Service- [phone]  Agencies that provide inexpensive medical care: [facility]  [phone] [facility]  [phone] [facility]  [phone] [facility]  [phone] [facility]  [phone] [facility]  [phone]  Medicaid-accepting [facility] Providers: [facility]- [address], [address]  [phone], Mon-Fri 9am-7pm, Sat 9am-1pm [facility]- [address] [place], [address] [place]  [phone] [facility]- [address], [address] [place]  [phone] [facility]- [address]  [phone] [facility]- [address] [place], [address], [phone]  Only accepts [place] patients after they have their name  applied to their card  Self Pay (no insurance) in [facility]: Sickle Cell Patients - [facility] Internal Medicine  [address] [place], [phone] [facility]- [address] [place]  [phone]       [place] [facility] [place]- [address], [address]       -     [facility]- see information above (Speak to [employer] if you do not have insurance)       -  [facility]- [address],  [phone]       -  [facility]- [address], [phone]       -  [facility]-  [address], [place], [phone]       -  [facility] - [address], [phone]       -  [facility]- [address] [phone], also [address], [phone]       -     [facility]- [address] [place], [phone], 1st & 3rd Saturday         every month, 10am-1pm  -     [facility]   [address] [place], [place].   Phone:  [phone], Fax:  [phone]. Hours of Operation:  9 am - 6 pm, M-F.  -     [facility]   [address], [place]   Phone: [phone], Fax: [phone]. Hours of Operation:  8:30 am - 5:30 pm, M-F.    Dental Assistance If unable to pay or uninsured, contact:  [facility]. to become qualified for the adult dental clinic.  Patients with Medicaid: [facility] [phone] [address], [phone] [address] [phone]  If unable to pay, or uninsured, contact [facility] [phone] in [place], [phone] in [facility]) to become qualified for the adult dental clinic  [facility] [address] [place], [place] [address] [phone] [email or web address]  Other [facility] [place]- [address] [place], [place]  [place], [place], [address], [phone], Ext. [phone], 2nd and 4th Thursday of the month at 6:30am.  10 clients each day by appointment, can sometimes see walk-in patients if someone does not show for an appointment. [facility]- [address] [place], [place], [address], [phone] [facility] [address] [place], [place], [address], [phone] [facility] Health Department- [phone] [facility] Health Department- [phone] [facility] [phone]

## 2024-02-26 NOTE — ED Notes (Signed)
 Patient transported to CT

## 2024-02-26 NOTE — ED Provider Notes (Signed)
 [place] EMERGENCY DEPARTMENT AT [facility] Provider Note  CSN: [id number] Arrival date & time: 02/26/24 9245  Chief Complaint(s) Abdominal Pain  HPI Shawn Maldonado is a 38 y.o. male with past medical history as below, significant for DM CHS, marijuana use, peripheral neuropathy who presents to the ED with complaint of abd pain nausea vomiting  History of CHS, patient reports that he smoked marijuana yesterday and it provoked his symptoms.  Patient reports his symptoms are characteristic of his typical CHS exacerbations.  He is requesting medication to help with the pain.  Reports that he felt okay prior to smoking marijuana.  No change in bowel or bladder function.  No chest pain or dyspnea.  Abdominal pain is sharp and stabbing, he is dry heaving.  Past Medical History Past Medical History:  Diagnosis Date   Diabetes mellitus without complication [facility])    Patient Active Problem List   Diagnosis Date Noted   Epigastric pain 10/29/2022   Dehydration 10/29/2022   GERD (gastroesophageal reflux disease) 10/29/2022   Cannabinoid hyperemesis syndrome 10/29/2022   SIRS (systemic inflammatory response syndrome) (HCC) 09/29/2022   Intractable nausea and vomiting 01/19/2020   Elevated bilirubin 02/14/2019   DKA (diabetic ketoacidosis) (HCC) 07/27/2018   Diabetic gastroparesis (HCC) 05/21/2017   Leukocytosis 05/21/2017   Transaminitis 05/21/2017   Nausea & vomiting 05/17/2017   AKI (acute kidney injury) 05/17/2017   Elevated LFTs 05/17/2017   DKA, type 1 (HCC) 05/16/2017   Hyperbilirubinemia 08/17/2015   Intractable vomiting    Type 1 diabetes mellitus with ketoacidosis without coma (HCC)    Type I diabetes mellitus (HCC) 08/14/2015   Peripheral neuropathy 08/14/2015   Diabetic ketoacidosis without coma associated with type 1 diabetes mellitus (HCC)    Acute upper respiratory infection 04/30/2015   Hypokalemia 09/25/2012   Diabetes mellitus (HCC) 09/24/2012    DKA (diabetic ketoacidosis) (HCC) 09/24/2012   Oral thrush 09/24/2012   Home Medication(s) Prior to Admission medications   Medication Sig Start Date End Date Taking? Authorizing Provider  Accu-Chek Softclix Lancets lancets Use to check blood sugar 3 times daily. 01/04/24   Newlin, Enobong, MD  ALPRAZolam  (XANAX ) 0.5 MG tablet Take 1 tablet (0.5 mg total) by mouth at bedtime as needed for anxiety. 12/22/23   [name], MD  Blood Glucose Monitoring Suppl (ACCU-CHEK GUIDE) w/Device KIT Use to check blood sugar 3 times daily. 01/04/24   Newlin, Enobong, MD  Continuous Glucose Receiver (DEXCOM G7 RECEIVER) DEVI Check blood sugar three times daily. E10.69 04/06/23   Delbert Clam, MD  Continuous Glucose Sensor (DEXCOM G7 SENSOR) MISC USE TO CHECK BLOOD SUGAR THREE TIMES DAILY AND CHANGE SENSORS ONCE EVERY 10 DAYS 01/01/24   Newlin, Enobong, MD  gabapentin  (NEURONTIN ) 300 MG capsule Take 1 capsule (300 mg total) by mouth at bedtime. Patient not taking: Reported on 12/22/2023 05/31/22   Newlin, Enobong, MD  glucose blood (ACCU-CHEK GUIDE TEST) test strip Use to check blood sugar 3 times daily. 01/04/24   Newlin, Enobong, MD  hydrOXYzine  (ATARAX ) 25 MG tablet Take 1 tablet (25 mg total) by mouth every 8 (eight) hours as needed for anxiety. 01/01/24   Newlin, Enobong, MD  insulin  aspart (NOVOLOG ) 100 UNIT/ML injection Inject 0-12 Units into the skin 3 (three) times daily before meals. Per sliding scale 01/01/24   Newlin, Enobong, MD  insulin  glargine (LANTUS ) 100 UNIT/ML injection Inject 0.15 mLs (15 Units total) into the skin at bedtime. 01/01/24   Newlin, Enobong, MD  methylPREDNISolone  (MEDROL   DOSEPAK) 4 MG TBPK tablet Take per packet instructions Patient not taking: Reported on 01/01/2024 09/14/23   Overturf, Jackson L, PA-C  sildenafil  (VIAGRA ) 100 MG tablet Take 1 tablet (100 mg total) by mouth daily as needed for erectile dysfunction. At least 24 hours between doses 04/12/23   Newlin, Enobong, MD                                                                                                                                     Past Surgical History Past Surgical History:  Procedure Laterality Date   MOUTH SURGERY     Family History Family History  Problem Relation Age of Onset   Stroke Father    CAD Other     Social History Social History   Tobacco Use   Smoking status: Former    Types: Cigarettes   Smokeless tobacco: Never  Vaping Use   Vaping status: Never Used  Substance Use Topics   Alcohol use: Yes    Comment: social   Drug use: Yes    Types: Marijuana   Allergies Patient has no known allergies.  Review of Systems A thorough review of systems was obtained and all systems are negative except as noted in the HPI and PMH.   Physical Exam Vital Signs  I have reviewed the triage vital signs BP (!) 158/80 (BP Location: Right Arm)   Pulse 97   Temp 98.8 F (37.1 C) (Oral)   Resp 18   SpO2 98%  Physical Exam Vitals and nursing note reviewed.  Constitutional:      General: He is not in acute distress.    Appearance: Normal appearance. He is well-developed. He is not ill-appearing.  HENT:     Head: Normocephalic and atraumatic.     Right Ear: External ear normal.     Left Ear: External ear normal.     Nose: Nose normal.     Mouth/Throat:     Mouth: Mucous membranes are moist.  Eyes:     General: No scleral icterus.       Right eye: No discharge.        Left eye: No discharge.  Cardiovascular:     Rate and Rhythm: Normal rate.  Pulmonary:     Effort: Pulmonary effort is normal. No respiratory distress.     Breath sounds: No stridor.  Abdominal:     General: Abdomen is flat. There is no distension.     Tenderness: There is generalized abdominal tenderness. There is no guarding.  Musculoskeletal:        General: No deformity.     Cervical back: No rigidity.  Skin:    General: Skin is warm and dry.     Coloration: Skin is not cyanotic, jaundiced or pale.   Neurological:     Mental Status: He is alert.  Psychiatric:        Speech: Speech normal.  Behavior: Behavior normal. Behavior is cooperative.     ED Results and Treatments Labs (all labs ordered are listed, but only abnormal results are displayed) Labs Reviewed  CBC WITH DIFFERENTIAL/PLATELET  COMPREHENSIVE METABOLIC PANEL WITH GFR  LIPASE, BLOOD  URINALYSIS, ROUTINE W REFLEX MICROSCOPIC  URINE DRUG SCREEN                                                                                                                          Radiology No results found.  Pertinent labs & imaging results that were available during my care of the patient were reviewed by me and considered in my medical decision making (see MDM for details).  Medications Ordered in ED Medications  droperidol  (INAPSINE ) 2.5 MG/ML injection 2.5 mg (has no administration in time range)  diphenhydrAMINE  (BENADRYL ) injection 25 mg (has no administration in time range)  sodium chloride  0.9 % bolus 1,000 mL (has no administration in time range)  famotidine  (PEPCID ) IVPB 20 mg premix (has no administration in time range)                                                                                                                                     Procedures Procedures  (including critical care time)  Medical Decision Making / ED Course    Medical Decision Making:    CODY ALBUS is a 38 y.o. male with past medical history as below, significant for DM CHS, marijuana use, peripheral neuropathy who presents to the ED with complaint of abd pain nausea vomiting. The complaint involves an extensive differential diagnosis and also carries with it a high risk of complications and morbidity.  Serious etiology was considered. Ddx includes but is not limited to: Differential diagnosis includes but is not exclusive to acute cholecystitis, intrathoracic causes for epigastric abdominal pain, gastritis,  duodenitis, pancreatitis, small bowel or large bowel obstruction, abdominal aortic aneurysm, hernia, gastritis, etc.   Complete initial physical exam performed, notably the patient was in nad.    Reviewed and confirmed nursing documentation for past medical history, family history, social history.  Vital signs reviewed.    CHS Nausea and vomiting abdominal pain> - History of CHS, patient recently smoked marijuana and provoked his symptoms.  Prior EKG reviewed.  Start droperidol , fluids, Benadryl .  Check screening labs        ***  Additional history obtained: -Additional history obtained from {wsadditionalhistorian:28072} -External records from outside source obtained and reviewed including: Chart review including previous notes, labs, imaging, consultation notes including  ***   Lab Tests: -I ordered, reviewed, and interpreted labs.   The pertinent results include:   Labs Reviewed  CBC WITH DIFFERENTIAL/PLATELET  COMPREHENSIVE METABOLIC PANEL WITH GFR  LIPASE, BLOOD  URINALYSIS, ROUTINE W REFLEX MICROSCOPIC  URINE DRUG SCREEN    Notable for ***  EKG   EKG Interpretation Date/Time:    Ventricular Rate:    PR Interval:    QRS Duration:    QT Interval:    QTC Calculation:   R Axis:      Text Interpretation:           Imaging Studies ordered: I ordered imaging studies including *** I independently visualized the following imaging with scope of interpretation limited to determining acute life threatening conditions related to emergency care; findings noted above I agree with the radiologist interpretation If any imaging was obtained with contrast I closely monitored patient for any possible adverse reaction a/w contrast administration in the emergency department   Medicines ordered and prescription drug management: Meds ordered this encounter  Medications   droperidol  (INAPSINE ) 2.5 MG/ML injection 2.5 mg   diphenhydrAMINE   (BENADRYL ) injection 25 mg   sodium chloride  0.9 % bolus 1,000 mL   famotidine  (PEPCID ) IVPB 20 mg premix    -I have reviewed the patients home medicines and have made adjustments as needed   Consultations Obtained: I requested consultation with the ***,  and discussed lab and imaging findings as well as pertinent plan - they recommend: ***   Cardiac Monitoring: The patient was maintained on a cardiac monitor.  I personally viewed and interpreted the cardiac monitored which showed an underlying rhythm of: *** Continuous pulse oximetry interpreted by myself, ***% on ***.    Social Determinants of Health:  Diagnosis or treatment significantly limited by social determinants of health: {wssoc:28071}   Reevaluation: After the interventions noted above, I reevaluated the patient and found that they have {resolved/improved/worsened:23923::improved}  Co morbidities that complicate the patient evaluation  Past Medical History:  Diagnosis Date   Diabetes mellitus without complication (HCC)       Dispostion: Disposition decision including need for hospitalization was considered, and patient {wsdispo:28070::discharged from emergency department.}    Final Clinical Impression(s) / ED Diagnoses Final diagnoses:  None

## 2024-02-26 NOTE — ED Triage Notes (Signed)
 Pt reports abdominal pain and vomiting. PT reports smoking marijuana yesterday and reports of history of same when smoking.

## 2024-02-26 NOTE — ED Provider Notes (Signed)
 Pt signed out by Dr. Elnor pending CT scans and symptomatic improvement.  CT scans reviewed by me.  I agree with the radiologist.  CT chest/abd/pelvis:   No CT evidence of pulmonary embolism.  2. Underdistention of the colon versus colitis. No bowel  obstruction.  3. Mild right hydronephrosis.  4. Anasarca.   Pt given additional nausea meds because he threw up all over the floor.  He seemed to be ok and then threw up again.  Pt has been here for several hours and has failed multiple nausea meds.  I suspect n/v is from hyperemesis, but I have added on DKA labs to make sure he's not developing DKA since arrival here.  Pt d/w Dr. Sim for admission.      Dean Clarity, MD 02/26/24 [phone]

## 2024-02-27 ENCOUNTER — Other Ambulatory Visit (HOSPITAL_COMMUNITY): Payer: Self-pay

## 2024-02-27 ENCOUNTER — Telehealth (HOSPITAL_COMMUNITY): Payer: Self-pay

## 2024-02-27 DIAGNOSIS — F1210 Cannabis abuse, uncomplicated: Secondary | ICD-10-CM | POA: Diagnosis not present

## 2024-02-27 DIAGNOSIS — R1116 Cannabis hyperemesis syndrome: Secondary | ICD-10-CM | POA: Diagnosis not present

## 2024-02-27 LAB — GLUCOSE, CAPILLARY
Glucose-Capillary: 113 mg/dL — ABNORMAL HIGH (ref 70–99)
Glucose-Capillary: 114 mg/dL — ABNORMAL HIGH (ref 70–99)
Glucose-Capillary: 126 mg/dL — ABNORMAL HIGH (ref 70–99)
Glucose-Capillary: 127 mg/dL — ABNORMAL HIGH (ref 70–99)
Glucose-Capillary: 131 mg/dL — ABNORMAL HIGH (ref 70–99)
Glucose-Capillary: 135 mg/dL — ABNORMAL HIGH (ref 70–99)
Glucose-Capillary: 141 mg/dL — ABNORMAL HIGH (ref 70–99)
Glucose-Capillary: 185 mg/dL — ABNORMAL HIGH (ref 70–99)
Glucose-Capillary: 201 mg/dL — ABNORMAL HIGH (ref 70–99)
Glucose-Capillary: 265 mg/dL — ABNORMAL HIGH (ref 70–99)
Glucose-Capillary: 319 mg/dL — ABNORMAL HIGH (ref 70–99)
Glucose-Capillary: 340 mg/dL — ABNORMAL HIGH (ref 70–99)
Glucose-Capillary: 472 mg/dL — ABNORMAL HIGH (ref 70–99)
Glucose-Capillary: 99 mg/dL (ref 70–99)

## 2024-02-27 LAB — COMPREHENSIVE METABOLIC PANEL WITH GFR
ALT: 26 U/L (ref 0–44)
AST: 27 U/L (ref 15–41)
Albumin: 4.1 g/dL (ref 3.5–5.0)
Alkaline Phosphatase: 80 U/L (ref 38–126)
Anion gap: 25 — ABNORMAL HIGH (ref 5–15)
BUN: 21 mg/dL — ABNORMAL HIGH (ref 6–20)
CO2: 10 mmol/L — ABNORMAL LOW (ref 22–32)
Calcium: 9.1 mg/dL (ref 8.9–10.3)
Chloride: 100 mmol/L (ref 98–111)
Creatinine, Ser: 1.06 mg/dL (ref 0.61–1.24)
GFR, Estimated: >60 mL/min (ref >60)
Glucose, Bld: 488 mg/dL — ABNORMAL HIGH (ref 70–99)
Potassium: 5 mmol/L (ref 3.5–5.1)
Sodium: 135 mmol/L (ref 135–145)
Total Bilirubin: 1.4 mg/dL — ABNORMAL HIGH (ref 0.0–1.2)
Total Protein: 6.9 g/dL (ref 6.5–8.1)

## 2024-02-27 LAB — BASIC METABOLIC PANEL WITH GFR
Anion gap: 9 (ref 5–15)
BUN: 16 mg/dL (ref 6–20)
CO2: 23 mmol/L (ref 22–32)
Calcium: 8.8 mg/dL — ABNORMAL LOW (ref 8.9–10.3)
Chloride: 108 mmol/L (ref 98–111)
Creatinine, Ser: 0.86 mg/dL (ref 0.61–1.24)
GFR, Estimated: >60 mL/min (ref >60)
Glucose, Bld: 126 mg/dL — ABNORMAL HIGH (ref 70–99)
Potassium: 4 mmol/L (ref 3.5–5.1)
Sodium: 140 mmol/L (ref 135–145)

## 2024-02-27 LAB — BETA-HYDROXYBUTYRIC ACID: Beta-Hydroxybutyric Acid: 7.38 mmol/L — ABNORMAL HIGH (ref 0.05–0.27)

## 2024-02-27 LAB — CBC
HCT: 37.5 % — ABNORMAL LOW (ref 39.0–52.0)
Hemoglobin: 11.5 g/dL — ABNORMAL LOW (ref 13.0–17.0)
MCH: 30.3 pg (ref 26.0–34.0)
MCHC: 30.7 g/dL (ref 30.0–36.0)
MCV: 98.9 fL (ref 80.0–100.0)
Platelets: 258 K/uL (ref 150–400)
RBC: 3.79 MIL/uL — ABNORMAL LOW (ref 4.22–5.81)
RDW: 12.5 % (ref 11.5–15.5)
WBC: 18.6 K/uL — ABNORMAL HIGH (ref 4.0–10.5)
nRBC: 0 % (ref 0.0–0.2)

## 2024-02-27 LAB — HIV ANTIBODY (ROUTINE TESTING W REFLEX): HIV Screen 4th Generation wRfx: NONREACTIVE

## 2024-02-27 LAB — HEMOGLOBIN A1C
Hgb A1c MFr Bld: 9 % — ABNORMAL HIGH (ref 4.8–5.6)
Mean Plasma Glucose: 211.6 mg/dL

## 2024-02-27 LAB — MRSA NEXT GEN BY PCR, NASAL: MRSA by PCR Next Gen: NOT DETECTED

## 2024-02-27 MED ORDER — PROCHLORPERAZINE EDISYLATE 10 MG/2ML IJ SOLN
5.0000 mg | Freq: Once | INTRAMUSCULAR | Status: AC | PRN
  Administered 2024-02-27: 5 mg via INTRAVENOUS
  Filled 2024-02-27: qty 2

## 2024-02-27 MED ORDER — INSULIN REGULAR(HUMAN) IN NACL 100-0.9 UT/100ML-% IV SOLN
INTRAVENOUS | Status: DC
  Administered 2024-02-27: 4.4 [IU]/h via INTRAVENOUS
  Filled 2024-02-27 (×2): qty 100

## 2024-02-27 MED ORDER — INSULIN ASPART 100 UNIT/ML IJ SOLN: 0.0000 [IU] | Freq: Every day | INTRAMUSCULAR | Status: DC

## 2024-02-27 MED ORDER — INSULIN ASPART 100 UNIT/ML IJ SOLN
18.0000 [IU] | Freq: Once | INTRAMUSCULAR | Status: AC
  Administered 2024-02-27: 18 [IU] via SUBCUTANEOUS

## 2024-02-27 MED ORDER — LACTATED RINGERS IV BOLUS
20.0000 mL/kg | Freq: Once | INTRAVENOUS | Status: AC
  Administered 2024-02-27: 1326 mL via INTRAVENOUS

## 2024-02-27 MED ORDER — INSULIN ASPART 100 UNIT/ML IJ SOLN
0.0000 [IU] | Freq: Three times a day (TID) | INTRAMUSCULAR | Status: DC
  Administered 2024-02-28: 11 [IU] via SUBCUTANEOUS

## 2024-02-27 MED ORDER — CHLORHEXIDINE GLUCONATE CLOTH 2 % EX PADS
6.0000 | MEDICATED_PAD | Freq: Every day | CUTANEOUS | Status: DC
  Administered 2024-02-27: 6 via TOPICAL

## 2024-02-27 MED ORDER — MELATONIN 5 MG PO TABS
5.0000 mg | ORAL_TABLET | Freq: Once | ORAL | Status: AC
  Administered 2024-02-27: 5 mg via ORAL
  Filled 2024-02-27: qty 1

## 2024-02-27 MED ORDER — DEXTROSE IN LACTATED RINGERS 5 % IV SOLN: INTRAVENOUS | Status: AC

## 2024-02-27 MED ORDER — DEXTROSE 50 % IV SOLN: 0.0000 mL | INTRAVENOUS | Status: DC | PRN

## 2024-02-27 MED ORDER — MELATONIN 5 MG PO TABS
5.0000 mg | ORAL_TABLET | Freq: Every evening | ORAL | Status: DC | PRN
  Administered 2024-02-27: 5 mg via ORAL
  Filled 2024-02-27: qty 1

## 2024-02-27 MED ORDER — SODIUM CHLORIDE 0.9 % IV SOLN
12.5000 mg | Freq: Once | INTRAVENOUS | Status: AC
  Administered 2024-02-27: 12.5 mg via INTRAVENOUS
  Filled 2024-02-27: qty 0.5

## 2024-02-27 MED ORDER — LACTATED RINGERS IV SOLN: INTRAVENOUS | Status: DC

## 2024-02-27 MED ORDER — INSULIN ASPART 100 UNIT/ML IJ SOLN
10.0000 [IU] | Freq: Once | INTRAMUSCULAR | Status: AC
  Administered 2024-02-27: 10 [IU] via SUBCUTANEOUS

## 2024-02-27 MED ORDER — POTASSIUM CHLORIDE 10 MEQ/100ML IV SOLN
10.0000 meq | INTRAVENOUS | Status: AC
  Administered 2024-02-27 (×2): 10 meq via INTRAVENOUS
  Filled 2024-02-27 (×2): qty 100

## 2024-02-27 MED ORDER — INSULIN GLARGINE-YFGN 100 UNIT/ML ~~LOC~~ SOLN
12.0000 [IU] | Freq: Every day | SUBCUTANEOUS | Status: DC
  Administered 2024-02-27: 12 [IU] via SUBCUTANEOUS
  Filled 2024-02-27 (×2): qty 0.12

## 2024-02-27 NOTE — Inpatient Diabetes Management (Addendum)
 Inpatient Diabetes Program Recommendations  AACE/ADA: New Consensus Statement on Inpatient Glycemic Control (2015)  Target Ranges:  Prepandial:   less than 140 mg/dL      Peak postprandial:   less than 180 mg/dL (1-2 hours)      Critically ill patients:  140 - 180 mg/dL   Lab Results  Component Value Date   GLUCAP 201 (H) 02/27/2024   HGBA1C 9.0 (H) 02/26/2024    Review of Glycemic Control  Latest Reference Range & Units 02/27/24 06:14 02/27/24 07:53 02/27/24 08:48  Glucose-Capillary 70 - 99 mg/dL 680 (H) 734 (H) 798 (H)  (H): Data is abnormally high Diabetes history: Type 1 DM Outpatient Diabetes medications: Lantus  15 units at bedtime, Novolog  0-13 units TID Current orders for Inpatient glycemic control: Novolog  10 units x 1, Novolog  18 units x 1 transitioned to IV insulin   Inpatient Diabetes Program Recommendations:    In agreement with current plan.  Continue IV insulin .  Consider adding orders for beta hydroxybutyric acid. When ready to transition consider Semglee  12 units two hours prior to discontinuation, then every day to follow, Novolog  0-6 units Q4H until diet advances.  Addendum: Spoke with patient regarding outpatient diabetes management. Denies missing doses, however, has been without CGM.  Reviewed patient's current A1c of 9.7%. Explained what a A1c is and what it measures. Also reviewed goal A1c with patient, importance of good glucose control @ home, and blood sugar goals. Reviewed patho of DM, need for improved control, impact of THC, vascular changes, and commorbidities. Patient usually wears a Dexcom but has recently been without due to missed prescription. Secure chat sent to pharmacy team to determine if potential issues with PA. Follows with [facility]. Has another appointment coming up.    Thanks, Tinnie Minus, MSN, RNC-OB Diabetes Coordinator [phone] ([phone]-5p)

## 2024-02-27 NOTE — Plan of Care (Signed)

## 2024-02-27 NOTE — Progress Notes (Signed)
 PROGRESS NOTE    Shawn Maldonado  FMW:[id number] DOB: Oct 20, 1985 DOA: 02/26/2024 PCP: Delbert Clam, MD   Brief Narrative:  Shawn Maldonado is a 38 y.o. male with medical history significant of type 1 diabetes, chronic cannabis abuse, who has been using cannabis as a substitute for pain medication apparently due to chronic neuropathy of the right leg.  Patient presents with prolonged episodes of intractable nausea vomiting without overt diarrhea abdominal pain fevers chills chest pain sputum production or cough.  Patient admits to a prior history of presumed gastroparesis secondary to type 1 diabetes, unclear if this is ever been confirmed.  Assessment & Plan:   Principal Problem:   Cannabis hyperemesis syndrome concurrent with and due to cannabis abuse Active Problems:   Diabetes mellitus (HCC)   Type I diabetes mellitus (HCC)   Diabetic gastroparesis (HCC)  DKA Type I, insulin -dependent diabetes:  - Patient with profoundly elevated anion gap and beta hydroxybutyric levels - Transition to the ICU for closer monitoring, IV insulin  drip fluid boluses and medical care - Patient's anion gap has now closed around 1300, will transition to long-acting/sliding scale insulin , advance diet as tolerated. - Diabetic coordinator discussed with patient, he understands the risks of noncompliance, apparently has not had his continuous glucose monitor in some time which would explain his poor compliance and hyperglycemia, exacerbated by below intractable nausea vomiting  Hyperemesis secondary to ongoing cannabinoid use/abuse:  - Patient himself reports taking excessive amounts of THC - Longstanding history of cannabinoid use abuse and intractable nausea vomiting.  Unclear why patient continues to abuse this drug with such profound outcomes - Patient's DKA as above is likely provoked by intractable nausea vomiting and THC remains positive on patient's UDS - Lengthy discussion at bedside  about cessation of marijuana/THC use, patient declines adamantly that he will continue to use this medication/drug despite outcomes and recurrent hospitalizations.  Leukocytosis: Likely reactive versus hemoconcentration given above Chronic anemia: Borderline low, at baseline  DVT prophylaxis: enoxaparin  (LOVENOX ) injection 40 mg Start: 02/26/24 2030 Code Status:   Code Status: Full Code Family Communication: None present  Status is: Inpatient  Dispo: The patient is from: Home              Anticipated d/c is to: Home              Anticipated d/c date is: 24 to 48 hours pending tolerance of p.o.              Patient currently not medically stable for discharge  Consultants:  None  Procedures:  None  Antimicrobials:  None indicated  Subjective: No acute issues or events overnight, this morning patient had worsening abdominal pain nausea vomiting in the setting of hyperglycemia, improving drastically with supportive measures.  Otherwise reports new onset hiccups but denies chest pain shortness of breath diarrhea constipation headache fevers or chills  Objective: Vitals:   02/26/24 1815 02/26/24 1936 02/26/24 2343 02/27/24 0345  BP: (!) 108/55 (!) 109/58 132/68 135/69  Pulse: (!) 124 (!) 127 (!) 125 (!) 110  Resp: 16 15 16 15   Temp:  99.1 F (37.3 C) 98.2 F (36.8 C) 98 F (36.7 C)  TempSrc:  Oral Oral Oral  SpO2: 100% 100% 100% 100%  Height:  5' 10 (1.778 m)      Intake/Output Summary (Last 24 hours) at 02/27/2024 0736 Last data filed at 02/27/2024 0308 Gross per 24 hour  Intake 1268.02 ml  Output 2000 ml  Net -731.98 ml  There were no vitals filed for this visit.  Examination:  General:  Pleasantly resting in bed, No acute distress. HEENT:  Normocephalic atraumatic.  Sclerae nonicteric, noninjected.  Extraocular movements intact bilaterally. Neck:  Without mass or deformity.  Trachea is midline. Lungs:  Clear to auscultate bilaterally without rhonchi, wheeze,  or rales. Heart:  Regular rate and rhythm.  Without murmurs, rubs, or gallops. Abdomen:  Soft, nontender, nondistended.  Without guarding or rebound. Extremities: Without cyanosis, clubbing, edema, or obvious deformity. Skin:  Warm and dry, no erythema.  Data Reviewed: I have personally reviewed following labs and imaging studies  CBC: Recent Labs  Lab 02/24/24 1548 02/26/24 0823 02/26/24 2102 02/27/24 0438  WBC 14.3* 11.1* 20.2* 18.6*  NEUTROABS 12.9* 8.7*  --   --   HGB 12.1* 12.9* 11.8* 11.5*  HCT 37.9* 39.9 35.6* 37.5*  MCV 95.9 92.8 95.4 98.9  PLT 223 257 265 258   Basic Metabolic Panel: Recent Labs  Lab 02/24/24 1647 02/26/24 0823 02/26/24 2102 02/27/24 0438  NA 144 137  --  135  K 4.4 3.9  --  5.0  CL 108 100  --  100  CO2 21* 20*  --  10*  GLUCOSE 94 265*  --  488*  BUN 14 16  --  21*  CREATININE 0.70 0.86 1.06 1.06  CALCIUM  9.3 9.9  --  9.1  MG 1.8  --   --   --    GFR: CrCl cannot be calculated (Unknown ideal weight.). Liver Function Tests: Recent Labs  Lab 02/24/24 1647 02/26/24 0823 02/27/24 0438  AST 36 44* 27  ALT 24 31 26   ALKPHOS 69 80 80  BILITOT 1.0 1.5* 1.4*  PROT 6.7 7.8 6.9  ALBUMIN 4.2 4.7 4.1   Recent Labs  Lab 02/26/24 0823  LIPASE 18   No results for input(s): AMMONIA in the last 168 hours. Coagulation Profile: No results for input(s): INR, PROTIME in the last 168 hours. Cardiac Enzymes: No results for input(s): CKTOTAL, CKMB, CKMBINDEX, TROPONINI in the last 168 hours. BNP (last 3 results) No results for input(s): PROBNP in the last 8760 hours. HbA1C: Recent Labs    02/26/24 2102  HGBA1C 9.0*   CBG: Recent Labs  Lab 02/26/24 1937 02/26/24 2129 02/27/24 0009 02/27/24 0343 02/27/24 0614  GLUCAP 389* 389* 340* 472* 319*   Lipid Profile: No results for input(s): CHOL, HDL, LDLCALC, TRIG, CHOLHDL, LDLDIRECT in the last 72 hours. Thyroid Function Tests: No results for input(s): TSH,  T4TOTAL, FREET4, T3FREE, THYROIDAB in the last 72 hours. Anemia Panel: No results for input(s): VITAMINB12, FOLATE, FERRITIN, TIBC, IRON, RETICCTPCT in the last 72 hours. Sepsis Labs: No results for input(s): PROCALCITON, LATICACIDVEN in the last 168 hours.  No results found for this or any previous visit (from the past 240 hours).       Radiology Studies: CT Angio Chest PE W and/or Wo Contrast Result Date: 02/26/2024 CLINICAL DATA:  Concern for pulmonary embolism.  Abdominal pain. EXAM: CT ANGIOGRAPHY CHEST CT ABDOMEN AND PELVIS WITH CONTRAST TECHNIQUE: Multidetector CT imaging of the chest was performed using the standard protocol during bolus administration of intravenous contrast. Multiplanar CT image reconstructions and MIPs were obtained to evaluate the vascular anatomy. Multidetector CT imaging of the abdomen and pelvis was performed using the standard protocol during bolus administration of intravenous contrast. RADIATION DOSE REDUCTION: This exam was performed according to the departmental dose-optimization program which includes automated exposure control, adjustment of the mA and/or kV according  to patient size and/or use of iterative reconstruction technique. CONTRAST:  OMNIPAQUE  IOHEXOL  350 MG/ML SOLN COMPARISON:  None Available. FINDINGS: Evaluation is limited due to streak artifact caused by patient's arms as well as diffuse edema and anasarca. CTA CHEST FINDINGS Cardiovascular: There is no cardiomegaly or pericardial effusion. The thoracic aorta is unremarkable. Evaluation of the pulmonary arteries is limited due to respiratory motion as well as factors above. No pulmonary artery embolus identified. Mediastinum/Nodes: No hilar or mediastinal adenopathy. The esophagus is grossly unremarkable. No mediastinal fluid collection. Lungs/Pleura: No focal consolidation, pleural effusion, pneumothorax. The central airways are patent. Musculoskeletal: Diffuse chest  wall edema and anasarca. No acute osseous pathology. Review of the MIP images confirms the above findings. CT ABDOMEN and PELVIS FINDINGS No intra-abdominal free air or free fluid. Hepatobiliary: The liver is unremarkable. No acute infiltration. The gallbladder is unremarkable. Pancreas: The pancreas is grossly unremarkable as visualized. Spleen: Normal in size without focal abnormality. Adrenals/Urinary Tract: The adrenal glands are poorly visualized. There is mild right hydronephrosis. The visualized ureters and urinary probable. Stomach/Bowel: Evaluation of the bowel is limited in the absence of oral contrast as well as paucity of intra-abdominal fat. Diffuse thickened appearance of the colon may be related to underdistention or represent colitis. No bowel obstruction. The appendix is poorly visualized. The visualized appendix appears unremarkable. Vascular/Lymphatic: The abdominal aorta and IVC unremarkable. No portal venous gas. There is no adenopathy. Reproductive: The prostate gland is gross unremarkable. Other: None Musculoskeletal: No acute osseous pathology. Review of the MIP images confirms the above findings. IMPRESSION: 1. No CT evidence of pulmonary embolism. 2. Underdistention of the colon versus colitis. No bowel obstruction. 3. Mild right hydronephrosis. 4. Anasarca. Electronically Signed   By: Vanetta Chou M.D.   On: 02/26/2024 15:32   CT ABDOMEN PELVIS W CONTRAST Result Date: 02/26/2024 CLINICAL DATA:  Concern for pulmonary embolism.  Abdominal pain. EXAM: CT ANGIOGRAPHY CHEST CT ABDOMEN AND PELVIS WITH CONTRAST TECHNIQUE: Multidetector CT imaging of the chest was performed using the standard protocol during bolus administration of intravenous contrast. Multiplanar CT image reconstructions and MIPs were obtained to evaluate the vascular anatomy. Multidetector CT imaging of the abdomen and pelvis was performed using the standard protocol during bolus administration of intravenous contrast.  RADIATION DOSE REDUCTION: This exam was performed according to the departmental dose-optimization program which includes automated exposure control, adjustment of the mA and/or kV according to patient size and/or use of iterative reconstruction technique. CONTRAST:  OMNIPAQUE  IOHEXOL  350 MG/ML SOLN COMPARISON:  None Available. FINDINGS: Evaluation is limited due to streak artifact caused by patient's arms as well as diffuse edema and anasarca. CTA CHEST FINDINGS Cardiovascular: There is no cardiomegaly or pericardial effusion. The thoracic aorta is unremarkable. Evaluation of the pulmonary arteries is limited due to respiratory motion as well as factors above. No pulmonary artery embolus identified. Mediastinum/Nodes: No hilar or mediastinal adenopathy. The esophagus is grossly unremarkable. No mediastinal fluid collection. Lungs/Pleura: No focal consolidation, pleural effusion, pneumothorax. The central airways are patent. Musculoskeletal: Diffuse chest wall edema and anasarca. No acute osseous pathology. Review of the MIP images confirms the above findings. CT ABDOMEN and PELVIS FINDINGS No intra-abdominal free air or free fluid. Hepatobiliary: The liver is unremarkable. No acute infiltration. The gallbladder is unremarkable. Pancreas: The pancreas is grossly unremarkable as visualized. Spleen: Normal in size without focal abnormality. Adrenals/Urinary Tract: The adrenal glands are poorly visualized. There is mild right hydronephrosis. The visualized ureters and urinary probable. Stomach/Bowel: Evaluation of the  bowel is limited in the absence of oral contrast as well as paucity of intra-abdominal fat. Diffuse thickened appearance of the colon may be related to underdistention or represent colitis. No bowel obstruction. The appendix is poorly visualized. The visualized appendix appears unremarkable. Vascular/Lymphatic: The abdominal aorta and IVC unremarkable. No portal venous gas. There is no adenopathy.  Reproductive: The prostate gland is gross unremarkable. Other: None Musculoskeletal: No acute osseous pathology. Review of the MIP images confirms the above findings. IMPRESSION: 1. No CT evidence of pulmonary embolism. 2. Underdistention of the colon versus colitis. No bowel obstruction. 3. Mild right hydronephrosis. 4. Anasarca. Electronically Signed   By: Vanetta Chou M.D.   On: 02/26/2024 15:32        Scheduled Meds:  enoxaparin  (LOVENOX ) injection  40 mg Subcutaneous Q24H   insulin  aspart  18 Units Subcutaneous Once   Continuous Infusions:  dextrose  5% lactated ringers      insulin      lactated ringers      lactated ringers  40 mL/hr at 02/27/24 9692   lactated ringers      potassium chloride        LOS: 1 day   Time spent: 55 min  Elsie JAYSON [place], DO [facility]  If 7PM-7AM, please contact night-coverage [email or web address]  02/27/2024, 7:36 AM

## 2024-02-27 NOTE — Plan of Care (Signed)
  Problem: Education: Goal: Knowledge of General Education information will improve Description: Including pain rating scale, medication(s)/side effects and non-pharmacologic comfort measures Outcome: Progressing   Problem: Clinical Measurements: Goal: Will remain free from infection Outcome: Progressing Goal: Respiratory complications will improve Outcome: Progressing   Problem: Elimination: Goal: Will not experience complications related to bowel motility Outcome: Progressing Goal: Will not experience complications related to urinary retention Outcome: Progressing   Problem: Safety: Goal: Ability to remain free from injury will improve Outcome: Progressing   Problem: Skin Integrity: Goal: Risk for impaired skin integrity will decrease Outcome: Progressing   Problem: Health Behavior/Discharge Planning: Goal: Ability to manage health-related needs will improve Outcome: Not Progressing   Problem: Clinical Measurements: Goal: Ability to maintain clinical measurements within normal limits will improve Outcome: Not Progressing Goal: Diagnostic test results will improve Outcome: Not Progressing   Problem: Activity: Goal: Risk for activity intolerance will decrease Outcome: Not Progressing   Problem: Nutrition: Goal: Adequate nutrition will be maintained Outcome: Not Progressing   Problem: Pain Managment: Goal: General experience of comfort will improve and/or be controlled Outcome: Not Progressing   Problem: Education: Goal: Ability to describe self-care measures that may prevent or decrease complications (Diabetes Survival Skills Education) will improve Outcome: Not Progressing   Problem: Coping: Goal: Ability to adjust to condition or change in health will improve Outcome: Not Progressing   Problem: Fluid Volume: Goal: Ability to maintain a balanced intake and output will improve Outcome: Not Progressing   Problem: Metabolic: Goal: Ability to maintain  appropriate glucose levels will improve Outcome: Not Progressing   Problem: Nutritional: Goal: Maintenance of adequate nutrition will improve Outcome: Not Progressing Goal: Progress toward achieving an optimal weight will improve Outcome: Not Progressing   Problem: Metabolic: Goal: Ability to maintain appropriate glucose levels will improve Outcome: Not Progressing

## 2024-02-27 NOTE — Telephone Encounter (Signed)
 Pharmacy Patient Advocate Encounter  Received notification from [employer] that Prior Authorization for Dexcom G7 Sensors has been APPROVED from 02/27/24 to 08/25/2024. Ran test claim, Copay is $0. This test claim was processed through [facility]- copay amounts may vary at other pharmacies due to pharmacy/plan contracts, or as the patient moves through the different stages of their insurance plan.   PA #/Case ID/Reference #: [id number]

## 2024-02-28 ENCOUNTER — Encounter (HOSPITAL_COMMUNITY): Payer: Self-pay

## 2024-02-28 ENCOUNTER — Other Ambulatory Visit (HOSPITAL_COMMUNITY): Payer: Self-pay

## 2024-02-28 DIAGNOSIS — R1116 Cannabis hyperemesis syndrome: Secondary | ICD-10-CM | POA: Diagnosis not present

## 2024-02-28 DIAGNOSIS — F1210 Cannabis abuse, uncomplicated: Secondary | ICD-10-CM | POA: Diagnosis not present

## 2024-02-28 LAB — CBC
HCT: 31.2 % — ABNORMAL LOW (ref 39.0–52.0)
Hemoglobin: 10.6 g/dL — ABNORMAL LOW (ref 13.0–17.0)
MCH: 31.3 pg (ref 26.0–34.0)
MCHC: 34 g/dL (ref 30.0–36.0)
MCV: 92 fL (ref 80.0–100.0)
Platelets: 215 K/uL (ref 150–400)
RBC: 3.39 MIL/uL — ABNORMAL LOW (ref 4.22–5.81)
RDW: 12.3 % (ref 11.5–15.5)
WBC: 13.7 K/uL — ABNORMAL HIGH (ref 4.0–10.5)
nRBC: 0 % (ref 0.0–0.2)

## 2024-02-28 LAB — BASIC METABOLIC PANEL WITH GFR
Anion gap: 12 (ref 5–15)
Anion gap: 12 (ref 5–15)
BUN: 13 mg/dL (ref 6–20)
BUN: 11 mg/dL (ref 6–20)
CO2: 23 mmol/L (ref 22–32)
CO2: 23 mmol/L (ref 22–32)
Calcium: 8.5 mg/dL — ABNORMAL LOW (ref 8.9–10.3)
Calcium: 8.6 mg/dL — ABNORMAL LOW (ref 8.9–10.3)
Chloride: 102 mmol/L (ref 98–111)
Chloride: 100 mmol/L (ref 98–111)
Creatinine, Ser: 0.8 mg/dL (ref 0.61–1.24)
Creatinine, Ser: 0.76 mg/dL (ref 0.61–1.24)
GFR, Estimated: >60 mL/min (ref >60)
GFR, Estimated: >60 mL/min (ref >60)
Glucose, Bld: 161 mg/dL — ABNORMAL HIGH (ref 70–99)
Glucose, Bld: 220 mg/dL — ABNORMAL HIGH (ref 70–99)
Potassium: 3.5 mmol/L (ref 3.5–5.1)
Potassium: 3.6 mmol/L (ref 3.5–5.1)
Sodium: 136 mmol/L (ref 135–145)
Sodium: 136 mmol/L (ref 135–145)

## 2024-02-28 LAB — GLUCOSE, CAPILLARY
Glucose-Capillary: 251 mg/dL — ABNORMAL HIGH (ref 70–99)
Glucose-Capillary: 91 mg/dL (ref 70–99)

## 2024-02-28 MED ORDER — PHENOL 1.4 % MT LIQD
1.0000 | OROMUCOSAL | Status: DC | PRN
  Administered 2024-02-28: 1 via OROMUCOSAL
  Filled 2024-02-28: qty 177

## 2024-02-28 MED ORDER — INSULIN GLARGINE 100 UNIT/ML ~~LOC~~ SOLN
18.0000 [IU] | Freq: Every day | SUBCUTANEOUS | 0 refills | Status: DC
  Filled 2024-02-28: qty 20, 60d supply, fill #0

## 2024-02-28 MED ORDER — INSULIN GLARGINE-YFGN 100 UNIT/ML ~~LOC~~ SOLN
18.0000 [IU] | Freq: Every day | SUBCUTANEOUS | Status: DC
  Administered 2024-02-28: 18 [IU] via SUBCUTANEOUS
  Filled 2024-02-28: qty 0.18

## 2024-02-28 MED ORDER — DEXCOM G7 SENSOR MISC
11 refills | Status: DC
  Filled 2024-02-28: qty 3, 30d supply, fill #0

## 2024-02-28 NOTE — Progress Notes (Signed)
   02/28/24 1133  TOC Brief Assessment  Insurance and Status Reviewed ([place] [employer]ICA[employer]ID HEALTH PLAN / [place] [employer])  Patient has primary care physician Yes (Newlin, Enobong, MD)  Home environment has been reviewed Single family home  Prior level of function: Independent with ADL's  Prior/Current Home Services No current home services  Social Drivers of Health Review SDOH reviewed no interventions necessary  Readmission risk has been reviewed Yes  Transition of care needs no transition of care needs at this time

## 2024-02-28 NOTE — Discharge Summary (Signed)
 Physician Discharge Summary  Shawn Maldonado FMW:[id number] DOB: November 18, 1985 DOA: 02/26/2024  PCP: Delbert Clam, MD  Admit date: 02/26/2024 Discharge date: 02/28/2024  Admitted From: Home Disposition: Home  Recommendations for Outpatient Follow-up:  Follow up with PCP in 1-2 weeks  Home Health: None Equipment/Devices: None  Discharge Condition: Stable CODE STATUS: Full Diet recommendation: Low-carb low-salt diet  Brief/Interim Summary: Shawn Maldonado is a 38 y.o. male well-known to our facility with medical history significant of type 1 diabetes, chronic cannabis abuse, who has been using cannabis as a substitute for pain medication apparently due to chronic neuropathy of the right leg.  Patient presents with prolonged episodes of intractable nausea vomiting without overt diarrhea abdominal pain fevers chills chest pain sputum production or cough.  Patient admits to a prior history of presumed gastroparesis secondary to type 1 diabetes, unclear if this is ever been confirmed on imaging.  Patient admitted as above with intractable nausea vomiting likely secondary to ongoing cannabis abuse with acute on chronic cannabinoid hyperemesis.  Unfortunately during patient's episode while unable to tolerate p.o. he transition into DKA requiring IV insulin  and dextrose .  At this time he is now resolved from DKA, his intractable nausea vomiting -again secondary to cannabinoid hyperemesis syndrome -appear to be resolved as well.  Recommend close follow-up with PCP in the near future.  Lengthy discussion daily at bedside in regards to cessation of marijuana.  Patient indicates he has no inclination to stop or cut back on his marijuana use which he himself describes as heavy.    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 well off insulin  drip back on home insulin , increased long-acting dose as below - Diabetic coordinator discussed with patient, he understands the risks of noncompliance, apparently has not had his continuous glucose monitor in some time which would explain his poor compliance and hyperglycemia, exacerbated by below intractable nausea vomiting   Hyperemesis secondary to ongoing cannabinoid use/abuse:  - Patient himself reports taking excessive amounts of THC - Longstanding history of cannabinoid use abuse and intractable nausea vomiting.  Unclear why patient continues to abuse this drug with such profound outcomes - Lengthy discussion at bedside about cessation of marijuana/THC use, patient declines adamantly that he will continue to use this medication/drug despite outcomes and recurrent hospitalizations.   Leukocytosis: Likely reactive versus hemoconcentration given above Chronic anemia: Borderline low, at baseline-repeat labs outpatient  Discharge Instructions  Discharge Instructions     Call MD for:  difficulty breathing, headache or visual disturbances   Complete by: As directed    Call MD for:  extreme fatigue   Complete by: As directed    Call MD for:  hives   Complete by: As directed    Call MD for:  persistant dizziness or light-headedness   Complete by: As directed    Call MD for:  persistant nausea and vomiting   Complete by: As directed    Call MD for:  severe uncontrolled pain   Complete by: As directed    Call MD for:  temperature >100.4   Complete by: As directed    Diet - low sodium heart healthy   Complete  by: As directed    Diet Carb Modified   Complete by: As directed    Increase activity slowly   Complete by: As directed        Allergies as of 02/28/2024   No Known Allergies      Medication List     STOP taking these medications    hydrOXYzine  25 MG tablet Commonly known as: ATARAX        TAKE these medications    Accu-Chek Guide Test test strip Generic drug: glucose blood Use to check blood sugar 3 times daily.   Accu-Chek Guide w/Device Kit Use to check blood sugar 3 times daily.   Accu-Chek Softclix Lancets lancets Use to check blood sugar 3 times daily.   ALPRAZolam  0.5 MG tablet Commonly known as: XANAX  Take 1 tablet (0.5 mg total) by mouth at bedtime as needed for anxiety.   Dexcom G7 Sensor Misc USE TO CHECK BLOOD SUGAR THREE TIMES DAILY AND CHANGE SENSORS ONCE EVERY 10 DAYS   insulin  aspart 100 UNIT/ML injection Commonly known as: novoLOG  Inject 0-12 Units into the skin 3 (three) times daily before meals. Per sliding scale   Lantus  100 UNIT/ML injection Generic drug: insulin  glargine Inject 0.18 mLs (18 Units total) into the skin at bedtime. What changed: how much to take   ondansetron  4 MG tablet Commonly known as: ZOFRAN  Take 1 tablet (4 mg total) by mouth every 4 (four) hours as needed for nausea or vomiting.   sildenafil  100 MG tablet Commonly known as: Viagra  Take 1 tablet (100 mg total) by mouth daily as needed for erectile dysfunction. At least 24 hours between doses        Follow-up Information     Call  Delbert Clam, MD.   Specialty: Family Medicine Why: Follow up from ER visit Contact information: [address] [place] [address] [place] [address] [phone]         Merrily. Schedule an appointment as soon as possible for a visit in 1 week.   Contact information: [address]  [address] [place] [address] [phone]         Go to  [facility] Emergency Department at [facility].   Specialty: Emergency Medicine Why: As needed, If symptoms worsen Contact information: [address] [place]   [address] [phone]               No Known Allergies  Consultations: None  Procedures/Studies: CT Angio Chest PE W and/or Wo Contrast Result Date: 02/26/2024 CLINICAL DATA:  Concern for pulmonary embolism.  Abdominal pain. EXAM: CT ANGIOGRAPHY CHEST CT ABDOMEN AND PELVIS WITH CONTRAST TECHNIQUE: Multidetector CT imaging of the chest was performed using the standard protocol during bolus administration of intravenous contrast. Multiplanar CT image reconstructions and MIPs were obtained to evaluate the vascular anatomy. Multidetector CT imaging of the abdomen and pelvis was performed using the standard protocol during bolus administration of intravenous contrast. RADIATION DOSE REDUCTION: This exam was performed according to the departmental dose-optimization program which includes automated exposure control, adjustment of the mA and/or kV according to patient size and/or use of iterative reconstruction technique. CONTRAST:  OMNIPAQUE  IOHEXOL  350 MG/ML SOLN COMPARISON:  None Available. FINDINGS: Evaluation is limited due to streak artifact caused by patient's arms as well as diffuse edema and anasarca. CTA CHEST FINDINGS Cardiovascular: There is no cardiomegaly or pericardial effusion. The thoracic aorta is unremarkable. Evaluation of the pulmonary arteries is limited due to respiratory motion as well  as factors above. No pulmonary artery embolus identified. Mediastinum/Nodes: No hilar or mediastinal adenopathy. The esophagus is grossly unremarkable. No mediastinal fluid collection. Lungs/Pleura: No focal consolidation, pleural effusion, pneumothorax. The central airways are patent. Musculoskeletal: Diffuse chest wall edema and anasarca. No acute osseous pathology. Review of the MIP images confirms the above findings. CT ABDOMEN and PELVIS FINDINGS No intra-abdominal free air or free fluid. Hepatobiliary: The liver is unremarkable. No acute infiltration. The gallbladder is unremarkable.  Pancreas: The pancreas is grossly unremarkable as visualized. Spleen: Normal in size without focal abnormality. Adrenals/Urinary Tract: The adrenal glands are poorly visualized. There is mild right hydronephrosis. The visualized ureters and urinary probable. Stomach/Bowel: Evaluation of the bowel is limited in the absence of oral contrast as well as paucity of intra-abdominal fat. Diffuse thickened appearance of the colon may be related to underdistention or represent colitis. No bowel obstruction. The appendix is poorly visualized. The visualized appendix appears unremarkable. Vascular/Lymphatic: The abdominal aorta and IVC unremarkable. No portal venous gas. There is no adenopathy. Reproductive: The prostate gland is gross unremarkable. Other: None Musculoskeletal: No acute osseous pathology. Review of the MIP images confirms the above findings. IMPRESSION: 1. No CT evidence of pulmonary embolism. 2. Underdistention of the colon versus colitis. No bowel obstruction. 3. Mild right hydronephrosis. 4. Anasarca. Electronically Signed   By: Vanetta Chou M.D.   On: 02/26/2024 15:32   CT ABDOMEN PELVIS W CONTRAST Result Date: 02/26/2024 CLINICAL DATA:  Concern for pulmonary embolism.  Abdominal pain. EXAM: CT ANGIOGRAPHY CHEST CT ABDOMEN AND PELVIS WITH CONTRAST TECHNIQUE: Multidetector CT imaging of the chest was performed using the standard protocol during bolus administration of intravenous contrast. Multiplanar CT image reconstructions and MIPs were obtained to evaluate the vascular anatomy. Multidetector CT imaging of the abdomen and pelvis was performed using the standard protocol during bolus administration of intravenous contrast. RADIATION DOSE REDUCTION: This exam was performed according to the departmental dose-optimization program which includes automated exposure control, adjustment of the mA and/or kV according to patient size and/or use of iterative reconstruction technique. CONTRAST:   OMNIPAQUE  IOHEXOL  350 MG/ML SOLN COMPARISON:  None Available. FINDINGS: Evaluation is limited due to streak artifact caused by patient's arms as well as diffuse edema and anasarca. CTA CHEST FINDINGS Cardiovascular: There is no cardiomegaly or pericardial effusion. The thoracic aorta is unremarkable. Evaluation of the pulmonary arteries is limited due to respiratory motion as well as factors above. No pulmonary artery embolus identified. Mediastinum/Nodes: No hilar or mediastinal adenopathy. The esophagus is grossly unremarkable. No mediastinal fluid collection. Lungs/Pleura: No focal consolidation, pleural effusion, pneumothorax. The central airways are patent. Musculoskeletal: Diffuse chest wall edema and anasarca. No acute osseous pathology. Review of the MIP images confirms the above findings. CT ABDOMEN and PELVIS FINDINGS No intra-abdominal free air or free fluid. Hepatobiliary: The liver is unremarkable. No acute infiltration. The gallbladder is unremarkable. Pancreas: The pancreas is grossly unremarkable as visualized. Spleen: Normal in size without focal abnormality. Adrenals/Urinary Tract: The adrenal glands are poorly visualized. There is mild right hydronephrosis. The visualized ureters and urinary probable. Stomach/Bowel: Evaluation of the bowel is limited in the absence of oral contrast as well as paucity of intra-abdominal fat. Diffuse thickened appearance of the colon may be related to underdistention or represent colitis. No bowel obstruction. The appendix is poorly visualized. The visualized appendix appears unremarkable. Vascular/Lymphatic: The abdominal aorta and IVC unremarkable. No portal venous gas. There is no adenopathy. Reproductive: The prostate gland is gross unremarkable. Other: None Musculoskeletal: No  acute osseous pathology. Review of the MIP images confirms the above findings. IMPRESSION: 1. No CT evidence of pulmonary embolism. 2. Underdistention of the colon versus colitis. No  bowel obstruction. 3. Mild right hydronephrosis. 4. Anasarca. Electronically Signed   By: Vanetta Chou M.D.   On: 02/26/2024 15:32     Subjective: No acute issues or events overnight denies nausea vomiting diarrhea constipation headache fevers chills or chest pain   Discharge Exam: Vitals:   02/28/24 0800 02/28/24 1140  BP:    Pulse:    Resp:    Temp: 98.1 F (36.7 C) 99.1 F (37.3 C)  SpO2:     Vitals:   02/28/24 0500 02/28/24 0600 02/28/24 0800 02/28/24 1140  BP:  138/73    Pulse: 87 66    Resp: 16 18    Temp:   98.1 F (36.7 C) 99.1 F (37.3 C)  TempSrc:   Oral Oral  SpO2: 99% 98%    Weight:      Height:        General: Pt is alert, awake, not in acute distress Cardiovascular: RRR, S1/S2 +, no rubs, no gallops Respiratory: CTA bilaterally, no wheezing, no rhonchi Abdominal: Soft, NT, ND, bowel sounds + Extremities: no edema, no cyanosis    The results of significant diagnostics from this hospitalization (including imaging, microbiology, ancillary and laboratory) are listed below for reference.     Microbiology: Recent Results (from the past 240 hours)  MRSA Next Gen by PCR, Nasal     Status: None   Collection Time: 02/27/24  8:46 AM   Specimen: Nasal Mucosa; Nasal Swab  Result Value Ref Range Status   MRSA by PCR Next Gen NOT DETECTED NOT DETECTED Final    Comment: (NOTE) The GeneXpert MRSA Assay (FDA approved for NASAL specimens only), is one component of a comprehensive MRSA colonization surveillance program. It is not intended to diagnose MRSA infection nor to guide or monitor treatment for MRSA infections. Test performance is not FDA approved in patients less than 33 years old. Performed at [facility], [address], [place], [place] [address]      Labs: BNP (last 3 results) No results for input(s): BNP in the last 8760 hours. Basic Metabolic Panel: Recent Labs  Lab 02/24/24 1647 02/26/24 9176 02/26/24 2102  02/27/24 0438 02/27/24 1111 02/28/24 0319 02/28/24 0717  NA 144 137  --  135 140 136 136  K 4.4 3.9  --  5.0 4.0 3.5 3.6  CL 108 100  --  100 108 102 100  CO2 21* 20*  --  10* 23 23 23   GLUCOSE 94 265*  --  488* 126* 161* 220*  BUN 14 16  --  21* 16 13 11   CREATININE 0.70 0.86 1.06 1.06 0.86 0.80 0.76  CALCIUM  9.3 9.9  --  9.1 8.8* 8.5* 8.6*  MG 1.8  --   --   --   --   --   --    Liver Function Tests: Recent Labs  Lab 02/24/24 1647 02/26/24 0823 02/27/24 0438  AST 36 44* 27  ALT 24 31 26   ALKPHOS 69 80 80  BILITOT 1.0 1.5* 1.4*  PROT 6.7 7.8 6.9  ALBUMIN 4.2 4.7 4.1   Recent Labs  Lab 02/26/24 0823  LIPASE 18   No results for input(s): AMMONIA in the last 168 hours. CBC: Recent Labs  Lab 02/24/24 1548 02/26/24 0823 02/26/24 2102 02/27/24 0438 02/28/24 0717  WBC 14.3* 11.1*  20.2* 18.6* 13.7*  NEUTROABS 12.9* 8.7*  --   --   --   HGB 12.1* 12.9* 11.8* 11.5* 10.6*  HCT 37.9* 39.9 35.6* 37.5* 31.2*  MCV 95.9 92.8 95.4 98.9 92.0  PLT 223 257 265 258 215   Cardiac Enzymes: No results for input(s): CKTOTAL, CKMB, CKMBINDEX, TROPONINI in the last 168 hours. BNP: Invalid input(s): POCBNP CBG: Recent Labs  Lab 02/27/24 1551 02/27/24 1712 02/27/24 2136 02/28/24 0756 02/28/24 1107  GLUCAP 113* 126* 99 251* 91   D-Dimer No results for input(s): DDIMER in the last 72 hours. Hgb A1c Recent Labs    02/26/24 2102  HGBA1C 9.0*   Lipid Profile No results for input(s): CHOL, HDL, LDLCALC, TRIG, CHOLHDL, LDLDIRECT in the last 72 hours. Thyroid function studies No results for input(s): TSH, T4TOTAL, T3FREE, THYROIDAB in the last 72 hours.  Invalid input(s): FREET3 Anemia work up No results for input(s): VITAMINB12, FOLATE, FERRITIN, TIBC, IRON, RETICCTPCT in the last 72 hours. Urinalysis    Component Value Date/Time   COLORURINE STRAW (A) 02/26/2024 0956   APPEARANCEUR CLEAR 02/26/2024 0956   LABSPEC 1.021  02/26/2024 0956   PHURINE 5.0 02/26/2024 0956   GLUCOSEU >=500 (A) 02/26/2024 0956   HGBUR NEGATIVE 02/26/2024 0956   BILIRUBINUR NEGATIVE 02/26/2024 0956   KETONESUR 20 (A) 02/26/2024 0956   PROTEINUR NEGATIVE 02/26/2024 0956   UROBILINOGEN 0.2 09/24/2012 1037   NITRITE NEGATIVE 02/26/2024 0956   LEUKOCYTESUR NEGATIVE 02/26/2024 0956   Sepsis Labs Recent Labs  Lab 02/26/24 0823 02/26/24 2102 02/27/24 0438 02/28/24 0717  WBC 11.1* 20.2* 18.6* 13.7*   Microbiology Recent Results (from the past 240 hours)  MRSA Next Gen by PCR, Nasal     Status: None   Collection Time: 02/27/24  8:46 AM   Specimen: Nasal Mucosa; Nasal Swab  Result Value Ref Range Status   MRSA by PCR Next Gen NOT DETECTED NOT DETECTED Final    Comment: (NOTE) The GeneXpert MRSA Assay (FDA approved for NASAL specimens only), is one component of a comprehensive MRSA colonization surveillance program. It is not intended to diagnose MRSA infection nor to guide or monitor treatment for MRSA infections. Test performance is not FDA approved in patients less than 1 years old. Performed at [facility], [address], [place], [place] [address]      Time coordinating discharge: Over 30 minutes  SIGNED:   Elsie JAYSON [place], DO [facility] 02/28/2024, 3:05 PM Pager   If 7PM-7AM, please contact night-coverage [email or web address]

## 2024-02-28 NOTE — Plan of Care (Signed)
  Problem: Education: Goal: Knowledge of General Education information will improve Description: Including pain rating scale, medication(s)/side effects and non-pharmacologic comfort measures Outcome: Progressing   Problem: Health Behavior/Discharge Planning: Goal: Ability to manage health-related needs will improve Outcome: Progressing   Problem: Nutrition: Goal: Adequate nutrition will be maintained Outcome: Progressing   Problem: Coping: Goal: Level of anxiety will decrease Outcome: Progressing   Problem: Pain Managment: Goal: General experience of comfort will improve and/or be controlled Outcome: Progressing   Problem: Safety: Goal: Ability to remain free from injury will improve Outcome: Progressing   Problem: Education: Goal: Ability to describe self-care measures that may prevent or decrease complications (Diabetes Survival Skills Education) will improve Outcome: Progressing

## 2024-02-29 ENCOUNTER — Telehealth: Payer: Self-pay | Admitting: *Deleted

## 2024-02-29 NOTE — Transitions of Care (Post Inpatient/ED Visit) (Signed)
 02/29/2024  Name: Shawn Maldonado MRN: [id number] DOB: 02/07/1986  Today's TOC FU Call Status: Today's TOC FU Call Status:: Successful TOC FU Call Completed TOC FU Call Complete Date: 02/29/24 Patient's Name and Date of Birth confirmed.  Transition Care Management Follow-up Telephone Call Date of Discharge: 02/28/24 Discharge Facility: [facility]) Type of Discharge: Inpatient Admission Primary Inpatient Discharge Diagnosis:: Cannabis hyperemesis syndrome concurrent with and due to cannabis abuse How have you been since you were released from the hospital?: Better Any questions or concerns?: No  Items Reviewed: Did you receive and understand the discharge instructions provided?: Yes Medications obtained,verified, and reconciled?: Yes (Medications Reviewed) Any new allergies since your discharge?: No Dietary orders reviewed?: Yes Type of Diet Ordered:: Low-carb low-salt diet Do you have support at home?: Yes People in Home [RPT]: spouse, child(ren), dependent Name of Support/Comfort Primary Source: [name]  Medications Reviewed Today: Medications Reviewed Today     Reviewed by [name] LABOR, RN (Registered Nurse) on 02/29/24 at 0932  Med List Status: <None>   Medication Order Taking? Sig Documenting Provider Last Dose Status Informant  Accu-Chek Softclix Lancets lancets [id number] Yes Use to check blood sugar 3 times daily. Newlin, Enobong, MD  Active Self, Pharmacy Records, Multiple Informants  ALPRAZolam  (XANAX ) 0.5 MG tablet [id number]  Take 1 tablet (0.5 mg total) by mouth at bedtime as needed for anxiety.  Patient not taking: Reported on 02/29/2024   [name], MD  Active Self, Pharmacy Records, Multiple Informants  Blood Glucose Monitoring Suppl (ACCU-CHEK GUIDE) w/Device KIT [id number] Yes Use to check blood sugar 3 times daily. Newlin, Enobong, MD  Active Self, Pharmacy Records, Multiple Informants  Continuous Glucose Sensor (DEXCOM G7 SENSOR)  MISC [id number] Yes USE TO CHECK BLOOD SUGAR THREE TIMES DAILY AND CHANGE SENSORS ONCE EVERY 10 DAYS [name], MD  Active   glucose blood (ACCU-CHEK GUIDE TEST) test strip [id number] Yes Use to check blood sugar 3 times daily. Newlin, Enobong, MD  Active Self, Pharmacy Records, Multiple Informants  insulin  aspart (NOVOLOG ) 100 UNIT/ML injection [id number] Yes Inject 0-12 Units into the skin 3 (three) times daily before meals. Per sliding scale Newlin, Enobong, MD  Active Self, Pharmacy Records, Multiple Informants  insulin  glargine (LANTUS ) 100 UNIT/ML injection [id number] Yes Inject 0.18 mLs (18 Units total) into the skin at bedtime.  Patient taking differently: Inject 18 Units into the skin every morning.   [name], MD  Active   ondansetron  (ZOFRAN ) 4 MG tablet [id number] Yes Take 1 tablet (4 mg total) by mouth every 4 (four) hours as needed for nausea or vomiting. Elnor Jayson LABOR, DO  Active   sildenafil  (VIAGRA ) 100 MG tablet [id number]  Take 1 tablet (100 mg total) by mouth daily as needed for erectile dysfunction. At least 24 hours between doses  Patient not taking: Reported on 02/29/2024   Newlin, Enobong, MD  Active Self, Pharmacy Records, Multiple Informants            Home Care and Equipment/Supplies: Were Home Health Services Ordered?: No Any new equipment or medical supplies ordered?: No  Functional Questionnaire: Do you need assistance with bathing/showering or dressing?: No Do you need assistance with meal preparation?: No Do you need assistance with eating?: No Do you have difficulty maintaining continence: No Do you need assistance with getting out of bed/getting out of a chair/moving?: No Do you have difficulty managing or taking your medications?: No  Follow up appointments reviewed: PCP Follow-up appointment confirmed?: Yes Date  of PCP follow-up appointment?: 04/01/24 (Patient declined scheduling an earlier appointment) Follow-up Provider: [facility]  [facility] Follow-up appointment confirmed?: NA Do you need transportation to your follow-up appointment?: No Do you understand care options if your condition(s) worsen?: Yes-patient verbalized understanding  SDOH Interventions Today    Flowsheet Row Most Recent Value  SDOH Interventions   Food Insecurity Interventions Intervention Not Indicated  Housing Interventions Intervention Not Indicated  Transportation Interventions Intervention Not Indicated  Utilities Interventions Intervention Not Indicated    Goals Addressed             This Visit's Progress    [facility]I Transitions of Care (TOC) Care Plan       Problems:  Recent Hospitalization for treatment of Cannabis Hyperemesis Syndrome Knowledge Deficit Related to DM management  Goal:  Over the next 30 days, the patient will not experience hospital readmission  Interventions:  Transitions of Care: Doctor Visits  - discussed the importance of doctor visits Post discharge activity limitations prescribed by provider reviewed Reviewed Signs and symptoms of infection Medications reviewed, discussed insulin  regimen in detail Discussed referral to LCSW for assistance with Cannabis cessation-patient declined Offered encouragement to patient for cessation, reports not cannabis since hospitalization Provided education on DM management Discussed the benefits of exercise for DM management and substance cessation Reviewed BS readings Reviewed diet  Patient Self Care Activities:  Attend all scheduled provider appointments Call provider office for new concerns or questions  Notify RN Care Manager of TOC call rescheduling needs Participate in Transition of Care Program/Attend TOC scheduled calls Take medications as prescribed   enter blood sugar readings and medication or insulin  into daily log take the blood sugar log to all doctor visits fill half of plate with vegetables manage portion size switch to sugar-free  drinks  Plan:  Telephone follow up appointment with care management team member scheduled for:  03/07/24 at 11am         Andrea Dimes RN, BSN [place]  [facility] RN Care Manager [phone]

## 2024-03-03 ENCOUNTER — Encounter: Payer: Self-pay | Admitting: Radiology

## 2024-03-07 ENCOUNTER — Encounter: Payer: Self-pay | Admitting: *Deleted

## 2024-03-07 ENCOUNTER — Telehealth: Payer: Self-pay | Admitting: *Deleted

## 2024-03-10 ENCOUNTER — Telehealth: Payer: Self-pay | Admitting: *Deleted

## 2024-03-10 NOTE — Transitions of Care (Post Inpatient/ED Visit) (Signed)
   03/10/2024  Name: Shawn Maldonado MRN: [id number] DOB: 1986/03/15  RNCM attempting to outreach patient for TOC week 2. Call answered by [name]. He is working and unable to complete [employer]. Patient request to call RNCM back.  Andrea Dimes RN, BSN [place]  [facility] RN Care Manager [phone]

## 2024-03-11 ENCOUNTER — Encounter: Payer: Self-pay | Admitting: *Deleted

## 2024-04-01 ENCOUNTER — Other Ambulatory Visit: Payer: Self-pay

## 2024-04-01 ENCOUNTER — Encounter: Payer: Self-pay | Admitting: Family Medicine

## 2024-04-01 ENCOUNTER — Ambulatory Visit: Attending: Family Medicine | Admitting: Family Medicine

## 2024-04-01 VITALS — BP 99/68 | HR 98 | Temp 98.3°F | Ht 70.0 in | Wt 133.8 lb

## 2024-04-01 DIAGNOSIS — N529 Male erectile dysfunction, unspecified: Secondary | ICD-10-CM

## 2024-04-01 DIAGNOSIS — Z794 Long term (current) use of insulin: Secondary | ICD-10-CM

## 2024-04-01 DIAGNOSIS — E1042 Type 1 diabetes mellitus with diabetic polyneuropathy: Secondary | ICD-10-CM | POA: Diagnosis not present

## 2024-04-01 DIAGNOSIS — E1069 Type 1 diabetes mellitus with other specified complication: Secondary | ICD-10-CM

## 2024-04-01 DIAGNOSIS — E119 Type 2 diabetes mellitus without complications: Secondary | ICD-10-CM

## 2024-04-01 MED ORDER — ACCU-CHEK GUIDE TEST VI STRP
ORAL_STRIP | 6 refills | Status: AC
  Filled 2024-04-01: qty 100, 33d supply, fill #0

## 2024-04-01 MED ORDER — SILDENAFIL CITRATE 100 MG PO TABS
100.0000 mg | ORAL_TABLET | Freq: Every day | ORAL | 2 refills | Status: AC | PRN
  Filled 2024-04-01: qty 10, 10d supply, fill #0

## 2024-04-01 MED ORDER — INSULIN GLARGINE 100 UNIT/ML ~~LOC~~ SOLN
18.0000 [IU] | Freq: Every day | SUBCUTANEOUS | 6 refills | Status: AC
  Filled 2024-04-01: qty 30, 166d supply, fill #0

## 2024-04-01 MED ORDER — ACCU-CHEK SOFTCLIX LANCETS MISC
6 refills | Status: AC
  Filled 2024-04-01: qty 100, 33d supply, fill #0

## 2024-04-01 MED ORDER — DEXCOM G7 SENSOR MISC
11 refills | Status: AC
  Filled 2024-04-01 – 2024-04-23 (×3): qty 3, 30d supply, fill #0

## 2024-04-01 MED ORDER — DULOXETINE HCL 60 MG PO CPEP
60.0000 mg | ORAL_CAPSULE | Freq: Every day | ORAL | 3 refills | Status: AC
  Filled 2024-04-01: qty 30, 30d supply, fill #0
  Filled 2024-04-28: qty 30, 30d supply, fill #1

## 2024-04-01 NOTE — Patient Instructions (Signed)
 VISIT SUMMARY:  Today, we discussed your ongoing management of diabetes, anxiety, and other health concerns. We reviewed your current medications and made some adjustments to better control your symptoms and improve your overall health.  YOUR PLAN:  -TYPE 1 DIABETES MELLITUS WITH DIABETIC POLYNEUROPATHY: Type 1 diabetes is a condition where your body does not produce insulin , leading to high blood sugar levels. Diabetic polyneuropathy is nerve damage caused by high blood sugar. We will continue your current insulin  regimen with Lantus  and Novolog , and I have refilled your prescriptions. I also ordered supplies for fingerstick glucose monitoring and sent a refill for your continuous glucose monitor to the pharmacy. We will reassess your A1c at the end of January.  -ANXIETY DISORDER: Anxiety disorder is a condition characterized by excessive worry and fear. We have prescribed duloxetine  (Cymbalta ) to help manage your anxiety and neuropathy. Please monitor how you respond to this medication and let us  know if it is not effective.  -MALE ERECTILE DYSFUNCTION: Erectile dysfunction is the inability to get or keep an erection firm enough for sex. We have refilled your prescription for sildenafil  (Viagra ).  INSTRUCTIONS:  Please follow up at the end of January for a reassessment of your A1c levels. Continue monitoring your blood sugar levels as instructed and keep track of your response to the new medication, duloxetine . If you experience any issues or have concerns, contact our office.

## 2024-04-01 NOTE — Progress Notes (Signed)
 Subjective:  Patient ID: Shawn Maldonado, male    DOB: [birth year]  Age: 38 y.o. MRN: [id number]  CC: Medical Management of Chronic Issues     Discussed the use of AI scribe software for clinical note transcription with the patient, who gave verbal consent to proceed.  History of Present Illness Shawn Maldonado is a 38 year old male with type 1 diabetes mellitus, neuropathy and anxiety who presents for follow-up on medication management and diabetes control.  He has painful diabetic neuropathy with shots and stabbing pains worse at night. Gabapentin  improves symptoms, and he sometimes alternates it with Tylenol  1000 mg. He is hesitant to use gabapentin  regularly because of fear of dependency. He stopped tramadol  in the past after withdrawal symptoms.   He has chronic anxiety and previously tried Cymbalta  and received Xanax  in the hospital, both of which helped, but he is not on them now.  He has type 1 diabetes with A1c 9 in October. He uses Lantus  18 units every morning and Novolog  before meals. He has not been able to obtain his continuous glucose monitor due to insurance issues and has had pharmacy problems obtaining some prescriptions and glucose monitoring supplies.  He had an ED visit with a diagnosis of cannabis hyperemesis syndrome and per notes he uses cannabis for neuropathy which he endorses also adding that it helps with his anxiety. ED notes also had a question about gastroparesis but a gastric emptying study in 2024 was normal. He had an episode of nausea and vomiting in October and per patient he thought this to be food-related .     Past Medical History:  Diagnosis Date   Diabetes mellitus without complication (HCC)     Past Surgical History:  Procedure Laterality Date   MOUTH SURGERY      Family History  Problem Relation Age of Onset   Stroke Father    CAD Other     Social History   Socioeconomic History   Marital status: Significant Other     Spouse name: Not on file   Number of children: 2   Years of education: Not on file   Highest education level: GED or equivalent  Occupational History   Occupation: Works in a science writer.  Tobacco Use   Smoking status: Former    Types: Cigarettes   Smokeless tobacco: Never  Vaping Use   Vaping status: Never Used  Substance and Sexual Activity   Alcohol use: Yes    Comment: social   Drug use: Yes    Types: Marijuana   Sexual activity: Never  Other Topics Concern   Not on file  Social History Narrative   Lives with girlfriend.     Social Drivers of Corporate Investment Banker Strain: Low Risk  (01/01/2024)   Overall Financial Resource Strain (CARDIA)    Difficulty of Paying Living Expenses: Not very hard  Food Insecurity: No Food Insecurity (02/29/2024)   Hunger Vital Sign    Worried About Running Out of Food in the Last Year: Never true    Ran Out of Food in the Last Year: Never true  Transportation Needs: No Transportation Needs (02/29/2024)   PRAPARE - Administrator, Civil Service (Medical): No    Lack of Transportation (Non-Medical): No  Physical Activity: Insufficiently Active (01/01/2024)   Exercise Vital Sign    Days of Exercise per Week: 3 days    Minutes of Exercise per Session: 20 min  Stress: Stress Concern  Present (01/01/2024)   [employer] of Occupational Health - Occupational Stress Questionnaire    Feeling of Stress: To some extent  Social Connections: Moderately Isolated (01/01/2024)   Social Connection and Isolation Panel    Frequency of Communication with Friends and Family: Once a week    Frequency of Social Gatherings with Friends and Family: More than three times a week    Attends Religious Services: Patient declined    Database Administrator or Organizations: No    Attends Engineer, Structural: Not on file    Marital Status: Living with partner    No Known Allergies  Outpatient Medications Prior to Visit  Medication  Sig Dispense Refill   insulin  aspart (NOVOLOG ) 100 UNIT/ML injection Inject 0-12 Units into the skin 3 (three) times daily before meals. Per sliding scale 30 mL 6   Continuous Glucose Sensor (DEXCOM G7 SENSOR) MISC USE TO CHECK BLOOD SUGAR THREE TIMES DAILY AND CHANGE SENSORS ONCE EVERY 10 DAYS 3 each 11   insulin  glargine (LANTUS ) 100 UNIT/ML injection Inject 0.18 mLs (18 Units total) into the skin at bedtime. 30 mL 0   sildenafil  (VIAGRA ) 100 MG tablet Take 1 tablet (100 mg total) by mouth daily as needed for erectile dysfunction. At least 24 hours between doses 10 tablet 2   Blood Glucose Monitoring Suppl (ACCU-CHEK GUIDE) w/Device KIT Use to check blood sugar 3 times daily. (Patient not taking: Reported on 04/01/2024) 1 kit 0   ondansetron  (ZOFRAN ) 4 MG tablet Take 1 tablet (4 mg total) by mouth every 4 (four) hours as needed for nausea or vomiting. (Patient not taking: Reported on 04/01/2024) 6 tablet 0   Accu-Chek Softclix Lancets lancets Use to check blood sugar 3 times daily. 100 each 6   ALPRAZolam  (XANAX ) 0.5 MG tablet Take 1 tablet (0.5 mg total) by mouth at bedtime as needed for anxiety. (Patient not taking: Reported on 04/01/2024) 6 tablet 0   glucose blood (ACCU-CHEK GUIDE TEST) test strip Use to check blood sugar 3 times daily. (Patient not taking: Reported on 04/01/2024) 100 each 6   No facility-administered medications prior to visit.     ROS Review of Systems  Constitutional:  Negative for activity change and appetite change.  HENT:  Negative for sinus pressure and sore throat.   Respiratory:  Negative for chest tightness, shortness of breath and wheezing.   Cardiovascular:  Negative for chest pain and palpitations.  Gastrointestinal:  Negative for abdominal distention, abdominal pain and constipation.  Genitourinary: Negative.   Musculoskeletal: Negative.   Neurological:  Positive for numbness.  Psychiatric/Behavioral:  Negative for behavioral problems and dysphoric mood.      Objective:  BP 99/68   Pulse 98   Temp 98.3 F (36.8 C) (Oral)   Ht 5' 10 (1.778 m)   Wt 133 lb 12.8 oz (60.7 kg)   SpO2 98%   BMI 19.20 kg/m      04/01/2024    2:28 PM 02/28/2024    6:00 AM 02/28/2024    4:00 AM  BP/Weight  Systolic BP 99 138 145  Diastolic BP 68 73 83  Wt. (Lbs) 133.8    BMI 19.2 kg/m2        Physical Exam Constitutional:      Appearance: He is well-developed.  Cardiovascular:     Rate and Rhythm: Normal rate.     Heart sounds: Normal heart sounds. No murmur heard. Pulmonary:     Effort: Pulmonary effort is normal.  Breath sounds: Normal breath sounds. No wheezing or rales.  Chest:     Chest wall: No tenderness.  Abdominal:     General: Bowel sounds are normal. There is no distension.     Palpations: Abdomen is soft. There is no mass.     Tenderness: There is no abdominal tenderness.  Musculoskeletal:        General: Normal range of motion.     Right lower leg: No edema.     Left lower leg: No edema.  Neurological:     Mental Status: He is alert and oriented to person, place, and time.  Psychiatric:        Mood and Affect: Mood normal.        Latest Ref Rng & Units 02/28/2024    7:17 AM 02/28/2024    3:19 AM 02/27/2024   11:11 AM  CMP  Glucose 70 - 99 mg/dL 779  838  873   BUN 6 - 20 mg/dL 11  13  16    Creatinine 0.61 - 1.24 mg/dL 9.23  9.19  9.13   Sodium 135 - 145 mmol/L 136  136  140   Potassium 3.5 - 5.1 mmol/L 3.6  3.5  4.0   Chloride 98 - 111 mmol/L 100  102  108   CO2 22 - 32 mmol/L 23  23  23    Calcium  8.9 - 10.3 mg/dL 8.6  8.5  8.8     Lipid Panel     Component Value Date/Time   CHOL 168 01/11/2023 1007   TRIG 62 01/11/2023 1007   HDL 81 01/11/2023 1007   CHOLHDL 2.8 07/28/2018 1042   VLDL 9 07/28/2018 1042   LDLCALC 75 01/11/2023 1007    CBC    Component Value Date/Time   WBC 13.7 (H) 02/28/2024 0717   RBC 3.39 (L) 02/28/2024 0717   HGB 10.6 (L) 02/28/2024 0717   HCT 31.2 (L) 02/28/2024 0717    PLT 215 02/28/2024 0717   MCV 92.0 02/28/2024 0717   MCH 31.3 02/28/2024 0717   MCHC 34.0 02/28/2024 0717   RDW 12.3 02/28/2024 0717   LYMPHSABS 1.6 02/26/2024 0823   MONOABS 0.6 02/26/2024 0823   EOSABS 0.0 02/26/2024 0823   BASOSABS 0.1 02/26/2024 0823    Lab Results  Component Value Date   HGBA1C 9.0 (H) 02/26/2024    Lab Results  Component Value Date   HGBA1C 9.0 (H) 02/26/2024   HGBA1C 9.7 (A) 01/01/2024   HGBA1C 6.8 04/12/2023      Assessment and Plan Assessment & Plan Type 1 diabetes mellitus complicated by diabetic polyneuropathy Type 1 diabetes with poor glycemic control, A1c of 9, goal is less than 7.0. Neuropathy managed with gabapentin  and marijuana. Hesitant about gabapentin  due to dependency concerns. Uses Lantus  and Novolog  for insulin . Insurance issues with continuous glucose monitoring. - Continue Lantus  18 units subcutaneously in the morning. - Continue Novolog  before meals. - Refilled Lantus  and Novolog  prescriptions. - Ordered AccuCheck Aviva Plus, lancets, and test strips for fingerstick glucose monitoring. - Sent G7 continuous glucose monitor refill to pharmacy. - Scheduled follow-up appointment at the end of January to reassess A1c.  Anxiety disorder Managed with alprazolam  as needed in the past. Interested in duloxetine  for anxiety and neuropathy. Concerned about Xanax  addiction. - Prescribed duloxetine  (Cymbalta ) for anxiety and neuropathy. - Instructed to monitor response to duloxetine  and discontinue if not effective.  Male erectile dysfunction Managed with sildenafil  as needed. Insurance issues with sildenafil  coverage. - Refilled  sildenafil  (Viagra ) prescription.       Meds ordered this encounter  Medications   DULoxetine  (CYMBALTA ) 60 MG capsule    Sig: Take 1 capsule (60 mg total) by mouth daily. For neuropathy and anxiety    Dispense:  30 capsule    Refill:  3   insulin  glargine (LANTUS ) 100 UNIT/ML injection    Sig: Inject 0.18  mLs (18 Units total) into the skin at bedtime.    Dispense:  30 mL    Refill:  6   sildenafil  (VIAGRA ) 100 MG tablet    Sig: Take 1 tablet (100 mg total) by mouth daily as needed for erectile dysfunction. At least 24 hours between doses    Dispense:  10 tablet    Refill:  2   Continuous Glucose Sensor (DEXCOM G7 SENSOR) MISC    Sig: USE TO CHECK BLOOD SUGAR THREE TIMES DAILY AND CHANGE SENSORS ONCE EVERY 10 DAYS    Dispense:  3 each    Refill:  11   Accu-Chek Softclix Lancets lancets    Sig: Use to check blood sugar 3 times daily.    Dispense:  100 each    Refill:  6   glucose blood (ACCU-CHEK GUIDE TEST) test strip    Sig: Use to check blood sugar 3 times daily.    Dispense:  100 each    Refill:  6    Follow-up: Return in about 2 months (around 06/02/2024) for Chronic medical conditions.       Corrina Sabin, MD, FAAFP. [facility] [place], [place] [phone]   04/01/2024, 6:00 PM

## 2024-04-03 ENCOUNTER — Ambulatory Visit: Payer: Self-pay | Admitting: Family Medicine

## 2024-04-03 LAB — MICROALBUMIN / CREATININE URINE RATIO
Creatinine, Urine: 176 mg/dL
Microalb/Creat Ratio: 11 mg/g{creat} (ref 0–29)
Microalbumin, Urine: 19.1 ug/mL

## 2024-04-23 ENCOUNTER — Other Ambulatory Visit: Payer: Self-pay

## 2024-04-27 ENCOUNTER — Emergency Department (HOSPITAL_COMMUNITY)
Admission: EM | Admit: 2024-04-27 | Discharge: 2024-04-27 | Discharge status: Against Medical Advice | Attending: Emergency Medicine | Admitting: Emergency Medicine

## 2024-04-27 ENCOUNTER — Other Ambulatory Visit: Payer: Self-pay

## 2024-04-27 DIAGNOSIS — R1084 Generalized abdominal pain: Secondary | ICD-10-CM | POA: Diagnosis present

## 2024-04-27 DIAGNOSIS — R112 Nausea with vomiting, unspecified: Secondary | ICD-10-CM | POA: Diagnosis not present

## 2024-04-27 DIAGNOSIS — Z5321 Procedure and treatment not carried out due to patient leaving prior to being seen by health care provider: Secondary | ICD-10-CM | POA: Insufficient documentation

## 2024-04-27 LAB — CBC WITH DIFFERENTIAL/PLATELET
Abs Immature Granulocytes: 0.06 K/uL (ref 0.00–0.07)
Basophils Absolute: 0 K/uL (ref 0.0–0.1)
Basophils Relative: 0 %
Eosinophils Absolute: 0 K/uL (ref 0.0–0.5)
Eosinophils Relative: 0 %
HCT: 43.4 % (ref 39.0–52.0)
Hemoglobin: 15.1 g/dL (ref 13.0–17.0)
Immature Granulocytes: 0 %
Lymphocytes Relative: 10 %
Lymphs Abs: 1.4 K/uL (ref 0.7–4.0)
MCH: 31.2 pg (ref 26.0–34.0)
MCHC: 34.8 g/dL (ref 30.0–36.0)
MCV: 89.7 fL (ref 80.0–100.0)
Monocytes Absolute: 0.8 K/uL (ref 0.1–1.0)
Monocytes Relative: 5 %
Neutro Abs: 11.9 K/uL — ABNORMAL HIGH (ref 1.7–7.7)
Neutrophils Relative %: 85 %
Platelets: 276 K/uL (ref 150–400)
RBC: 4.84 MIL/uL (ref 4.22–5.81)
RDW: 11.9 % (ref 11.5–15.5)
WBC: 14.1 K/uL — ABNORMAL HIGH (ref 4.0–10.5)
nRBC: 0 % (ref 0.0–0.2)

## 2024-04-27 LAB — COMPREHENSIVE METABOLIC PANEL WITH GFR
ALT: 61 U/L — ABNORMAL HIGH (ref 0–44)
AST: 40 U/L (ref 15–41)
Albumin: 4.6 g/dL (ref 3.5–5.0)
Alkaline Phosphatase: 87 U/L (ref 38–126)
Anion gap: 19 — ABNORMAL HIGH (ref 5–15)
BUN: 19 mg/dL (ref 6–20)
CO2: 22 mmol/L (ref 22–32)
Calcium: 9.8 mg/dL (ref 8.9–10.3)
Chloride: 96 mmol/L — ABNORMAL LOW (ref 98–111)
Creatinine, Ser: 0.84 mg/dL (ref 0.61–1.24)
GFR, Estimated: >60 mL/min
Glucose, Bld: 331 mg/dL — ABNORMAL HIGH (ref 70–99)
Potassium: 4 mmol/L (ref 3.5–5.1)
Sodium: 136 mmol/L (ref 135–145)
Total Bilirubin: 1.2 mg/dL (ref 0.0–1.2)
Total Protein: 7.9 g/dL (ref 6.5–8.1)

## 2024-04-27 LAB — TROPONIN T, HIGH SENSITIVITY: Troponin T High Sensitivity: 30 ng/L — ABNORMAL HIGH (ref 0–19)

## 2024-04-27 LAB — LIPASE, BLOOD: Lipase: 10 U/L — ABNORMAL LOW (ref 11–51)

## 2024-04-27 MED ORDER — ONDANSETRON 4 MG PO TBDP: 4.0000 mg | ORAL_TABLET | Freq: Once | ORAL | Status: DC

## 2024-04-27 NOTE — ED Notes (Signed)
 Pt up to desk stating he is leaving and is going to take his home meds

## 2024-04-27 NOTE — ED Provider Triage Note (Signed)
 Emergency Medicine Provider Triage Evaluation Note  Shawn Maldonado , a 38 y.o. male  was evaluated in triage.  Pt complains of generalized abdominal pain, NV for 2 days, he appears uncomfortable, he does reports that he has had similar symptoms in the past.  Review of Systems  Positive: Abdominal pain, NV Negative: Fever  Physical Exam  There were no vitals taken for this visit. Gen:   Awake, no distress   Resp:  Normal effort  MSK:   Moves extremities without difficulty  Other:  TTP in RLQ, suprapubic area, LLQ  Medical Decision Making  Medically screening exam initiated at 11:48 AM.  Appropriate orders placed.  Shawn Maldonado was informed that the remainder of the evaluation will be completed by another provider, this initial triage assessment does not replace that evaluation, and the importance of remaining in the ED until their evaluation is complete.     Shawn Warren SAILOR, PA-C 04/27/24 1150

## 2024-04-27 NOTE — ED Triage Notes (Signed)
 PT arrives via EMS from home with complaints of abdominal pain, nausea, and vomiting that began 3 days ago after smoking marijuana.   Glucose 259mg /dL

## 2024-04-28 ENCOUNTER — Other Ambulatory Visit: Payer: Self-pay

## 2024-06-05 ENCOUNTER — Ambulatory Visit: Admitting: Family Medicine
